# Patient Record
Sex: Male | Born: 1945 | Hispanic: No | Smoking: Never smoker
Health system: Southern US, Community
[De-identification: ages and names within clinical notes are randomized; demographics above are authoritative.]

## PROBLEM LIST (undated history)

## (undated) DIAGNOSIS — I219 Acute myocardial infarction, unspecified: Secondary | ICD-10-CM

## (undated) DIAGNOSIS — R339 Retention of urine, unspecified: Secondary | ICD-10-CM

## (undated) DIAGNOSIS — R7303 Prediabetes: Secondary | ICD-10-CM

## (undated) DIAGNOSIS — I10 Essential (primary) hypertension: Secondary | ICD-10-CM

## (undated) DIAGNOSIS — Z960 Presence of urogenital implants: Secondary | ICD-10-CM

## (undated) DIAGNOSIS — I639 Cerebral infarction, unspecified: Secondary | ICD-10-CM

## (undated) DIAGNOSIS — I2699 Other pulmonary embolism without acute cor pulmonale: Secondary | ICD-10-CM

## (undated) DIAGNOSIS — I679 Cerebrovascular disease, unspecified: Secondary | ICD-10-CM

## (undated) DIAGNOSIS — E119 Type 2 diabetes mellitus without complications: Secondary | ICD-10-CM

## (undated) DIAGNOSIS — E785 Hyperlipidemia, unspecified: Secondary | ICD-10-CM

## (undated) DIAGNOSIS — C61 Malignant neoplasm of prostate: Secondary | ICD-10-CM

## (undated) DIAGNOSIS — I447 Left bundle-branch block, unspecified: Secondary | ICD-10-CM

## (undated) DIAGNOSIS — Z978 Presence of other specified devices: Secondary | ICD-10-CM

## (undated) DIAGNOSIS — G459 Transient cerebral ischemic attack, unspecified: Secondary | ICD-10-CM

## (undated) DIAGNOSIS — Z8619 Personal history of other infectious and parasitic diseases: Secondary | ICD-10-CM

## (undated) DIAGNOSIS — D696 Thrombocytopenia, unspecified: Secondary | ICD-10-CM

## (undated) DIAGNOSIS — G9341 Metabolic encephalopathy: Secondary | ICD-10-CM

## (undated) DIAGNOSIS — R519 Headache, unspecified: Secondary | ICD-10-CM

## (undated) DIAGNOSIS — C641 Malignant neoplasm of right kidney, except renal pelvis: Secondary | ICD-10-CM

## (undated) HISTORY — DX: Hyperlipidemia, unspecified: E78.5

## (undated) HISTORY — DX: Metabolic encephalopathy: G93.41

## (undated) HISTORY — DX: Transient cerebral ischemic attack, unspecified: G45.9

## (undated) HISTORY — DX: Other pulmonary embolism without acute cor pulmonale: I26.99

## (undated) HISTORY — PX: NO PAST SURGERIES: SHX2092

## (undated) HISTORY — DX: Cerebrovascular disease, unspecified: I67.9

## (undated) HISTORY — PX: TONSILLECTOMY: SUR1361

---

## 2009-05-26 ENCOUNTER — Emergency Department (HOSPITAL_COMMUNITY): Admission: EM | Admit: 2009-05-26 | Discharge: 2009-05-26 | Payer: Self-pay | Admitting: Emergency Medicine

## 2009-05-27 ENCOUNTER — Emergency Department (HOSPITAL_COMMUNITY): Admission: EM | Admit: 2009-05-27 | Discharge: 2009-05-27 | Payer: Self-pay | Admitting: Emergency Medicine

## 2010-08-29 LAB — CBC
MCHC: 31.9 g/dL (ref 30.0–36.0)
RBC: 6.27 MIL/uL — ABNORMAL HIGH (ref 4.22–5.81)
WBC: 5.5 10*3/uL (ref 4.0–10.5)

## 2010-08-29 LAB — DIFFERENTIAL
Basophils Relative: 1 % (ref 0–1)
Lymphocytes Relative: 20 % (ref 12–46)
Monocytes Relative: 6 % (ref 3–12)
Neutro Abs: 4 10*3/uL (ref 1.7–7.7)
Neutrophils Relative %: 73 % (ref 43–77)

## 2010-08-29 LAB — URINALYSIS, ROUTINE W REFLEX MICROSCOPIC
Bilirubin Urine: NEGATIVE
Glucose, UA: NEGATIVE mg/dL
Ketones, ur: NEGATIVE mg/dL
Specific Gravity, Urine: 1.015 (ref 1.005–1.030)
pH: 7.5 (ref 5.0–8.0)

## 2010-08-29 LAB — POCT I-STAT, CHEM 8
Glucose, Bld: 129 mg/dL — ABNORMAL HIGH (ref 70–99)
HCT: 52 % (ref 39.0–52.0)
Hemoglobin: 17.7 g/dL — ABNORMAL HIGH (ref 13.0–17.0)
Potassium: 7.6 mEq/L (ref 3.5–5.1)
Sodium: 135 mEq/L (ref 135–145)

## 2010-08-29 LAB — URINE MICROSCOPIC-ADD ON

## 2014-11-30 ENCOUNTER — Emergency Department (HOSPITAL_COMMUNITY)
Admission: EM | Admit: 2014-11-30 | Discharge: 2014-11-30 | Disposition: A | Discharge status: Home / Self Care | Payer: Medicare Other | Attending: Emergency Medicine | Admitting: Emergency Medicine

## 2014-11-30 ENCOUNTER — Encounter (HOSPITAL_COMMUNITY): Payer: Self-pay | Admitting: Emergency Medicine

## 2014-11-30 DIAGNOSIS — R109 Unspecified abdominal pain: Secondary | ICD-10-CM | POA: Diagnosis not present

## 2014-11-30 DIAGNOSIS — I10 Essential (primary) hypertension: Secondary | ICD-10-CM | POA: Diagnosis not present

## 2014-11-30 DIAGNOSIS — R110 Nausea: Secondary | ICD-10-CM | POA: Insufficient documentation

## 2014-11-30 DIAGNOSIS — Z79899 Other long term (current) drug therapy: Secondary | ICD-10-CM | POA: Insufficient documentation

## 2014-11-30 DIAGNOSIS — R339 Retention of urine, unspecified: Secondary | ICD-10-CM | POA: Insufficient documentation

## 2014-11-30 DIAGNOSIS — R61 Generalized hyperhidrosis: Secondary | ICD-10-CM | POA: Diagnosis not present

## 2014-11-30 DIAGNOSIS — Z7982 Long term (current) use of aspirin: Secondary | ICD-10-CM | POA: Insufficient documentation

## 2014-11-30 HISTORY — DX: Essential (primary) hypertension: I10

## 2014-11-30 LAB — URINALYSIS, ROUTINE W REFLEX MICROSCOPIC
BILIRUBIN URINE: NEGATIVE
Glucose, UA: NEGATIVE mg/dL
KETONES UR: NEGATIVE mg/dL
Leukocytes, UA: NEGATIVE
NITRITE: NEGATIVE
SPECIFIC GRAVITY, URINE: 1.015 (ref 1.005–1.030)
UROBILINOGEN UA: 0.2 mg/dL (ref 0.0–1.0)
pH: 5.5 (ref 5.0–8.0)

## 2014-11-30 LAB — CBC WITH DIFFERENTIAL/PLATELET
BASOS PCT: 0 % (ref 0–1)
Basophils Absolute: 0 10*3/uL (ref 0.0–0.1)
EOS PCT: 0 % (ref 0–5)
Eosinophils Absolute: 0 10*3/uL (ref 0.0–0.7)
HCT: 46.3 % (ref 39.0–52.0)
HEMOGLOBIN: 15.6 g/dL (ref 13.0–17.0)
LYMPHS ABS: 0.9 10*3/uL (ref 0.7–4.0)
Lymphocytes Relative: 8 % — ABNORMAL LOW (ref 12–46)
MCH: 23.4 pg — AB (ref 26.0–34.0)
MCHC: 33.7 g/dL (ref 30.0–36.0)
MCV: 69.3 fL — AB (ref 78.0–100.0)
Monocytes Absolute: 0.5 10*3/uL (ref 0.1–1.0)
Monocytes Relative: 5 % (ref 3–12)
NEUTROS ABS: 9.5 10*3/uL — AB (ref 1.7–7.7)
NEUTROS PCT: 87 % — AB (ref 43–77)
PLATELETS: 203 10*3/uL (ref 150–400)
RBC: 6.68 MIL/uL — ABNORMAL HIGH (ref 4.22–5.81)
RDW: 14.2 % (ref 11.5–15.5)
WBC: 10.9 10*3/uL — ABNORMAL HIGH (ref 4.0–10.5)

## 2014-11-30 LAB — URINE MICROSCOPIC-ADD ON

## 2014-11-30 LAB — BASIC METABOLIC PANEL
Anion gap: 14 (ref 5–15)
BUN: 12 mg/dL (ref 6–20)
CO2: 21 mmol/L — ABNORMAL LOW (ref 22–32)
Calcium: 9.4 mg/dL (ref 8.9–10.3)
Chloride: 97 mmol/L — ABNORMAL LOW (ref 101–111)
Creatinine, Ser: 1.1 mg/dL (ref 0.61–1.24)
GFR calc Af Amer: >60 mL/min (ref >60)
Glucose, Bld: 182 mg/dL — ABNORMAL HIGH (ref 65–99)
POTASSIUM: 3.8 mmol/L (ref 3.5–5.1)
SODIUM: 132 mmol/L — AB (ref 135–145)

## 2014-11-30 NOTE — ED Notes (Signed)
Escorted patient to restroom.  Patient refused wheelchair.

## 2014-11-30 NOTE — ED Notes (Signed)
MD at bedside. Attempt bladder scan. Patient unable to lay flat for long periods of time do to increase urgency to urinate.

## 2014-11-30 NOTE — ED Notes (Addendum)
Pt st's he is having urinary retention,  St's has been drinking a lot of water but only urinates small amount.  Pt st's he last emptied his bladder yesterday.  Pt st's he has  Been out of B/P meds x's 5 days

## 2014-11-30 NOTE — Discharge Instructions (Signed)
Results for orders placed or performed during the hospital encounter of 11/30/14 (from the past 24 hour(s))  CBC with Differential     Status: Abnormal   Collection Time: 11/30/14  7:17 PM  Result Value Ref Range   WBC 10.9 (H) 4.0 - 10.5 K/uL   RBC 6.68 (H) 4.22 - 5.81 MIL/uL   Hemoglobin 15.6 13.0 - 17.0 g/dL   HCT 46.3 39.0 - 52.0 %   MCV 69.3 (L) 78.0 - 100.0 fL   MCH 23.4 (L) 26.0 - 34.0 pg   MCHC 33.7 30.0 - 36.0 g/dL   RDW 14.2 11.5 - 15.5 %   Platelets 203 150 - 400 K/uL   Neutrophils Relative % 87 (H) 43 - 77 %   Lymphocytes Relative 8 (L) 12 - 46 %   Monocytes Relative 5 3 - 12 %   Eosinophils Relative 0 0 - 5 %   Basophils Relative 0 0 - 1 %   Neutro Abs 9.5 (H) 1.7 - 7.7 K/uL   Lymphs Abs 0.9 0.7 - 4.0 K/uL   Monocytes Absolute 0.5 0.1 - 1.0 K/uL   Eosinophils Absolute 0.0 0.0 - 0.7 K/uL   Basophils Absolute 0.0 0.0 - 0.1 K/uL   Smear Review MORPHOLOGY UNREMARKABLE   Basic metabolic panel     Status: Abnormal   Collection Time: 11/30/14  7:17 PM  Result Value Ref Range   Sodium 132 (L) 135 - 145 mmol/L   Potassium 3.8 3.5 - 5.1 mmol/L   Chloride 97 (L) 101 - 111 mmol/L   CO2 21 (L) 22 - 32 mmol/L   Glucose, Bld 182 (H) 65 - 99 mg/dL   BUN 12 6 - 20 mg/dL   Creatinine, Ser 1.10 0.61 - 1.24 mg/dL   Calcium 9.4 8.9 - 10.3 mg/dL   GFR calc non Af Amer >60 >60 mL/min   GFR calc Af Amer >60 >60 mL/min   Anion gap 14 5 - 15  Urinalysis, Routine w reflex microscopic (not at [facility])     Status: Abnormal   Collection Time: 11/30/14  7:42 PM  Result Value Ref Range   Color, Urine YELLOW YELLOW   APPearance CLEAR CLEAR   Specific Gravity, Urine 1.015 1.005 - 1.030   pH 5.5 5.0 - 8.0   Glucose, UA NEGATIVE NEGATIVE mg/dL   Hgb urine dipstick LARGE (A) NEGATIVE   Bilirubin Urine NEGATIVE NEGATIVE   Ketones, ur NEGATIVE NEGATIVE mg/dL   Protein, ur >300 (A) NEGATIVE mg/dL   Urobilinogen, UA 0.2 0.0 - 1.0 mg/dL   Nitrite NEGATIVE NEGATIVE   Leukocytes, UA NEGATIVE  NEGATIVE  Urine microscopic-add on     Status: None   Collection Time: 11/30/14  7:42 PM  Result Value Ref Range   Squamous Epithelial / LPF RARE RARE   WBC, UA 0-2 <3 WBC/hpf   RBC / HPF 21-50 <3 RBC/hpf   Bacteria, UA RARE RARE   Urine-Other AMORPHOUS URATES/PHOSPHATES       Acute Urinary Retention Acute urinary retention is the temporary inability to urinate. This is a common problem in older men. As men age their prostates become larger and block the flow of urine from the bladder. This is usually a problem that has come on gradually.  HOME CARE INSTRUCTIONS If you are sent home with a Foley catheter and a drainage system, you will need to discuss the best course of action with your health care provider. While the catheter is in, maintain a good intake of  fluids. Keep the drainage bag emptied and lower than your catheter. This is so that contaminated urine will not flow back into your bladder, which could lead to a urinary tract infection. There are two main types of drainage bags. One is a large bag that usually is used at night. It has a good capacity that will allow you to sleep through the night without having to empty it. The second type is called a leg bag. It has a smaller capacity, so it needs to be emptied more frequently. However, the main advantage is that it can be attached by a leg strap and can go underneath your clothing, allowing you the freedom to move about or leave your home. Only take over-the-counter or prescription medicines for pain, discomfort, or fever as directed by your health care provider.  SEEK MEDICAL CARE IF:  You develop a low-grade fever.  You experience spasms or leakage of urine with the spasms. SEEK IMMEDIATE MEDICAL CARE IF:   You develop chills or fever.  Your catheter stops draining urine.  Your catheter falls out.  You start to develop increased bleeding that does not respond to rest and increased fluid intake. MAKE SURE  YOU:  Understand these instructions.  Will watch your condition.  Will get help right away if you are not doing well or get worse. Document Released: 08/21/2000 Document Revised: 05/20/2013 Document Reviewed: 10/24/2012 [facility] Patient Information 2015 [place], [place]. This information is not intended to replace advice given to you by your health care provider. Make sure you discuss any questions you have with your health care provider.

## 2014-11-30 NOTE — ED Notes (Signed)
Pt given wheelchair by this RN and escorted to triage waiting area by Idalia Needle, EMT Pt refused wheelchair and states he feels better standing up. This RN explained to pt that due to stating he was dizzy upon arrival that wheelchair was safest option. Pt got up out of wheelchair and ambulated around waiting area still.

## 2014-11-30 NOTE — ED Notes (Signed)
Bladder scan performed with a potential of 783 cc or more of urine retention.

## 2014-11-30 NOTE — ED Provider Notes (Signed)
CSN: [id number]     Arrival date & time 11/30/14  1752 History   First MD Initiated Contact with Patient 11/30/14 1810     Chief Complaint  Patient presents with  . Urinary Retention    (Consider location/radiation/quality/duration/timing/severity/associated sxs/prior Treatment) Patient is a 69 y.o. male presenting with dysuria. The history is provided by the patient.  Dysuria This is a new (inability to void) problem. The current episode started today. The problem occurs constantly. The problem has been unchanged. Associated symptoms include abdominal pain, chills, diaphoresis, nausea and urinary symptoms. Pertinent negatives include no arthralgias, change in bowel habit, chest pain, congestion, coughing, fatigue, fever, headaches, myalgias, numbness, visual change or vomiting. Nothing aggravates the symptoms. He has tried nothing for the symptoms.    Past Medical History  Diagnosis Date  . Hypertension    History reviewed. No pertinent past surgical history. No family history on file. History  Substance Use Topics  . Smoking status: Never Smoker   . Smokeless tobacco: Not on file  . Alcohol Use: No    Review of Systems  Constitutional: Positive for chills and diaphoresis. Negative for fever and fatigue.  HENT: Negative for congestion.   Respiratory: Negative for cough, chest tightness and shortness of breath.   Cardiovascular: Negative for chest pain.  Gastrointestinal: Positive for nausea, abdominal pain and abdominal distention. Negative for vomiting and change in bowel habit.  Genitourinary: Positive for decreased urine volume and difficulty urinating. Negative for urgency, frequency, hematuria, flank pain, discharge, penile swelling, penile pain and testicular pain.  Musculoskeletal: Negative for myalgias and arthralgias.  Neurological: Negative for light-headedness, numbness and headaches.  Psychiatric/Behavioral: Negative for confusion.  All other systems reviewed and are  negative.     Allergies  Review of patient's allergies indicates no known allergies.  Home Medications   Prior to Admission medications   Medication Sig Start Date End Date Taking? Authorizing Provider  amLODipine (NORVASC) 10 MG tablet Take 5 mg by mouth daily.   Yes Historical Provider, MD  aspirin EC 81 MG tablet Take 162 mg by mouth daily.   Yes Historical Provider, MD  hydrochlorothiazide (HYDRODIURIL) 25 MG tablet Take 25 mg by mouth daily.   Yes Historical Provider, MD  Multiple Vitamin (MULTIVITAMIN WITH MINERALS) TABS tablet Take 1 tablet by mouth daily.   Yes Historical Provider, MD   BP 183/113 mmHg  Pulse 112  Temp(Src) 98 F (36.7 C) (Oral)  Resp 20  Ht 6' (1.829 m)  Wt 210 lb (95.255 kg)  BMI 28.47 kg/m2  SpO2 98%  Filed Vitals:   11/30/14 2045 11/30/14 2100 11/30/14 2108 11/30/14 2211  BP: 170/95 171/93 179/95 179/89  Pulse: 94 92    Temp:    98.3 F (36.8 C)  TempSrc:   Oral   Resp: 18 9 22    Height:      Weight:      SpO2: 96% 97% 99%       Physical Exam  Constitutional: He is oriented to person, place, and time. He appears well-developed and well-nourished. No distress.  HENT:  Head: Normocephalic and atraumatic.  Nose: Nose normal.  Mouth/Throat: Oropharynx is clear and moist. No oropharyngeal exudate.  Eyes: EOM are normal. Pupils are equal, round, and reactive to light.  Neck: Normal range of motion. Neck supple.  Cardiovascular: Normal rate, regular rhythm, normal heart sounds and intact distal pulses.   No murmur heard. Pulmonary/Chest: Effort normal and breath sounds normal. No respiratory distress. He has no wheezes.  He exhibits no tenderness.  Abdominal: Soft. He exhibits distension (lower abd). There is tenderness. There is no rebound and no guarding.  Bladder palpable just under umbilicus and tender.  Upper abdomen soft and nondistended.  No CVA tenderness b/l.    Genitourinary: Prostate normal and penis normal. No penile tenderness.   Normal male circumcised penis.  No lesions, nontender.  Normal testicular lie.   Musculoskeletal: Normal range of motion. He exhibits no tenderness.  Neurological: He is alert and oriented to person, place, and time. No cranial nerve deficit. Coordination normal.  Skin: Skin is warm and dry. He is not diaphoretic. No pallor.  Psychiatric: He has a normal mood and affect. His behavior is normal. Judgment and thought content normal.  Nursing note and vitals reviewed.   ED Course  Procedures (including critical care time) Labs Review Labs Reviewed  URINALYSIS, ROUTINE W REFLEX MICROSCOPIC (NOT AT [facility]) - Abnormal; Notable for the following:    Hgb urine dipstick LARGE (*)    Protein, ur >300 (*)    All other components within normal limits  CBC WITH DIFFERENTIAL/PLATELET - Abnormal; Notable for the following:    WBC 10.9 (*)    RBC 6.68 (*)    MCV 69.3 (*)    MCH 23.4 (*)    Neutrophils Relative % 87 (*)    Lymphocytes Relative 8 (*)    Neutro Abs 9.5 (*)    All other components within normal limits  BASIC METABOLIC PANEL - Abnormal; Notable for the following:    Sodium 132 (*)    Chloride 97 (*)    CO2 21 (*)    Glucose, Bld 182 (*)    All other components within normal limits  URINE CULTURE  URINE MICROSCOPIC-ADD ON    Imaging Review No results found.   EKG Interpretation   Date/Time:  Monday November 30 2014 18:36:02 EDT Ventricular Rate:  102 PR Interval:  203 QRS Duration: 145 QT Interval:  367 QTC Calculation: 478 R Axis:   -71 Text Interpretation:  Sinus tachycardia Probable left atrial enlargement  Left bundle branch block Inferior infarct, acute Lateral leads are also  involved No significant change since last tracing Confirmed by Maryan Rued   MD, Loree Fee ([id number]) on 11/30/2014 6:41:40 PM      MDM   Final diagnoses:  Urinary retention    Pt is a 69 yo M with hx of HTN who presents with urinary retention.  No hx of UTIs, STDs, or prostate pathology in the  past.  Has had intermittent urinary retention in the past that lasted for a few hours but never prolonged.  Now has not urinated in 24 hours despite the sensation of needing to urinate and lower abdominal distension.  Has had a small amount of urinary incontinence into his underwear this afternoon.  Denies nausea, vomiting, fevers, myalgias.   Bladder palpable to almost his umbilicus.  Tender and distended inferior abdomen.   Prostate exam benign.   UA with no signs of UTI.   Patient tried to urinate but was still unable, so a foley catheter was placed.  > 1L of urine was voided immediately.  Patient felt much improved immediately after.  His hypertension improved, he was no longer tachycardic or tahcypneic, and abdominal pain resolved.  He was taught how to use the bag and instructed to f/u with PCP in 1 week.  All questions were answered and ED return precautions were discussed prior to dc home in stable condition.  If performed, labs, EKGs, and imaging were reviewed and interpreted by myself and my attending, and incorporated in the medical decision making.  Patient was seen with ED Attending, Dr. Dorma Russell, MD   Tori Milks, MD 12/02/14 [place], MD 12/03/14 1538

## 2014-12-02 ENCOUNTER — Emergency Department (HOSPITAL_COMMUNITY)
Admission: EM | Admit: 2014-12-02 | Discharge: 2014-12-02 | Disposition: A | Discharge status: Home / Self Care | Payer: BLUE CROSS/BLUE SHIELD | Attending: Emergency Medicine | Admitting: Emergency Medicine

## 2014-12-02 ENCOUNTER — Encounter (HOSPITAL_COMMUNITY): Payer: Self-pay | Admitting: Cardiology

## 2014-12-02 DIAGNOSIS — R339 Retention of urine, unspecified: Secondary | ICD-10-CM | POA: Diagnosis not present

## 2014-12-02 DIAGNOSIS — Z79899 Other long term (current) drug therapy: Secondary | ICD-10-CM | POA: Insufficient documentation

## 2014-12-02 DIAGNOSIS — R3919 Other difficulties with micturition: Secondary | ICD-10-CM | POA: Insufficient documentation

## 2014-12-02 DIAGNOSIS — I10 Essential (primary) hypertension: Secondary | ICD-10-CM | POA: Diagnosis not present

## 2014-12-02 DIAGNOSIS — Z7982 Long term (current) use of aspirin: Secondary | ICD-10-CM | POA: Insufficient documentation

## 2014-12-02 DIAGNOSIS — Z466 Encounter for fitting and adjustment of urinary device: Secondary | ICD-10-CM | POA: Diagnosis present

## 2014-12-02 LAB — URINE CULTURE: Culture: NO GROWTH

## 2014-12-02 NOTE — ED Notes (Signed)
Pt reports he was here 4th of July and had a foley placed. States he wants the foley removed and needs a urology appt made.

## 2014-12-02 NOTE — ED Notes (Signed)
Pt here for removal of Foley cath which was placed 11/30/14 in the ED. Went to [place] for follow up yesterday. They did not want to remove it since it had been put in here.

## 2014-12-02 NOTE — ED Provider Notes (Signed)
CSN: [id number]     Arrival date & time 12/02/14  1355 History  This chart was scribed for non-physician practitioner,Hope M. Janit Bern, NP, working with Pattricia Boss, MD, by Helane Gunther ED Scribe. This patient was seen in room TR03C/TR03C and the patient's care was started at 2:31 PM    Chief Complaint  Patient presents with  . remove foley    remove foley   The history is provided by the patient. No language interpreter was used.   HPI Comments: John Shields is a 69 y.o. male who presents to the Emergency Department to have his foley catheter removed. He reports coming in on 10/31/2014 when he was unable to void, and received a catheter, which provided immediate relief. He denies receiving any medications.   Past Medical History  Diagnosis Date  . Hypertension    History reviewed. No pertinent past surgical history. History reviewed. No pertinent family history. History  Substance Use Topics  . Smoking status: Never Smoker   . Smokeless tobacco: Not on file  . Alcohol Use: No    Review of Systems  Constitutional: Negative for fever.  Genitourinary: Positive for difficulty urinating.  Skin: Negative for wound.  All other systems reviewed and are negative.   Allergies  Review of patient's allergies indicates no known allergies.  Home Medications   Prior to Admission medications   Medication Sig Start Date End Date Taking? Authorizing Provider  amLODipine (NORVASC) 10 MG tablet Take 5 mg by mouth daily.    Historical Provider, MD  aspirin EC 81 MG tablet Take 162 mg by mouth daily.    Historical Provider, MD  hydrochlorothiazide (HYDRODIURIL) 25 MG tablet Take 25 mg by mouth daily.    Historical Provider, MD  Multiple Vitamin (MULTIVITAMIN WITH MINERALS) TABS tablet Take 1 tablet by mouth daily.    Historical Provider, MD   BP 136/89 mmHg  Pulse 95  Temp(Src) 98.1 F (36.7 C) (Oral)  Resp 16  Wt 210 lb (95.255 kg)  SpO2 95% Physical Exam  Constitutional: He is  oriented to person, place, and time. He appears well-developed and well-nourished. No distress.  HENT:  Head: Normocephalic and atraumatic.  Mouth/Throat: Oropharynx is clear and moist.  Eyes: EOM are normal.  Neck: Neck supple. No tracheal deviation present.  Cardiovascular: Normal rate.   Pulmonary/Chest: Effort normal and breath sounds normal.  Musculoskeletal: Normal range of motion.  Neurological: He is alert and oriented to person, place, and time.  Psychiatric: He has a normal mood and affect. His behavior is normal.  Nursing note and vitals reviewed.   ED Course  Procedures  DIAGNOSTIC STUDIES: Oxygen Saturation is 95% on RA, adequate by my interpretation.    COORDINATION OF CARE: 2:34 PM - Discussed plans to refer to a Urologist. Pt advised of plan for treatment and pt agrees. Dr. Jeanell Sparrow in to see the patient and discuss plan of care.  2:44 PM - Discussed plans to wait on catheter removal until pt has seen a urologist with Dr Jeanell Sparrow and pt. Pt advised of plan, and pt agrees. Appointment made for patient with [facility] and patient will leave catheter in until that time.   MDM  69 y.o. male with urinary retention and foley catheter in place. Stable for d/c to follow up with urology.  Final diagnoses:  Urinary retention   I personally performed the services described in this documentation, which was scribed in my presence. The recorded information has been reviewed and is accurate.  [address] [place], [place] 12/03/14 [place], MD 12/04/14 [phone]

## 2014-12-06 ENCOUNTER — Encounter (HOSPITAL_COMMUNITY): Payer: Self-pay | Admitting: Emergency Medicine

## 2014-12-06 ENCOUNTER — Emergency Department (HOSPITAL_COMMUNITY)
Admission: EM | Admit: 2014-12-06 | Discharge: 2014-12-07 | Disposition: A | Discharge status: Home / Self Care | Payer: BLUE CROSS/BLUE SHIELD | Attending: Emergency Medicine | Admitting: Emergency Medicine

## 2014-12-06 DIAGNOSIS — Z7982 Long term (current) use of aspirin: Secondary | ICD-10-CM | POA: Diagnosis not present

## 2014-12-06 DIAGNOSIS — R319 Hematuria, unspecified: Secondary | ICD-10-CM | POA: Diagnosis not present

## 2014-12-06 DIAGNOSIS — I10 Essential (primary) hypertension: Secondary | ICD-10-CM | POA: Diagnosis not present

## 2014-12-06 DIAGNOSIS — Z79899 Other long term (current) drug therapy: Secondary | ICD-10-CM | POA: Diagnosis not present

## 2014-12-06 NOTE — ED Notes (Signed)
Rob, [place], states pt not appropriate for fast track .  Per Raquel Sarna, charge RN, place pt in waiting room for acute room.

## 2014-12-06 NOTE — ED Notes (Signed)
Pt states he had foley placed here on 7/4 and is suppose to follow-up with urologist tomorrow.  Reports it will no longer drain due to blood clots.  Pt states he is leaking urine/blood from around foley.

## 2014-12-07 DIAGNOSIS — R310 Gross hematuria: Secondary | ICD-10-CM | POA: Diagnosis not present

## 2014-12-07 DIAGNOSIS — N3001 Acute cystitis with hematuria: Secondary | ICD-10-CM | POA: Diagnosis not present

## 2014-12-07 DIAGNOSIS — K921 Melena: Secondary | ICD-10-CM | POA: Diagnosis not present

## 2014-12-07 NOTE — ED Provider Notes (Signed)
CSN: [id number]     Arrival date & time 12/06/14  2208 History  This chart was scribed for John Schmidt, MD by Julien Nordmann, ED Scribe. This patient was seen in room A07C/A07C and the patient's care was started at 1:10 AM.    Chief Complaint  Patient presents with  . Hematuria       The history is provided by the patient. No language interpreter was used.   HPI Comments: John Shields is a 69 y.o. male who presents to the Emergency Department complaining of gradual worsening hematuria onset 6 hours ago. He has associated "tip" sensitivity. Pt has had a foley catheter for the past week due to not being able to pass much urine. Pt says he started dripping again today which prompted him to come to the ED before having hematuria. He notes having a follow up appointment with a urologist tomorrow. Pt denies distension, fevers, chills, nausea, and vomiting.  Past Medical History  Diagnosis Date  . Hypertension    History reviewed. No pertinent past surgical history. No family history on file. History  Substance Use Topics  . Smoking status: Never Smoker   . Smokeless tobacco: Not on file  . Alcohol Use: No    Review of Systems  A complete 10 system review of systems was obtained and all systems are negative except as noted in the HPI and PMH.    Allergies  Review of patient's allergies indicates no known allergies.  Home Medications   Prior to Admission medications   Medication Sig Start Date End Date Taking? Authorizing Provider  amLODipine (NORVASC) 10 MG tablet Take 5 mg by mouth daily.    Historical Provider, MD  aspirin EC 81 MG tablet Take 162 mg by mouth daily.    Historical Provider, MD  hydrochlorothiazide (HYDRODIURIL) 25 MG tablet Take 25 mg by mouth daily.    Historical Provider, MD  Multiple Vitamin (MULTIVITAMIN WITH MINERALS) TABS tablet Take 1 tablet by mouth daily.    Historical Provider, MD   Triage vitals: BP 174/103 mmHg  Pulse 80  Temp(Src) 98.2 F (36.8  C) (Oral)  Resp 18  SpO2 100% Physical Exam  Constitutional: He is oriented to person, place, and time. He appears well-developed and well-nourished.  HENT:  Head: Normocephalic and atraumatic.  Eyes: EOM are normal.  Neck: Normal range of motion.  Cardiovascular: Normal rate, regular rhythm, normal heart sounds and intact distal pulses.   Pulmonary/Chest: Effort normal and breath sounds normal. No respiratory distress.  Abdominal: Soft. He exhibits no distension. There is no tenderness.  Genitourinary:  Normal circumcised penis.  Small amount of blood at the meatus.  64 French catheter in place and it appears clotted with blood.  Hematuria noted in catheter bag  Musculoskeletal: Normal range of motion.  Neurological: He is alert and oriented to person, place, and time.  Skin: Skin is warm and dry.  Psychiatric: He has a normal mood and affect. Judgment normal.  Nursing note and vitals reviewed.   ED Course  Procedures  DIAGNOSTIC STUDIES: Oxygen Saturation is 100% on RA, normal by my interpretation.  COORDINATION OF CARE: 1:14 AM Discussed treatment plan which includes removal of catheter, urinate with pt at bedside and pt agreed to plan. 2:10 AM Pt urinated and would like to go home. Pt is to follow up with his urologist tomorrow.  Labs Review Labs Reviewed - No data to display  Imaging Review No results found.   EKG Interpretation None  MDM   Final diagnoses:  Hematuria    Patient prefers to have his catheter out at this time.  I suspect that his catheters obstructed.  He had urinary retention last week.  Has not had his catheter in for 1 week.  Catheter was removed at the bedside.  Patient was kept in the emergency department.  He drank fluids.  His been able to urinate freely on his own.  He stills having some hematuria.  He understands he has the potential to clot off his bladder and have recurrent urinary retention.  He will return to the ER if this is the  case.  Otherwise he will follow-up with urology in the morning as scheduled.  No fevers or chills.  No dysuria.  Doubt urinary tract infection.  I personally performed the services described in this documentation, which was scribed in my presence. The recorded information has been reviewed and is accurate.     John Schmidt, MD 12/07/14 [phone]

## 2014-12-07 NOTE — ED Notes (Signed)
Pt given ice water and urinal and encouraged to urinate

## 2014-12-14 DIAGNOSIS — R339 Retention of urine, unspecified: Secondary | ICD-10-CM | POA: Diagnosis not present

## 2014-12-21 DIAGNOSIS — R339 Retention of urine, unspecified: Secondary | ICD-10-CM | POA: Diagnosis not present

## 2014-12-22 DIAGNOSIS — R300 Dysuria: Secondary | ICD-10-CM | POA: Diagnosis not present

## 2014-12-22 DIAGNOSIS — R350 Frequency of micturition: Secondary | ICD-10-CM | POA: Diagnosis not present

## 2014-12-22 DIAGNOSIS — R339 Retention of urine, unspecified: Secondary | ICD-10-CM | POA: Diagnosis not present

## 2014-12-22 DIAGNOSIS — R312 Other microscopic hematuria: Secondary | ICD-10-CM | POA: Diagnosis not present

## 2015-01-05 DIAGNOSIS — R339 Retention of urine, unspecified: Secondary | ICD-10-CM | POA: Diagnosis not present

## 2015-01-05 DIAGNOSIS — R350 Frequency of micturition: Secondary | ICD-10-CM | POA: Diagnosis not present

## 2015-01-05 DIAGNOSIS — R312 Other microscopic hematuria: Secondary | ICD-10-CM | POA: Diagnosis not present

## 2015-04-02 DIAGNOSIS — R350 Frequency of micturition: Secondary | ICD-10-CM | POA: Diagnosis not present

## 2015-04-02 DIAGNOSIS — R339 Retention of urine, unspecified: Secondary | ICD-10-CM | POA: Diagnosis not present

## 2015-04-09 DIAGNOSIS — R3915 Urgency of urination: Secondary | ICD-10-CM | POA: Diagnosis not present

## 2015-04-09 DIAGNOSIS — R350 Frequency of micturition: Secondary | ICD-10-CM | POA: Diagnosis not present

## 2015-04-09 DIAGNOSIS — N401 Enlarged prostate with lower urinary tract symptoms: Secondary | ICD-10-CM | POA: Diagnosis not present

## 2015-04-09 DIAGNOSIS — R972 Elevated prostate specific antigen [PSA]: Secondary | ICD-10-CM | POA: Diagnosis not present

## 2015-08-27 ENCOUNTER — Emergency Department (HOSPITAL_COMMUNITY)
Admission: EM | Admit: 2015-08-27 | Discharge: 2015-08-27 | Disposition: A | Discharge status: Home / Self Care | Payer: Medicare Other | Attending: Emergency Medicine | Admitting: Emergency Medicine

## 2015-08-27 ENCOUNTER — Encounter (HOSPITAL_COMMUNITY): Payer: Self-pay | Admitting: *Deleted

## 2015-08-27 DIAGNOSIS — I10 Essential (primary) hypertension: Secondary | ICD-10-CM | POA: Diagnosis not present

## 2015-08-27 DIAGNOSIS — Z7982 Long term (current) use of aspirin: Secondary | ICD-10-CM | POA: Insufficient documentation

## 2015-08-27 DIAGNOSIS — R51 Headache: Secondary | ICD-10-CM | POA: Insufficient documentation

## 2015-08-27 DIAGNOSIS — Z79899 Other long term (current) drug therapy: Secondary | ICD-10-CM | POA: Insufficient documentation

## 2015-08-27 LAB — I-STAT CHEM 8, ED
BUN: 21 mg/dL — AB (ref 6–20)
CALCIUM ION: 1.15 mmol/L (ref 1.13–1.30)
CREATININE: 0.9 mg/dL (ref 0.61–1.24)
Chloride: 101 mmol/L (ref 101–111)
Glucose, Bld: 96 mg/dL (ref 65–99)
HCT: 53 % — ABNORMAL HIGH (ref 39.0–52.0)
Hemoglobin: 18 g/dL — ABNORMAL HIGH (ref 13.0–17.0)
Potassium: 4.4 mmol/L (ref 3.5–5.1)
Sodium: 140 mmol/L (ref 135–145)
TCO2: 29 mmol/L (ref 0–100)

## 2015-08-27 MED ORDER — CLONIDINE HCL 0.1 MG PO TABS: 0.1000 mg | ORAL_TABLET | Freq: Every day | ORAL | Status: DC

## 2015-08-27 MED ORDER — CLONIDINE HCL 0.2 MG PO TABS
0.3000 mg | ORAL_TABLET | Freq: Once | ORAL | Status: AC
  Administered 2015-08-27: 0.3 mg via ORAL
  Filled 2015-08-27: qty 1

## 2015-08-27 NOTE — Discharge Instructions (Signed)
DASH Eating Plan °DASH stands for "Dietary Approaches to Stop Hypertension." The DASH eating plan is a healthy eating plan that has been shown to reduce high blood pressure (hypertension). Additional health benefits may include reducing the risk of type 2 diabetes mellitus, heart disease, and stroke. The DASH eating plan may also help with weight loss. °WHAT DO I NEED TO KNOW ABOUT THE DASH EATING PLAN? °For the DASH eating plan, you will follow these general guidelines: °· Choose foods with a percent daily value for sodium of less than 5% (as listed on the food label). °· Use salt-free seasonings or herbs instead of table salt or sea salt. °· Check with your health care provider or pharmacist before using salt substitutes. °· Eat lower-sodium products, often labeled as "lower sodium" or "no salt added." °· Eat fresh foods. °· Eat more vegetables, fruits, and low-fat dairy products. °· Choose whole grains. Look for the word "whole" as the first word in the ingredient list. °· Choose fish and skinless chicken or turkey more often than red meat. Limit fish, poultry, and meat to 6 oz (170 g) each day. °· Limit sweets, desserts, sugars, and sugary drinks. °· Choose heart-healthy fats. °· Limit cheese to 1 oz (28 g) per day. °· Eat more home-cooked food and less restaurant, buffet, and fast food. °· Limit fried foods. °· Cook foods using methods other than frying. °· Limit canned vegetables. If you do use them, rinse them well to decrease the sodium. °· When eating at a restaurant, ask that your food be prepared with less salt, or no salt if possible. °WHAT FOODS CAN I EAT? °Seek help from a dietitian for individual calorie needs. °Grains °Whole grain or whole wheat bread. Brown rice. Whole grain or whole wheat pasta. Quinoa, bulgur, and whole grain cereals. Low-sodium cereals. Corn or whole wheat flour tortillas. Whole grain cornbread. Whole grain crackers. Low-sodium crackers. °Vegetables °Fresh or frozen vegetables  (raw, steamed, roasted, or grilled). Low-sodium or reduced-sodium tomato and vegetable juices. Low-sodium or reduced-sodium tomato sauce and paste. Low-sodium or reduced-sodium canned vegetables.  °Fruits °All fresh, canned (in natural juice), or frozen fruits. °Meat and Other Protein Products °Ground beef (85% or leaner), grass-fed beef, or beef trimmed of fat. Skinless chicken or turkey. Ground chicken or turkey. Pork trimmed of fat. All fish and seafood. Eggs. Dried beans, peas, or lentils. Unsalted nuts and seeds. Unsalted canned beans. °Dairy °Low-fat dairy products, such as skim or 1% milk, 2% or reduced-fat cheeses, low-fat ricotta or cottage cheese, or plain low-fat yogurt. Low-sodium or reduced-sodium cheeses. °Fats and Oils °Tub margarines without trans fats. Light or reduced-fat mayonnaise and salad dressings (reduced sodium). Avocado. Safflower, olive, or canola oils. Natural peanut or almond butter. °Other °Unsalted popcorn and pretzels. °The items listed above may not be a complete list of recommended foods or beverages. Contact your dietitian for more options. °WHAT FOODS ARE NOT RECOMMENDED? °Grains °White bread. White pasta. White rice. Refined cornbread. Bagels and croissants. Crackers that contain trans fat. °Vegetables °Creamed or fried vegetables. Vegetables in a cheese sauce. Regular canned vegetables. Regular canned tomato sauce and paste. Regular tomato and vegetable juices. °Fruits °Dried fruits. Canned fruit in light or heavy syrup. Fruit juice. °Meat and Other Protein Products °Fatty cuts of meat. Ribs, chicken wings, bacon, sausage, bologna, salami, chitterlings, fatback, hot dogs, bratwurst, and packaged luncheon meats. Salted nuts and seeds. Canned beans with salt. °Dairy °Whole or 2% milk, cream, half-and-half, and cream cheese. Whole-fat or sweetened yogurt. Full-fat   cheeses or blue cheese. Nondairy creamers and whipped toppings. Processed cheese, cheese spreads, or cheese  curds. °Condiments °Onion and garlic salt, seasoned salt, table salt, and sea salt. Canned and packaged gravies. Worcestershire sauce. Tartar sauce. Barbecue sauce. Teriyaki sauce. Soy sauce, including reduced sodium. Steak sauce. Fish sauce. Oyster sauce. Cocktail sauce. Horseradish. Ketchup and mustard. Meat flavorings and tenderizers. Bouillon cubes. Hot sauce. Tabasco sauce. Marinades. Taco seasonings. Relishes. °Fats and Oils °Butter, stick margarine, lard, shortening, ghee, and bacon fat. Coconut, palm kernel, or palm oils. Regular salad dressings. °Other °Pickles and olives. Salted popcorn and pretzels. °The items listed above may not be a complete list of foods and beverages to avoid. Contact your dietitian for more information. °WHERE CAN I FIND MORE INFORMATION? °[employer], [employer]: www.nhlbi.nih.gov/health/health-topics/topics/dash/ °  °This information is not intended to replace advice given to you by your health care provider. Make sure you discuss any questions you have with your health care provider. °  °Document Released: 05/04/2011 Document Revised: 06/05/2014 Document Reviewed: 03/19/2013 °Elsevier Interactive Patient Education ©2016 [employer] ° °Hypertension °Hypertension, commonly called high blood pressure, is when the force of blood pumping through your arteries is too strong. Your arteries are the blood vessels that carry blood from your heart throughout your body. A blood pressure reading consists of a higher number over a lower number, such as 110/72. The higher number (systolic) is the pressure inside your arteries when your heart pumps. The lower number (diastolic) is the pressure inside your arteries when your heart relaxes. Ideally you want your blood pressure below 120/80. °Hypertension forces your heart to work harder to pump blood. Your arteries may become narrow or stiff. Having untreated or uncontrolled hypertension can cause heart attack, stroke, kidney  disease, and other problems. °RISK FACTORS °Some risk factors for high blood pressure are controllable. Others are not.  °Risk factors you cannot control include:  °· Race. You may be at higher risk if you are African American. °· Age. Risk increases with age. °· Gender. Men are at higher risk than women before age 45 years. After age 65, women are at higher risk than men. °Risk factors you can control include: °· Not getting enough exercise or physical activity. °· Being overweight. °· Getting too much fat, sugar, calories, or salt in your diet. °· Drinking too much alcohol. °SIGNS AND SYMPTOMS °Hypertension does not usually cause signs or symptoms. Extremely high blood pressure (hypertensive crisis) may cause headache, anxiety, shortness of breath, and nosebleed. °DIAGNOSIS °To check if you have hypertension, your health care provider will measure your blood pressure while you are seated, with your arm held at the level of your heart. It should be measured at least twice using the same arm. Certain conditions can cause a difference in blood pressure between your right and left arms. A blood pressure reading that is higher than normal on one occasion does not mean that you need treatment. If it is not clear whether you have high blood pressure, you may be asked to return on a different day to have your blood pressure checked again. Or, you may be asked to monitor your blood pressure at home for 1 or more weeks. °TREATMENT °Treating high blood pressure includes making lifestyle changes and possibly taking medicine. Living a healthy lifestyle can help lower high blood pressure. You may need to change some of your habits. °Lifestyle changes may include: °· Following the DASH diet. This diet is high in fruits, vegetables, and whole   grains. It is low in salt, red meat, and added sugars. °· Keep your sodium intake below 2,300 mg per day. °· Getting at least 30-45 minutes of aerobic exercise at least 4 times per  week. °· Losing weight if necessary. °· Not smoking. °· Limiting alcoholic beverages. °· Learning ways to reduce stress. °Your health care provider may prescribe medicine if lifestyle changes are not enough to get your blood pressure under control, and if one of the following is true: °· You are 18-59 years of age and your systolic blood pressure is above 140. °· You are 60 years of age or older, and your systolic blood pressure is above 150. °· Your diastolic blood pressure is above 90. °· You have diabetes, and your systolic blood pressure is over 140 or your diastolic blood pressure is over 90. °· You have kidney disease and your blood pressure is above 140/90. °· You have heart disease and your blood pressure is above 140/90. °Your personal target blood pressure may vary depending on your medical conditions, your age, and other factors. °HOME CARE INSTRUCTIONS °· Have your blood pressure rechecked as directed by your health care provider.   °· Take medicines only as directed by your health care provider. Follow the directions carefully. Blood pressure medicines must be taken as prescribed. The medicine does not work as well when you skip doses. Skipping doses also puts you at risk for problems. °· Do not smoke.   °· Monitor your blood pressure at home as directed by your health care provider.  °SEEK MEDICAL CARE IF:  °· You think you are having a reaction to medicines taken. °· You have recurrent headaches or feel dizzy. °· You have swelling in your ankles. °· You have trouble with your vision. °SEEK IMMEDIATE MEDICAL CARE IF: °· You develop a severe headache or confusion. °· You have unusual weakness, numbness, or feel faint. °· You have severe chest or abdominal pain. °· You vomit repeatedly. °· You have trouble breathing. °MAKE SURE YOU:  °· Understand these instructions. °· Will watch your condition. °· Will get help right away if you are not doing well or get worse. °  °This information is not intended to  replace advice given to you by your health care provider. Make sure you discuss any questions you have with your health care provider. °  °Document Released: 05/15/2005 Document Revised: 09/29/2014 Document Reviewed: 03/07/2013 °Elsevier Interactive Patient Education ©2016 [employer] Inc. ° °

## 2015-08-27 NOTE — ED Provider Notes (Signed)
CSN: [id number]     Arrival date & time 08/27/15  1118 History   First MD Initiated Contact with Patient 08/27/15 1258     Chief Complaint  Patient presents with  . Hypertension      HPI Pt states he takes his bp every day. On the 23rd he noticed his bp's were increasing to 154/96. For the last 3 days he's been experiencing dizziness when he lays down. Denies changes in bp meds Past Medical History  Diagnosis Date  . Hypertension    History reviewed. No pertinent past surgical history. No family history on file. Social History  Substance Use Topics  . Smoking status: Never Smoker   . Smokeless tobacco: None  . Alcohol Use: No    Review of Systems  Cardiovascular: Negative for chest pain, palpitations and leg swelling.  Musculoskeletal: Negative for neck pain.  Neurological: Positive for headaches. Negative for numbness.  All other systems reviewed and are negative.     Allergies  Review of patient's allergies indicates no known allergies.  Home Medications   Prior to Admission medications   Medication Sig Start Date End Date Taking? Authorizing Provider  amLODipine (NORVASC) 10 MG tablet Take 5 mg by mouth daily.    Historical Provider, MD  aspirin EC 81 MG tablet Take 162 mg by mouth daily.    Historical Provider, MD  cloNIDine (CATAPRES) 0.1 MG tablet Take 1 tablet (0.1 mg total) by mouth daily. 08/27/15   Leonard Schwartz, MD  hydrochlorothiazide (HYDRODIURIL) 25 MG tablet Take 25 mg by mouth daily.    Historical Provider, MD  Multiple Vitamin (MULTIVITAMIN WITH MINERALS) TABS tablet Take 1 tablet by mouth daily.    Historical Provider, MD   BP 114/80 mmHg  Pulse 67  Temp(Src) 97.9 F (36.6 C) (Oral)  Resp 16  SpO2 96% Physical Exam Physical Exam  Nursing note and vitals reviewed. Constitutional: He is oriented to person, place, and time. He appears well-developed and well-nourished. No distress.  HENT:  Head: Normocephalic and atraumatic.  Eyes: Pupils are  equal, round, and reactive to light.  Neck: Normal range of motion.  Cardiovascular: Normal rate and intact distal pulses.   Pulmonary/Chest: No respiratory distress.  Abdominal: Normal appearance. He exhibits no distension.  Musculoskeletal: Normal range of motion.  Neurological: He is alert and oriented to person, place, and time. No cranial nerve deficit.  Skin: Skin is warm and dry. No rash noted.  Psychiatric: He has a normal mood and affect. His behavior is normal.   ED Course  Procedures (including critical care time) Medications  cloNIDine (CATAPRES) tablet 0.3 mg (0.3 mg Oral Given 08/27/15 1311)    Labs Review Labs Reviewed  I-STAT CHEM 8, ED - Abnormal; Notable for the following:    BUN 21 (*)    Hemoglobin 18.0 (*)    HCT 53.0 (*)    All other components within normal limits    Imaging Review No results found. I have personally reviewed and evaluated these images and lab results as part of my medical decision-making.  After treatment in the ED the patient feels back to baseline and wants to go home.  MDM   Final diagnoses:  Essential hypertension        Leonard Schwartz, MD 08/27/15 1544

## 2015-08-27 NOTE — ED Notes (Signed)
Patient able to dress and ambulate independently.  Signature pad in room not functioning.  Patient verbalized understanding of discharge instructions and new medication.  Pt verbalized understanding of follow-up recommendations.  All questions answered.

## 2015-08-27 NOTE — ED Notes (Addendum)
Pt states he takes his bp every day.  On the 23rd he noticed his bp's were increasing to 154/96.  For the last 3 days he's been experiencing dizziness when he lays down.  Denies changes in bp meds.

## 2015-08-27 NOTE — ED Notes (Signed)
Pt placed back on b/p cuff and pulse ox.  EDP advised have monitor set on every 15 mins for blood pressure.

## 2016-05-30 ENCOUNTER — Emergency Department (HOSPITAL_COMMUNITY)
Admission: EM | Admit: 2016-05-30 | Discharge: 2016-05-30 | Disposition: A | Discharge status: Home / Self Care | Payer: Medicare Other | Attending: Emergency Medicine | Admitting: Emergency Medicine

## 2016-05-30 ENCOUNTER — Encounter (HOSPITAL_COMMUNITY): Payer: Self-pay | Admitting: *Deleted

## 2016-05-30 DIAGNOSIS — Z79899 Other long term (current) drug therapy: Secondary | ICD-10-CM | POA: Diagnosis not present

## 2016-05-30 DIAGNOSIS — Z7982 Long term (current) use of aspirin: Secondary | ICD-10-CM | POA: Diagnosis not present

## 2016-05-30 DIAGNOSIS — R339 Retention of urine, unspecified: Secondary | ICD-10-CM | POA: Diagnosis not present

## 2016-05-30 DIAGNOSIS — I10 Essential (primary) hypertension: Secondary | ICD-10-CM | POA: Diagnosis not present

## 2016-05-30 LAB — URINALYSIS, ROUTINE W REFLEX MICROSCOPIC
Bacteria, UA: NONE SEEN
Bilirubin Urine: NEGATIVE
GLUCOSE, UA: 50 mg/dL — AB
Ketones, ur: NEGATIVE mg/dL
Leukocytes, UA: NEGATIVE
NITRITE: NEGATIVE
PH: 6 (ref 5.0–8.0)
Protein, ur: 100 mg/dL — AB
SPECIFIC GRAVITY, URINE: 1.013 (ref 1.005–1.030)
Squamous Epithelial / LPF: NONE SEEN

## 2016-05-30 NOTE — ED Provider Notes (Signed)
[facility] DEPT Provider Note   CSN: [id number] Arrival date & time: 05/30/16  1816     History   Chief Complaint Chief Complaint  Patient presents with  . Urinary Retention    HPI Leelynn Otero is a 71 y.o. male.  Patient is a 71 year old male with history of hypertension. He presents for evaluation of inability urinating and abdominal distention. This is worsened since this morning. He denies any fevers or chills. He does report some abdominal and low back discomfort. He reports a similar episode a couple years ago for which she required a Foley catheter. He was followed by urology, however nothing was found. He denies any history of prostate issues. He denies any back pain or leg numbness or weakness.   The history is provided by the patient.    Past Medical History:  Diagnosis Date  . Hypertension   . Urinary retention    pt reports this 2 years ago    There are no active problems to display for this patient.   History reviewed. No pertinent surgical history.     Home Medications    Prior to Admission medications   Medication Sig Start Date End Date Taking? Authorizing Provider  amLODipine (NORVASC) 10 MG tablet Take 5 mg by mouth daily.    Historical Provider, MD  aspirin EC 81 MG tablet Take 162 mg by mouth daily.    Historical Provider, MD  cloNIDine (CATAPRES) 0.1 MG tablet Take 1 tablet (0.1 mg total) by mouth daily. 08/27/15   Leonard Schwartz, MD  hydrochlorothiazide (HYDRODIURIL) 25 MG tablet Take 25 mg by mouth daily.    Historical Provider, MD  Multiple Vitamin (MULTIVITAMIN WITH MINERALS) TABS tablet Take 1 tablet by mouth daily.    Historical Provider, MD    Family History No family history on file.  Social History Social History  Substance Use Topics  . Smoking status: Never Smoker  . Smokeless tobacco: Never Used  . Alcohol use No     Allergies   Patient has no known allergies.   Review of Systems Review of Systems  All other  systems reviewed and are negative.    Physical Exam Updated Vital Signs BP 130/86 (BP Location: Right Arm)   Pulse 119   Temp 97.7 F (36.5 C) (Oral)   Resp 20   Ht 5\' 8"  (1.727 m)   Wt 185 lb (83.9 kg)   SpO2 97%   BMI 28.13 kg/m   Physical Exam  Constitutional: He is oriented to person, place, and time. He appears well-developed and well-nourished. No distress.  Patient appears somewhat uncomfortable.  HENT:  Head: Normocephalic and atraumatic.  Mouth/Throat: Oropharynx is clear and moist.  Neck: Normal range of motion. Neck supple.  Cardiovascular: Normal rate and regular rhythm.  Exam reveals no friction rub.   No murmur heard. Pulmonary/Chest: Effort normal and breath sounds normal. No respiratory distress. He has no wheezes. He has no rales.  Abdominal: Soft. Bowel sounds are normal. He exhibits no distension. There is no tenderness.  There is suprapubic fullness and tenderness.  Musculoskeletal: Normal range of motion. He exhibits no edema.  Neurological: He is alert and oriented to person, place, and time. Coordination normal.  Skin: Skin is warm and dry. He is not diaphoretic.  Nursing note and vitals reviewed.    ED Treatments / Results  Labs (all labs ordered are listed, but only abnormal results are displayed) Labs Reviewed  URINALYSIS, ROUTINE W REFLEX MICROSCOPIC    EKG  EKG Interpretation None       Radiology No results found.  Procedures Procedures (including critical care time)  Medications Ordered in ED Medications - No data to display   Initial Impression / Assessment and Plan / ED Course  I have reviewed the triage vital signs and the nursing notes.  Pertinent labs & imaging results that were available during my care of the patient were reviewed by me and considered in my medical decision making (see chart for details).  Clinical Course     Patient presents with urinary retention. A Foley catheter was placed by nursing staff and  1200 mL of clear yellow urine was obtained with resolution of his symptoms. The patient has been seen by [facility] in the past after a similar episode 18 months ago and I will advise him to see them again in follow-up.  Final Clinical Impressions(s) / ED Diagnoses   Final diagnoses:  None    New Prescriptions New Prescriptions   No medications on file     Veryl Speak, MD 05/30/16 2050

## 2016-05-30 NOTE — ED Notes (Signed)
Pt called this RN into the room to let me know the catheter insertion site was bleeding.  Site around the meatus of the penis has a small amount of red blood. To this RN it looked liked it was not an active bleed and was caused by the cathter moving up and down when the pt ambulated.  ED-P was made aware and inspected the pt.

## 2016-05-30 NOTE — Discharge Instructions (Signed)
Follow-up with [facility] in the next 2-3 days for Foley catheter removal.  Return to the emergency department if you develop worsening pain, high fever, bloody urine, or other new and concerning symptoms.

## 2016-05-30 NOTE — ED Notes (Signed)
Pt verbalized understanding discharge instructions and denies any further needs or questions at this time. VS stable, ambulatory and steady gait.   

## 2016-05-30 NOTE — ED Notes (Signed)
Unable to locate bladder scanner. Per Dr. Stark Jock, ok to place foley: patient not voided in 12 hours, extremely uncomfortable.

## 2016-05-30 NOTE — ED Triage Notes (Signed)
Pt is here for urinary retention.  Pt states that his pain is severe in his abdomen and legs.  Pt reports that he has not been able to void more than "a few drops at a time" since this am.  Pt reports hx of same about 2 years ago and needing a foley placed.  Pt is laying back in recliner. Reports HA and generally feeling unwell with pain

## 2016-05-30 NOTE — ED Notes (Signed)
Pt appears anxious and unwell.  Reports increasing pain.  Pt is in a recliner for comfort and will be moved to a room for foley placement as soon as possible

## 2016-05-30 NOTE — ED Notes (Signed)
Total urine collected from the pt since he has been in the ED has been 1900 mL. The pt was not able to be bladder scanned before procedure b/c the bladder scanner is currently broken.  Candise Bowens was the assisting EMT helping place the Foley cathter.  Pt has been fitted with a leg bag and given instruction on home care.

## 2016-06-04 ENCOUNTER — Encounter (HOSPITAL_COMMUNITY): Payer: Self-pay | Admitting: Emergency Medicine

## 2016-06-04 ENCOUNTER — Emergency Department (HOSPITAL_COMMUNITY)
Admission: EM | Admit: 2016-06-04 | Discharge: 2016-06-04 | Disposition: A | Discharge status: Home / Self Care | Payer: Medicare Other | Attending: Emergency Medicine | Admitting: Emergency Medicine

## 2016-06-04 DIAGNOSIS — R310 Gross hematuria: Secondary | ICD-10-CM | POA: Insufficient documentation

## 2016-06-04 DIAGNOSIS — R339 Retention of urine, unspecified: Secondary | ICD-10-CM | POA: Diagnosis not present

## 2016-06-04 DIAGNOSIS — I10 Essential (primary) hypertension: Secondary | ICD-10-CM | POA: Diagnosis not present

## 2016-06-04 DIAGNOSIS — Z79899 Other long term (current) drug therapy: Secondary | ICD-10-CM | POA: Diagnosis not present

## 2016-06-04 DIAGNOSIS — Z7982 Long term (current) use of aspirin: Secondary | ICD-10-CM | POA: Diagnosis not present

## 2016-06-04 LAB — URINALYSIS, ROUTINE W REFLEX MICROSCOPIC
BILIRUBIN URINE: NEGATIVE
Bacteria, UA: NONE SEEN
Glucose, UA: NEGATIVE mg/dL
Ketones, ur: NEGATIVE mg/dL
NITRITE: NEGATIVE
PH: 6 (ref 5.0–8.0)
Protein, ur: 100 mg/dL — AB
SPECIFIC GRAVITY, URINE: 1.018 (ref 1.005–1.030)
Squamous Epithelial / LPF: NONE SEEN

## 2016-06-04 MED ORDER — CEPHALEXIN 500 MG PO CAPS: 500.0000 mg | ORAL_CAPSULE | Freq: Three times a day (TID) | ORAL | 0 refills | Status: DC

## 2016-06-04 MED ORDER — CEPHALEXIN 250 MG PO CAPS
500.0000 mg | ORAL_CAPSULE | Freq: Once | ORAL | Status: AC
Start: 2016-06-04 — End: 2016-06-04
  Administered 2016-06-04: 500 mg via ORAL
  Filled 2016-06-04: qty 2

## 2016-06-04 NOTE — ED Provider Notes (Signed)
[facility] DEPT Provider Note   CSN: [id number] Arrival date & time: 06/04/16  1627  By signing my name below, I, John Shields, attest that this documentation has been prepared under the direction and in the presence of John Rice, MD. Electronically Signed: Gwenlyn Shields, ED Scribe. 06/04/16. 6:14 PM.   History   Chief Complaint Chief Complaint  Patient presents with  . foley removal   The history is provided by the patient. No language interpreter was used.   HPI Comments: John Shields is a 71 y.o. male who presents to the Emergency Department for Foley catheter removal following placement due to urinary retention. Pt states he has episodes of urinary retention approximately every 1.5 years. He had foley catheter placed 5 days ago when he was unable to urinate. Pt states urine gradually turns from yellow to red. He did not have any blood in his urine before the placement of the foley catheter. He states he feels irritation from the catheter while he is moving. Irritation is increased when he attempts to sit down. Pt denies fever, chills, abdominal pain.  Past Medical History:  Diagnosis Date  . Hypertension   . Urinary retention    pt reports this 2 years ago    There are no active problems to display for this patient.   History reviewed. No pertinent surgical history.   Home Medications    Prior to Admission medications   Medication Sig Start Date End Date Taking? Authorizing Provider  amLODipine (NORVASC) 10 MG tablet Take 5 mg by mouth daily.    Historical Provider, MD  aspirin EC 81 MG tablet Take 162 mg by mouth daily.    Historical Provider, MD  cephALEXin (KEFLEX) 500 MG capsule Take 1 capsule (500 mg total) by mouth 3 (three) times daily. 06/04/16   John Rice, MD  cloNIDine (CATAPRES) 0.1 MG tablet Take 1 tablet (0.1 mg total) by mouth daily. 08/27/15   Leonard Schwartz, MD  hydrochlorothiazide (HYDRODIURIL) 25 MG tablet Take 25 mg by mouth daily.     Historical Provider, MD  Multiple Vitamin (MULTIVITAMIN WITH MINERALS) TABS tablet Take 1 tablet by mouth daily.    Historical Provider, MD    Family History No family history on file.  Social History Social History  Substance Use Topics  . Smoking status: Never Smoker  . Smokeless tobacco: Never Used  . Alcohol use No     Allergies   Patient has no known allergies.   Review of Systems Review of Systems  Constitutional: Negative for chills and fever.  Gastrointestinal: Negative for abdominal pain, nausea and vomiting.  Genitourinary: Positive for difficulty urinating and hematuria. Negative for dysuria, flank pain and penile pain.  Skin: Positive for wound. Negative for rash.  All other systems reviewed and are negative.    Physical Exam Updated Vital Signs BP 124/89 (BP Location: Right Arm)   Pulse 112   Temp 98.4 F (36.9 C) (Oral)   Resp 18   SpO2 97%   Physical Exam  Constitutional: He is oriented to person, place, and time. He appears well-developed and well-nourished.  HENT:  Head: Normocephalic and atraumatic.  Mouth/Throat: Oropharynx is clear and moist.  Eyes: EOM are normal. Pupils are equal, round, and reactive to light.  Neck: Normal range of motion. Neck supple.  Cardiovascular: Normal rate and regular rhythm.   Pulmonary/Chest: Effort normal and breath sounds normal.  Abdominal: Soft. Bowel sounds are normal. There is no tenderness. There is no rebound and no guarding.  No suprapubic tenderness or distention.  Genitourinary:  Genitourinary Comments: Foley catheter in place. Grossly bloody urine in leg bag. No bleeding around the insertion site  Musculoskeletal: Normal range of motion. He exhibits no edema or tenderness.  Neurological: He is alert and oriented to person, place, and time.  Skin: Skin is warm and dry. No rash noted. No erythema.  Psychiatric: He has a normal mood and affect. His behavior is normal.  Nursing note and vitals  reviewed.    ED Treatments / Results  DIAGNOSTIC STUDIES: Oxygen Saturation is 97% on RA, normal by my interpretation.    COORDINATION OF CARE: 6:08 PM Discussed treatment plan with pt at bedside which includes removal of foley catheter and check for urinary retention and pt agreed to plan. Discussed with patient the need to replace foley catheter if pt was unable to empty his bladder.  Labs (all labs ordered are listed, but only abnormal results are displayed) Labs Reviewed  URINE CULTURE  URINALYSIS, ROUTINE W REFLEX MICROSCOPIC    EKG  EKG Interpretation None       Radiology No results found.  Procedures Procedures (including critical care time)  Medications Ordered in ED Medications  cephALEXin (KEFLEX) capsule 500 mg (not administered)     Initial Impression / Assessment and Plan / ED Course  I have reviewed the triage vital signs and the nursing notes.  Pertinent labs & imaging results that were available during my care of the patient were reviewed by me and considered in my medical decision making (see chart for details).  Clinical Course    Patient was adamant about having Foley catheter taken out. He has not followed up with urology. Remove Foley catheter. Patient was having difficulty urinating. Presents for a small amount of grossly bloody urine. Urine. Bedside ultrasound with moderate amount of urine in the bladder. Full catheter will be replaced the patient be started on antibiotics for presumed urinary tract infection. Urine was sent for culture. Patient understands the need to follow-up with urology. Return precautions have been given.  Final Clinical Impressions(s) / ED Diagnoses   Final diagnoses:  Hematuria, gross  Urinary retention    New Prescriptions New Prescriptions   CEPHALEXIN (KEFLEX) 500 MG CAPSULE    Take 1 capsule (500 mg total) by mouth 3 (three) times daily.  I personally performed the services described in this documentation,  which was scribed in my presence. The recorded information has been reviewed and is accurate.      John Rice, MD 06/04/16 2055

## 2016-06-04 NOTE — ED Triage Notes (Signed)
Pt provided with urine spec. Cup and PT  voices understanding to collect urine sample in spec. Cup.

## 2016-06-04 NOTE — ED Notes (Signed)
Foley was not placed. Patient refused procedure.

## 2016-06-04 NOTE — ED Triage Notes (Signed)
Pt request foley to be removed.  States it was placed 5 days ago and he was told to return to the ED when he wanted it removed.

## 2016-06-06 LAB — URINE CULTURE

## 2016-06-07 ENCOUNTER — Observation Stay (HOSPITAL_COMMUNITY): Payer: Medicare Other

## 2016-06-07 ENCOUNTER — Inpatient Hospital Stay (HOSPITAL_COMMUNITY)
Admission: EM | Admit: 2016-06-07 | Discharge: 2016-06-09 | DRG: 683 | Disposition: A | Discharge status: Home / Self Care | Payer: Medicare Other | Attending: Internal Medicine | Admitting: Internal Medicine

## 2016-06-07 ENCOUNTER — Encounter (HOSPITAL_COMMUNITY): Payer: Self-pay | Admitting: *Deleted

## 2016-06-07 DIAGNOSIS — R338 Other retention of urine: Secondary | ICD-10-CM | POA: Diagnosis not present

## 2016-06-07 DIAGNOSIS — R339 Retention of urine, unspecified: Secondary | ICD-10-CM | POA: Diagnosis not present

## 2016-06-07 DIAGNOSIS — Z8249 Family history of ischemic heart disease and other diseases of the circulatory system: Secondary | ICD-10-CM

## 2016-06-07 DIAGNOSIS — N138 Other obstructive and reflux uropathy: Secondary | ICD-10-CM | POA: Diagnosis not present

## 2016-06-07 DIAGNOSIS — N401 Enlarged prostate with lower urinary tract symptoms: Secondary | ICD-10-CM | POA: Diagnosis present

## 2016-06-07 DIAGNOSIS — I10 Essential (primary) hypertension: Secondary | ICD-10-CM | POA: Diagnosis not present

## 2016-06-07 DIAGNOSIS — R739 Hyperglycemia, unspecified: Secondary | ICD-10-CM | POA: Diagnosis not present

## 2016-06-07 DIAGNOSIS — K5902 Outlet dysfunction constipation: Secondary | ICD-10-CM

## 2016-06-07 DIAGNOSIS — Z7982 Long term (current) use of aspirin: Secondary | ICD-10-CM

## 2016-06-07 DIAGNOSIS — N179 Acute kidney failure, unspecified: Secondary | ICD-10-CM | POA: Diagnosis not present

## 2016-06-07 DIAGNOSIS — Z79899 Other long term (current) drug therapy: Secondary | ICD-10-CM

## 2016-06-07 DIAGNOSIS — K5900 Constipation, unspecified: Secondary | ICD-10-CM

## 2016-06-07 LAB — COMPREHENSIVE METABOLIC PANEL
ALT: 23 U/L (ref 17–63)
AST: 23 U/L (ref 15–41)
Albumin: 3.6 g/dL (ref 3.5–5.0)
Alkaline Phosphatase: 45 U/L (ref 38–126)
Anion gap: 19 — ABNORMAL HIGH (ref 5–15)
BUN: 76 mg/dL — ABNORMAL HIGH (ref 6–20)
CHLORIDE: 93 mmol/L — AB (ref 101–111)
CO2: 20 mmol/L — AB (ref 22–32)
Calcium: 8.9 mg/dL (ref 8.9–10.3)
Creatinine, Ser: 5.27 mg/dL — ABNORMAL HIGH (ref 0.61–1.24)
GFR calc non Af Amer: 10 mL/min — ABNORMAL LOW (ref >60)
GFR, EST AFRICAN AMERICAN: 12 mL/min — AB (ref >60)
Glucose, Bld: 133 mg/dL — ABNORMAL HIGH (ref 65–99)
POTASSIUM: 4.9 mmol/L (ref 3.5–5.1)
SODIUM: 132 mmol/L — AB (ref 135–145)
Total Bilirubin: 0.3 mg/dL (ref 0.3–1.2)
Total Protein: 7 g/dL (ref 6.5–8.1)

## 2016-06-07 LAB — CBC WITH DIFFERENTIAL/PLATELET
Basophils Absolute: 0 K/uL (ref 0.0–0.1)
Basophils Relative: 0 %
Eosinophils Absolute: 0 K/uL (ref 0.0–0.7)
Eosinophils Relative: 0 %
HCT: 42.4 % (ref 39.0–52.0)
Hemoglobin: 15 g/dL (ref 13.0–17.0)
Lymphocytes Relative: 8 %
Lymphs Abs: 0.9 K/uL (ref 0.7–4.0)
MCH: 23.7 pg — ABNORMAL LOW (ref 26.0–34.0)
MCHC: 35.4 g/dL (ref 30.0–36.0)
MCV: 67.1 fL — ABNORMAL LOW (ref 78.0–100.0)
Monocytes Absolute: 2.1 K/uL — ABNORMAL HIGH (ref 0.1–1.0)
Monocytes Relative: 19 %
Neutro Abs: 8.1 K/uL — ABNORMAL HIGH (ref 1.7–7.7)
Neutrophils Relative %: 73 %
Platelets: 175 K/uL (ref 150–400)
RBC: 6.32 MIL/uL — ABNORMAL HIGH (ref 4.22–5.81)
RDW: 13.7 % (ref 11.5–15.5)
WBC: 11.1 K/uL — ABNORMAL HIGH (ref 4.0–10.5)

## 2016-06-07 LAB — URINALYSIS, ROUTINE W REFLEX MICROSCOPIC
Bilirubin Urine: NEGATIVE
Glucose, UA: 150 mg/dL — AB
Ketones, ur: NEGATIVE mg/dL
Leukocytes, UA: NEGATIVE
Nitrite: NEGATIVE
Protein, ur: 30 mg/dL — AB
Specific Gravity, Urine: 1.008 (ref 1.005–1.030)
Squamous Epithelial / HPF: NONE SEEN
pH: 6 (ref 5.0–8.0)

## 2016-06-07 MED ORDER — ACETAMINOPHEN 325 MG PO TABS: 650.0000 mg | ORAL_TABLET | Freq: Four times a day (QID) | ORAL | Status: DC | PRN

## 2016-06-07 MED ORDER — AMLODIPINE BESYLATE 5 MG PO TABS
5.0000 mg | ORAL_TABLET | Freq: Every day | ORAL | Status: DC
  Administered 2016-06-08 – 2016-06-09 (×2): 5 mg via ORAL
  Filled 2016-06-07 (×2): qty 1

## 2016-06-07 MED ORDER — HYDRALAZINE HCL 20 MG/ML IJ SOLN: 5.0000 mg | INTRAMUSCULAR | Status: DC | PRN

## 2016-06-07 MED ORDER — HEPARIN SODIUM (PORCINE) 5000 UNIT/ML IJ SOLN
5000.0000 [IU] | Freq: Three times a day (TID) | INTRAMUSCULAR | Status: DC
  Administered 2016-06-07 – 2016-06-09 (×6): 5000 [IU] via SUBCUTANEOUS
  Filled 2016-06-07 (×6): qty 1

## 2016-06-07 MED ORDER — ASPIRIN EC 81 MG PO TBEC
162.0000 mg | DELAYED_RELEASE_TABLET | Freq: Every day | ORAL | Status: DC
  Administered 2016-06-08 – 2016-06-09 (×2): 162 mg via ORAL
  Filled 2016-06-07 (×2): qty 2

## 2016-06-07 MED ORDER — SENNA 8.6 MG PO TABS
2.0000 | ORAL_TABLET | Freq: Every day | ORAL | Status: DC
  Administered 2016-06-09: 17.2 mg via ORAL
  Filled 2016-06-07 (×3): qty 2

## 2016-06-07 MED ORDER — ONDANSETRON HCL 4 MG/2ML IJ SOLN: 4.0000 mg | Freq: Four times a day (QID) | INTRAMUSCULAR | Status: DC | PRN

## 2016-06-07 MED ORDER — ACETAMINOPHEN 650 MG RE SUPP: 650.0000 mg | Freq: Four times a day (QID) | RECTAL | Status: DC | PRN

## 2016-06-07 MED ORDER — HYDROCHLOROTHIAZIDE 25 MG PO TABS
25.0000 mg | ORAL_TABLET | Freq: Every day | ORAL | Status: DC
  Administered 2016-06-08: 25 mg via ORAL
  Filled 2016-06-07: qty 1

## 2016-06-07 MED ORDER — POLYETHYLENE GLYCOL 3350 17 G PO PACK
17.0000 g | PACK | Freq: Every day | ORAL | Status: DC
  Filled 2016-06-07 (×2): qty 1

## 2016-06-07 MED ORDER — ONDANSETRON HCL 4 MG PO TABS: 4.0000 mg | ORAL_TABLET | Freq: Four times a day (QID) | ORAL | Status: DC | PRN

## 2016-06-07 MED ORDER — SODIUM CHLORIDE 0.9 % IV SOLN
INTRAVENOUS | Status: DC
  Administered 2016-06-07 – 2016-06-08 (×4): via INTRAVENOUS

## 2016-06-07 MED ORDER — CEPHALEXIN 250 MG PO CAPS: 500.0000 mg | ORAL_CAPSULE | Freq: Three times a day (TID) | ORAL | Status: DC

## 2016-06-07 NOTE — ED Notes (Signed)
Per service response the kitchen supervisor is following up on the pts lunch tray not being delivered, according to the supervisor the tray was delivered to [facility], the supervisor is f/u, pt informed, offered bag lunch

## 2016-06-07 NOTE — ED Notes (Signed)
Pt transported to xray 

## 2016-06-07 NOTE — ED Provider Notes (Signed)
[facility] DEPT Provider Note   CSN: [id number] Arrival date & time: 06/07/16  0430     History   Chief Complaint Chief Complaint  Patient presents with  . Constipation    HPI John Shields is a 71 y.o. male.  HPI Patient was evaluated 8 days ago in the emergency department for urinary retention and Foley catheter was placed. He was seen 3 days ago requesting removal of the catheter. Catheter was removed and patient was only able to urinate a small amount of bloody urine. Advised to have the catheter replaced. Patient refuses this. Patient states he is not taking antibiotics as prescribed and has not followed up with urology. Presents with worsening abdominal discomfort and distention. He's having difficulty urinating. States he does not know if the urine is just has had decreased bowel movements over the past 4-5 days. Denies any lower extremity weakness or numbness.States he's having pain over his kidneys left greater than right. Past Medical History:  Diagnosis Date  . Hypertension   . Urinary retention    pt reports this 2 years ago    There are no active problems to display for this patient.   History reviewed. No pertinent surgical history.     Home Medications    Prior to Admission medications   Medication Sig Start Date End Date Taking? Authorizing Provider  amLODipine (NORVASC) 10 MG tablet Take 5 mg by mouth daily.    Historical Provider, MD  aspirin EC 81 MG tablet Take 162 mg by mouth daily.    Historical Provider, MD  cephALEXin (KEFLEX) 500 MG capsule Take 1 capsule (500 mg total) by mouth 3 (three) times daily. 06/04/16   Julianne Rice, MD  cloNIDine (CATAPRES) 0.1 MG tablet Take 1 tablet (0.1 mg total) by mouth daily. 08/27/15   Leonard Schwartz, MD  hydrochlorothiazide (HYDRODIURIL) 25 MG tablet Take 25 mg by mouth daily.    Historical Provider, MD  Multiple Vitamin (MULTIVITAMIN WITH MINERALS) TABS tablet Take 1 tablet by mouth daily.    Historical  Provider, MD    Family History History reviewed. No pertinent family history.  Social History Social History  Substance Use Topics  . Smoking status: Never Smoker  . Smokeless tobacco: Never Used  . Alcohol use No     Allergies   Patient has no known allergies.   Review of Systems Review of Systems  Constitutional: Positive for fatigue. Negative for chills and fever.  Respiratory: Negative for cough and shortness of breath.   Cardiovascular: Negative for chest pain.  Gastrointestinal: Positive for abdominal distention, abdominal pain and constipation. Negative for diarrhea, nausea and vomiting.  Genitourinary: Positive for decreased urine volume, difficulty urinating and flank pain. Negative for dysuria.  Musculoskeletal: Positive for back pain.  Skin: Negative for rash and wound.  Neurological: Negative for dizziness, weakness, light-headedness, numbness and headaches.  All other systems reviewed and are negative.    Physical Exam Updated Vital Signs BP 146/86   Pulse 90   Resp 16   Ht 5\' 8"  (1.727 m)   Wt 185 lb (83.9 kg)   SpO2 97%   BMI 28.13 kg/m   Physical Exam  Constitutional: He is oriented to person, place, and time. He appears well-developed and well-nourished.  HENT:  Head: Normocephalic and atraumatic.  Mouth/Throat: Oropharynx is clear and moist.  Eyes: EOM are normal. Pupils are equal, round, and reactive to light.  Neck: Normal range of motion. Neck supple.  Cardiovascular: Normal rate and regular rhythm.  Pulmonary/Chest: Effort normal and breath sounds normal. No respiratory distress. He has no wheezes. He has no rales. He exhibits no tenderness.  Abdominal: Soft. Bowel sounds are normal. He exhibits distension. There is tenderness. There is no rebound and no guarding.  Firm and midline lower abdominal mass extending up past the umbilicus. Tenderness to palpation. No rebound or guarding.  Musculoskeletal: Normal range of motion. He exhibits no  edema or tenderness.  No CVA tenderness. No midline thoracic or lumbar tenderness.  Neurological: He is alert and oriented to person, place, and time.  5/5 motor in all extremities. Sensation fully intact. No saddle anesthesia.  Skin: Skin is warm and dry. Capillary refill takes less than 2 seconds. No rash noted. No erythema.  Psychiatric: He has a normal mood and affect. His behavior is normal.  Nursing note and vitals reviewed.    ED Treatments / Results  Labs (all labs ordered are listed, but only abnormal results are displayed) Labs Reviewed  CBC WITH DIFFERENTIAL/PLATELET - Abnormal; Notable for the following:       Result Value   WBC 11.1 (*)    RBC 6.32 (*)    MCV 67.1 (*)    MCH 23.7 (*)    Neutro Abs 8.1 (*)    Monocytes Absolute 2.1 (*)    All other components within normal limits  COMPREHENSIVE METABOLIC PANEL - Abnormal; Notable for the following:    Sodium 132 (*)    Chloride 93 (*)    CO2 20 (*)    Glucose, Bld 133 (*)    BUN 76 (*)    Creatinine, Ser 5.27 (*)    GFR calc non Af Amer 10 (*)    GFR calc Af Amer 12 (*)    Anion gap 19 (*)    All other components within normal limits  URINALYSIS, ROUTINE W REFLEX MICROSCOPIC - Abnormal; Notable for the following:    Glucose, UA 150 (*)    Hgb urine dipstick LARGE (*)    Protein, ur 30 (*)    Bacteria, UA RARE (*)    All other components within normal limits    EKG  EKG Interpretation None       Radiology No results found.  Procedures Procedures (including critical care time)  Medications Ordered in ED Medications - No data to display   Initial Impression / Assessment and Plan / ED Course  I have reviewed the triage vital signs and the nursing notes.  Pertinent labs & imaging results that were available during my care of the patient were reviewed by me and considered in my medical decision making (see chart for details).  Clinical Course   Bedside ultrasound with bladder extending above the  umbilicus. Foley catheter was placed with production of close to 2 L of urine. Creatinine is significantly elevated. 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   Discussed with Dr. Barbaraann Faster. Will see patient in the emergency department and admit.    Final Clinical Impressions(s) / ED Diagnoses   Final diagnoses:  AKI (acute kidney injury) [facility])  Urinary retention    New Prescriptions New Prescriptions   No medications on file     Julianne Rice, MD 06/07/16 [phone]

## 2016-06-07 NOTE — ED Notes (Signed)
Xray aware pt ready

## 2016-06-07 NOTE — ED Notes (Signed)
Foley bag is emptied

## 2016-06-07 NOTE — ED Triage Notes (Signed)
C/o not being able to have a bowel movement in 5 days also c/o decreased urine output. Decreased energy. States he as only able to pass a very small soft stool this pm

## 2016-06-07 NOTE — H&P (Signed)
History and Physical    Blanche Luczak [id number] DOB: 1946-01-21 DOA: 06/07/2016  PCP: [facility] Patient coming from: Home  Chief Complaint: constipation and innability to pee   HPI: John Shields is a 71 y.o. male with medical history significant of HTN, urinary retention, BPH, complaining of progressive loss of ability to urinate. Only able to dribble urine. Patient seen in the ED on 05/30/2016 and had a Foley catheter placed. Patient returned on January 7 requesting it to be removed. After removal patient was able to urinate a small amount and the Foley catheter was left out at that time. Patient also complaining of approximately 5 day history of little to no bowel movement. States that his stools have only been liquid. Patient has taken several doses of laxatives to help with this. This is never been a problem for him. Denies any significant abdominal pain, fevers, pain, shortness of breath, palpitations, nausea, vomiting, melena, hematochezia, hematemesis, neck stiffness, headache there have been several episodes of hematuria ever since patient was catheterized. Patient reports seeing urology one time previously a couple of years ago after a similar problem but decided not to follow-up after initial visit. Patient gets all of his care through the [facility]    ED Course: Foley catheter placed w/ 1.5L output.   Review of Systems: As per HPI otherwise 10 point review of systems negative.   Ambulatory Status:no restrictions  Past Medical History:  Diagnosis Date  . BPH (benign prostatic hyperplasia)   . Hypertension   . Urinary retention    pt reports this 2 years ago    Past Surgical History:  Procedure Laterality Date  . NO PAST SURGERIES      Social History   Social History  . Marital status: Significant Other    Spouse name: N/A  . Number of children: N/A  . Years of education: N/A   Occupational History  . Not on file.   Social History Main Topics  .  Smoking status: Never Smoker  . Smokeless tobacco: Never Used  . Alcohol use No  . Drug use: No  . Sexual activity: Not on file   Other Topics Concern  . Not on file   Social History Narrative  . No narrative on file    No Known Allergies  Family History  Problem Relation Age of Onset  . Heart attack Father   . Heart attack Brother     Prior to Admission medications   Medication Sig Start Date End Date Taking? Authorizing Provider  amLODipine (NORVASC) 10 MG tablet Take 5 mg by mouth daily.    Historical Provider, MD  aspirin EC 81 MG tablet Take 162 mg by mouth daily.    Historical Provider, MD  cephALEXin (KEFLEX) 500 MG capsule Take 1 capsule (500 mg total) by mouth 3 (three) times daily. 06/04/16   Julianne Rice, MD  cloNIDine (CATAPRES) 0.1 MG tablet Take 1 tablet (0.1 mg total) by mouth daily. 08/27/15   Leonard Schwartz, MD  hydrochlorothiazide (HYDRODIURIL) 25 MG tablet Take 25 mg by mouth daily.    Historical Provider, MD  Multiple Vitamin (MULTIVITAMIN WITH MINERALS) TABS tablet Take 1 tablet by mouth daily.    Historical Provider, MD    Physical Exam: Vitals:   06/07/16 0645 06/07/16 0700 06/07/16 0815 06/07/16 0845  BP: 156/93 146/85 152/96 146/86  Pulse: 88 89 95 90  Resp:   16 16  TempSrc:      SpO2: 95% 96% 97% 97%  Weight:  Height:         General:  Appears calm and comfortable Eyes:  PERRL, EOMI, normal lids, iris ENT:  grossly normal hearing, lips & tongue, mmm Neck:  no LAD, masses or thyromegaly Cardiovascular:  RRR, no m/r/g.   Respiratory:  CTA bilaterally, no w/r/r. Normal respiratory effort. Abdomen:  soft, ntnd, NABS Skin:  no rash or induration seen on limited exam Musculoskeletal:  grossly normal tone BUE/BLE, good ROM, no bony abnormality Psychiatric:  grossly normal mood and affect, speech fluent and appropriate, AOx3 Neurologic:  CN 2-12 grossly intact, moves all extremities in coordinated fashion, sensation intact  Labs on  Admission: I have personally reviewed following labs and imaging studies  CBC:  Recent Labs Lab 06/07/16 0735  WBC 11.1*  NEUTROABS 8.1*  HGB 15.0  HCT 42.4  MCV 67.1*  PLT 0000000   Basic Metabolic Panel:  Recent Labs Lab 06/07/16 0735  NA 132*  K 4.9  CL 93*  CO2 20*  GLUCOSE 133*  BUN 76*  CREATININE 5.27*  CALCIUM 8.9   GFR: Estimated Creatinine Clearance: 13.8 mL/min (by C-G formula based on SCr of 5.27 mg/dL (H)). Liver Function Tests:  Recent Labs Lab 06/07/16 0735  AST 23  ALT 23  ALKPHOS 45  BILITOT 0.3  PROT 7.0  ALBUMIN 3.6   No results for input(s): LIPASE, AMYLASE in the last 168 hours. No results for input(s): AMMONIA in the last 168 hours. Coagulation Profile: No results for input(s): INR, PROTIME in the last 168 hours. Cardiac Enzymes: No results for input(s): CKTOTAL, CKMB, CKMBINDEX, TROPONINI in the last 168 hours. BNP (last 3 results) No results for input(s): PROBNP in the last 8760 hours. HbA1C: No results for input(s): HGBA1C in the last 72 hours. CBG: No results for input(s): GLUCAP in the last 168 hours. Lipid Profile: No results for input(s): CHOL, HDL, LDLCALC, TRIG, CHOLHDL, LDLDIRECT in the last 72 hours. Thyroid Function Tests: No results for input(s): TSH, T4TOTAL, FREET4, T3FREE, THYROIDAB in the last 72 hours. Anemia Panel: No results for input(s): VITAMINB12, FOLATE, FERRITIN, TIBC, IRON, RETICCTPCT in the last 72 hours. Urine analysis:    Component Value Date/Time   COLORURINE YELLOW 06/07/2016 0815   APPEARANCEUR CLEAR 06/07/2016 0815   LABSPEC 1.008 06/07/2016 0815   PHURINE 6.0 06/07/2016 0815   GLUCOSEU 150 (A) 06/07/2016 0815   HGBUR LARGE (A) 06/07/2016 0815   BILIRUBINUR NEGATIVE 06/07/2016 0815   KETONESUR NEGATIVE 06/07/2016 0815   PROTEINUR 30 (A) 06/07/2016 0815   UROBILINOGEN 0.2 11/30/2014 1942   NITRITE NEGATIVE 06/07/2016 0815   LEUKOCYTESUR NEGATIVE 06/07/2016 0815    Creatinine  Clearance: Estimated Creatinine Clearance: 13.8 mL/min (by C-G formula based on SCr of 5.27 mg/dL (H)).  Sepsis Labs: @LABRCNTIP (procalcitonin:4,lacticidven:4) ) Recent Results (from the past 240 hour(s))  Urine culture     Status: Abnormal   Collection Time: 06/04/16  8:41 PM  Result Value Ref Range Status   Specimen Description URINE, RANDOM  Final   Special Requests NONE  Final   Culture MULTIPLE SPECIES PRESENT, SUGGEST RECOLLECTION (A)  Final   Report Status 06/06/2016 FINAL  Final     Radiological Exams on Admission: No results found.  Assessment/Plan Active Problems:   Urinary retention   Constipation   Acute renal failure (ARF) (HCC)   Essential hypertension   Hyperglycemia   Acute renal failure: Creatinine 5.27. Baseline 0.98. BUN 76. Likely secondary to obstructive uropathy from BPH. Pt w/ 1.5L in bladder after foley placed.  urinary retention. Anticipate complete and rapid resolution of renal dysfunction now the patient has a Foley placed. - Continue Foley - Aggressive IVF - If renal function does not improve consider nephrology consult and renal ultrasound  Urinary retention: Intermittent problem for the patient over several years. Likely secondary to BPH. Discussed case with urologist, Dr. Alinda Money, who is graciously agreed to have the patient scheduled for an outpatient follow-up. If patient has now been contacted by their office prior to discharge please provide him with contact information for [facility] to find out when his appointment is. Due to the patient having multiple traumatic catheter attempts I feel it is prudent to start him on prophylactic antibiotics until the catheter is removed - Keflex 500 mg 3 times a day - Follow-up with [facility] as above - Will need outpt PSA  Constipation: likely from large obstructing bladder.  - KUB - Miralax, Sennokot.  Hyperglycemia: 133. No h/o DM - A1c  HTN: Pt self medicates w/ norvasc, HCTZ and  clonidine. Needs a regular regimen - Continue Norvasc - Hold clonidine (pt was taking Qday which is not a good regimen considering rebound) - Hold HCTZ due to ARF (previously pt would only take it whenever he didn't take a norvasc). Consider restarting prior to discharge.  - Hydralazine IV Prn  Heart Health: pt w/ strong fmily history of heart disease. No previous cardiac workup but on ASA - continue ASA - EKG   DVT prophylaxis: Hep  Code Status: FULL  Family Communication: none  Disposition Plan: pending improvement in renal function  Consults called: Urology  Admission status: obs    Emelyn Roen J MD [facility]  If 7PM-7AM, please contact night-coverage [email or web address] Password [email or web address]  06/07/2016, 10:30 AM

## 2016-06-07 NOTE — ED Notes (Signed)
Per prior shift RN patient had a large bowel movement

## 2016-06-08 DIAGNOSIS — R739 Hyperglycemia, unspecified: Secondary | ICD-10-CM | POA: Diagnosis present

## 2016-06-08 DIAGNOSIS — N138 Other obstructive and reflux uropathy: Secondary | ICD-10-CM | POA: Diagnosis present

## 2016-06-08 DIAGNOSIS — Z79899 Other long term (current) drug therapy: Secondary | ICD-10-CM | POA: Diagnosis not present

## 2016-06-08 DIAGNOSIS — N401 Enlarged prostate with lower urinary tract symptoms: Secondary | ICD-10-CM | POA: Diagnosis present

## 2016-06-08 DIAGNOSIS — Z8249 Family history of ischemic heart disease and other diseases of the circulatory system: Secondary | ICD-10-CM | POA: Diagnosis not present

## 2016-06-08 DIAGNOSIS — Z7982 Long term (current) use of aspirin: Secondary | ICD-10-CM | POA: Diagnosis not present

## 2016-06-08 DIAGNOSIS — R339 Retention of urine, unspecified: Secondary | ICD-10-CM | POA: Diagnosis not present

## 2016-06-08 DIAGNOSIS — I10 Essential (primary) hypertension: Secondary | ICD-10-CM

## 2016-06-08 DIAGNOSIS — R338 Other retention of urine: Secondary | ICD-10-CM | POA: Diagnosis present

## 2016-06-08 DIAGNOSIS — N179 Acute kidney failure, unspecified: Principal | ICD-10-CM

## 2016-06-08 DIAGNOSIS — K5900 Constipation, unspecified: Secondary | ICD-10-CM | POA: Diagnosis not present

## 2016-06-08 LAB — BASIC METABOLIC PANEL
Anion gap: 10 (ref 5–15)
Anion gap: 11 (ref 5–15)
BUN: 41 mg/dL — ABNORMAL HIGH (ref 6–20)
BUN: 33 mg/dL — ABNORMAL HIGH (ref 6–20)
CHLORIDE: 103 mmol/L (ref 101–111)
CHLORIDE: 101 mmol/L (ref 101–111)
CO2: 25 mmol/L (ref 22–32)
CO2: 26 mmol/L (ref 22–32)
Calcium: 8.3 mg/dL — ABNORMAL LOW (ref 8.9–10.3)
Calcium: 8 mg/dL — ABNORMAL LOW (ref 8.9–10.3)
Creatinine, Ser: 1.75 mg/dL — ABNORMAL HIGH (ref 0.61–1.24)
Creatinine, Ser: 1.62 mg/dL — ABNORMAL HIGH (ref 0.61–1.24)
GFR calc non Af Amer: 38 mL/min — ABNORMAL LOW (ref >60)
GFR calc non Af Amer: 41 mL/min — ABNORMAL LOW (ref >60)
GFR, EST AFRICAN AMERICAN: 44 mL/min — AB (ref >60)
GFR, EST AFRICAN AMERICAN: 48 mL/min — AB (ref >60)
Glucose, Bld: 96 mg/dL (ref 65–99)
Glucose, Bld: 136 mg/dL — ABNORMAL HIGH (ref 65–99)
POTASSIUM: 4 mmol/L (ref 3.5–5.1)
POTASSIUM: 4.2 mmol/L (ref 3.5–5.1)
SODIUM: 138 mmol/L (ref 135–145)
Sodium: 138 mmol/L (ref 135–145)

## 2016-06-08 LAB — CBC
HEMATOCRIT: 40.8 % (ref 39.0–52.0)
HEMOGLOBIN: 14 g/dL (ref 13.0–17.0)
MCH: 23.2 pg — AB (ref 26.0–34.0)
MCHC: 34.3 g/dL (ref 30.0–36.0)
MCV: 67.7 fL — ABNORMAL LOW (ref 78.0–100.0)
Platelets: 178 10*3/uL (ref 150–400)
RBC: 6.03 MIL/uL — AB (ref 4.22–5.81)
RDW: 13.8 % (ref 11.5–15.5)
WBC: 7.8 10*3/uL (ref 4.0–10.5)

## 2016-06-08 LAB — GLUCOSE, CAPILLARY: GLUCOSE-CAPILLARY: 170 mg/dL — AB (ref 65–99)

## 2016-06-08 MED ORDER — CEPHALEXIN 500 MG PO CAPS
500.0000 mg | ORAL_CAPSULE | Freq: Three times a day (TID) | ORAL | Status: DC
  Administered 2016-06-08 – 2016-06-09 (×4): 500 mg via ORAL
  Filled 2016-06-08 (×4): qty 1

## 2016-06-08 NOTE — Care Management Obs Status (Signed)
[facility] NOTIFICATION   Patient Details  Name: John Shields MRN: [id number] Date of Birth: 1946-03-07   Medicare Observation Status Notification Given:  Yes    Sharin Mons, RN 06/08/2016, 9:18 AM

## 2016-06-08 NOTE — Care Management Note (Addendum)
Case Management Note  Patient Details  Name: John Shields MRN: [id number] Date of Birth: June 26, 1945  Subjective/Objective:         Pt with medical history significant of HTN, urinary retention, BPH. Hx of ED visit 05/30/2016 , foley placed/ d/c on 06/04/2016. Presents with c/o progressive loss of the ability to urinate. States  only able to dribble urine. Pt also with c/o constipation. From home alone. Prior to hospital visit pt states independent with  ADL's and uses no DME.     Kamalei Vandervoort (Spouse)     [phone]        [facility]  Action/Plan: Plan is to d/c on tomorrow with foley catheter with a urology f/u. CM to f/u with disposition needs.  Expected Discharge Date:     06/08/2016             Expected Discharge Plan:  Home/Self Care  In-House Referral:     Discharge planning Services  CM Consult  Post Acute Care Choice:    Choice offered to:     DME Arranged:    DME Agency:     HH Arranged:    HH Agency:     Status of Service:  In process, will continue to follow  If discussed at Long Length of Stay Meetings, dates discussed:    Additional Comments:  Sharin Mons, RN 06/08/2016, 9:23 AM

## 2016-06-08 NOTE — Progress Notes (Signed)
PROGRESS NOTE  John Shields  [id number] DOB: 08-Nov-1945  DOA: 06/07/2016 PCP: [facility]   Brief Narrative:  71 year old male with PMH of HTN, urinary retention, BPH, recently seen in the ED 05/30/16 and had Foley catheter placed and was removed at his request on 06/04/16 following which he was unable to urinate well and now presented to [facility] ED on 06/07/16 with 5 day history of constipation and difficulty urinating. Admitted for urinary retention and acute kidney injury related to obstruction. Foley catheter drained 1.5 L in the ED.   Assessment & Plan:   Active Problems:   Urinary retention   Constipation   Acute renal failure (ARF) (HCC)   Essential hypertension   Hyperglycemia   1. Acute kidney injury due to urinary retention from BPH: Foley catheter placed in ED and drained 1.5 L. Creatinine has improved to 1.7. Concern for post obstructive polyuria and electrolyte imbalances. -6.7 L since admission. Hence monitor BMP closely. Expect creatinine to normalize. Presented with creatinine of 5.27. Baseline creatinine 0.98. 2. Acute urinary retention: Intermittent problem for the past several years. Likely secondary to BPH. Admitting M.D. discussed with urology/Dr. Alinda Money who recommended discharging him with Foley catheter until outpatient follow-up with urology, Keflex 500 MG 3 times daily due to multiple traumatic catheter attempts and outpatient PSA. 3. Constipation: Resolved after bowel regimen. Continue bowel regimen. 4. Hyperglycemia without DM: Follow A1c. 5. Essential hypertension: Controlled. Continue amlodipine. Hold HCTZ for now. Clonidine discontinued.   DVT prophylaxis: Heparin Code Status: Full Family Communication: None at bedside Disposition Plan: DC home possibly 06/09/16.   Consultants:   Discussed with urology  Procedures:   Foley catheter  Antimicrobials:   Keflex    Subjective: Feels much better. Denies complaints. No abdominal pain. Patient  had a large BM last night.  Objective:  Vitals:   06/07/16 1823 06/07/16 2020 06/08/16 0500 06/08/16 1357  BP: 158/97 125/79 135/69 128/79  Pulse:  85 81 82  Resp:  18 18 16   Temp:  97.7 F (36.5 C) 98.1 F (36.7 C) 97.8 F (36.6 C)  TempSrc:  Oral Oral Oral  SpO2: 100% 96% 97% 96%  Weight:  95 kg (209 lb 6.4 oz)    Height:  5\' 8"  (1.727 m)      Intake/Output Summary (Last 24 hours) at 06/08/16 1658 Last data filed at 06/08/16 1404  Gross per 24 hour  Intake             1560 ml  Output             5000 ml  Net            -3440 ml   Filed Weights   06/07/16 0439 06/07/16 2020  Weight: 83.9 kg (185 lb) 95 kg (209 lb 6.4 oz)    Examination:  General exam: Pleasant elderly male lying comfortably in bed. Respiratory system: Clear to auscultation. Respiratory effort normal. Cardiovascular system: S1 & S2 heard, RRR. No JVD, murmurs, rubs, gallops or clicks. No pedal edema. Gastrointestinal system: Abdomen is nondistended, soft and nontender. No organomegaly or masses felt. Normal bowel sounds heard. Foley catheter +. Central nervous system: Alert and oriented. No focal neurological deficits. Extremities: Symmetric 5 x 5 power. Skin: No rashes, lesions or ulcers Psychiatry: Judgement and insight appear normal. Mood & affect appropriate.     Data Reviewed: I have personally reviewed following labs and imaging studies  CBC:  Recent Labs Lab 06/07/16 0735 06/08/16 0635  WBC 11.1* 7.8  NEUTROABS 8.1*  --   HGB 15.0 14.0  HCT 42.4 40.8  MCV 67.1* 67.7*  PLT 175 0000000   Basic Metabolic Panel:  Recent Labs Lab 06/07/16 0735 06/08/16 0635  NA 132* 138  K 4.9 4.0  CL 93* 103  CO2 20* 25  GLUCOSE 133* 96  BUN 76* 41*  CREATININE 5.27* 1.75*  CALCIUM 8.9 8.3*   GFR: Estimated Creatinine Clearance: 43.9 mL/min (by C-G formula based on SCr of 1.75 mg/dL (H)). Liver Function Tests:  Recent Labs Lab 06/07/16 0735  AST 23  ALT 23  ALKPHOS 45  BILITOT 0.3    PROT 7.0  ALBUMIN 3.6   No results for input(s): LIPASE, AMYLASE in the last 168 hours. No results for input(s): AMMONIA in the last 168 hours. Coagulation Profile: No results for input(s): INR, PROTIME in the last 168 hours. Cardiac Enzymes: No results for input(s): CKTOTAL, CKMB, CKMBINDEX, TROPONINI in the last 168 hours. BNP (last 3 results) No results for input(s): PROBNP in the last 8760 hours. HbA1C: No results for input(s): HGBA1C in the last 72 hours. CBG: No results for input(s): GLUCAP in the last 168 hours. Lipid Profile: No results for input(s): CHOL, HDL, LDLCALC, TRIG, CHOLHDL, LDLDIRECT in the last 72 hours. Thyroid Function Tests: No results for input(s): TSH, T4TOTAL, FREET4, T3FREE, THYROIDAB in the last 72 hours. Anemia Panel: No results for input(s): VITAMINB12, FOLATE, FERRITIN, TIBC, IRON, RETICCTPCT in the last 72 hours.  Sepsis Labs:  Recent Labs Lab 06/07/16 0735 06/08/16 0635  WBC 11.1* 7.8    Recent Results (from the past 240 hour(s))  Urine culture     Status: Abnormal   Collection Time: 06/04/16  8:41 PM  Result Value Ref Range Status   Specimen Description URINE, RANDOM  Final   Special Requests NONE  Final   Culture MULTIPLE SPECIES PRESENT, SUGGEST RECOLLECTION (A)  Final   Report Status 06/06/2016 FINAL  Final         Radiology Studies: Dg Abd 1 View  Result Date: 06/07/2016 CLINICAL DATA:  Constipation for 5 days EXAM: ABDOMEN - 1 VIEW FINDINGS: No dilated loops large or small bowel. Normal stool burden. No pathologic calcifications. No organomegaly. Degenerative osteophytosis of the spine. IMPRESSION: No bowel obstruction.  Minimal stool burden. Electronically Signed   By: Suzy Bouchard M.D.   On: 06/07/2016 12:18        Scheduled Meds: . amLODipine  5 mg Oral Daily  . aspirin EC  162 mg Oral Daily  . heparin  5,000 Units Subcutaneous Q8H  . hydrochlorothiazide  25 mg Oral Daily  . polyethylene glycol  17 g Oral  Daily  . senna  2 tablet Oral Daily   Continuous Infusions: . sodium chloride 150 mL/hr at 06/08/16 1418     LOS: 0 days       [facility], MD [facility] Pager [phone]  If 7PM-7AM, please contact night-coverage [email or web address] Password [facility] 06/08/2016, 4:58 PM

## 2016-06-09 DIAGNOSIS — N179 Acute kidney failure, unspecified: Secondary | ICD-10-CM

## 2016-06-09 LAB — BASIC METABOLIC PANEL
ANION GAP: 8 (ref 5–15)
BUN: 22 mg/dL — ABNORMAL HIGH (ref 6–20)
CHLORIDE: 104 mmol/L (ref 101–111)
CO2: 28 mmol/L (ref 22–32)
Calcium: 8.3 mg/dL — ABNORMAL LOW (ref 8.9–10.3)
Creatinine, Ser: 1.28 mg/dL — ABNORMAL HIGH (ref 0.61–1.24)
GFR calc Af Amer: >60 mL/min (ref >60)
GFR, EST NON AFRICAN AMERICAN: 55 mL/min — AB (ref >60)
GLUCOSE: 105 mg/dL — AB (ref 65–99)
POTASSIUM: 3.9 mmol/L (ref 3.5–5.1)
SODIUM: 140 mmol/L (ref 135–145)

## 2016-06-09 MED ORDER — SENNA 8.6 MG PO TABS: 2.0000 | ORAL_TABLET | Freq: Every day | ORAL | 0 refills | Status: DC | PRN

## 2016-06-09 MED ORDER — CEPHALEXIN 500 MG PO CAPS: 500.0000 mg | ORAL_CAPSULE | Freq: Three times a day (TID) | ORAL | 0 refills | Status: DC

## 2016-06-09 MED ORDER — POLYETHYLENE GLYCOL 3350 17 G PO PACK: 17.0000 g | PACK | Freq: Every day | ORAL | 0 refills | Status: DC

## 2016-06-09 NOTE — Progress Notes (Signed)
Pt given discharge instructions, prescriptions, and care notes. Pt verbalized understanding AEB no further questions or concerns at this time. IV was discontinued, no redness, pain, or swelling noted at this time. Foley left in place, patent, intact and clean per MD order. Pt left the floor via wheelchair with staff in stable condition.

## 2016-06-09 NOTE — Discharge Instructions (Signed)
Acute Urinary Retention, Male Acute urinary retention is the temporary inability to urinate. This is a common problem in older men. As men age their prostates become larger and block the flow of urine from the bladder. This is usually a problem that has come on gradually. Follow these instructions at home: If you are sent home with a Foley catheter and a drainage system, you will need to discuss the best course of action with your health care provider. While the catheter is in, maintain a good intake of fluids. Keep the drainage bag emptied and lower than your catheter. This is so that contaminated urine will not flow back into your bladder, which could lead to a urinary tract infection. There are two main types of drainage bags. One is a large bag that usually is used at night. It has a good capacity that will allow you to sleep through the night without having to empty it. The second type is called a leg bag. It has a smaller capacity, so it needs to be emptied more frequently. However, the main advantage is that it can be attached by a leg strap and can go underneath your clothing, allowing you the freedom to move about or leave your home. Only take over-the-counter or prescription medicines for pain, discomfort, or fever as directed by your health care provider. Contact a health care provider if:  You develop a low-grade fever.  You experience spasms or leakage of urine with the spasms. Get help right away if:  You develop chills or fever.  Your catheter stops draining urine.  Your catheter falls out.  You start to develop increased bleeding that does not respond to rest and increased fluid intake. This information is not intended to replace advice given to you by your health care provider. Make sure you discuss any questions you have with your health care provider. Document Released: 08/21/2000 Document Revised: 10/27/2015 Document Reviewed: 10/24/2012 Elsevier Interactive Patient  Education  2017 Elsevier Inc.    Acute Kidney Injury, Adult Acute kidney injury (AKI) occurs when there is sudden (acute) damage to the kidneys.A small amount of kidney damage may not cause problems, but a large amount of damage may make it difficult or impossible for the kidneys to work the way they should. AKI may develop into long-lasting (chronic) kidney disease. Early detection and treatment of AKI may prevent kidney damage from becoming permanent or getting worse. What are the causes? Common causes of this condition include:  A problem with blood flow to the kidneys. This may be caused by:  Blood loss.  Heart and blood vessel (cardiovascular) disease.  Severe burns.  Liver disease.  Direct damage to the kidneys. This may be caused by:  Some medicines.  A kidney infection.  Poisoning.  Being around or in contact with poisonous (toxic) substances.  A surgical wound.  A hard, direct force to the kidney area.  A sudden blockage of urine flow. This may be caused by:  Cancer.  Kidney stones.  Enlarged prostate in males. What are the signs or symptoms? Symptoms develop slowly and may not be obvious until the kidney damage becomes severe. It is possible to have AKI for years without showing any symptoms. Symptoms of this condition can include:  Swelling (edema) of the face, legs, ankles, or feet.  Numbness, tingling, or loss of feeling (sensation) in the hands or feet.  Tiredness (lethargy).  Nausea or vomiting.  Confusion or trouble concentrating.  Problems with urination, such as:  Painful  or burning feeling during urination.  Decreased urine production.  Frequent urination, especially at night.  Bloody urine.  Muscle twitches and cramps, especially in the legs.  Shortness of breath.  Weakness.  Constant itchiness.  Loss of appetite.  Metallic taste in the mouth.  Trouble sleeping.  Pale lining of the eyelids and surface of the eye  (conjunctiva). How is this diagnosed? This condition may be diagnosed with various tests. Tests may include:  Blood tests.  Urine tests.  Imaging tests.  A test in which a sample of tissue is removed from the kidneys to be looked at under a microscope (kidney biopsy). How is this treated? Treatment of AKIvaries depending on the cause and severity of the kidney damage. In mild cases, treatment may not be needed. The kidneys may heal on their own. If AKI is more severe, your health care provider will treat the cause of the kidney damage, help the kidneys heal, and prevent problems from occurring. Severe cases may require a procedure to remove toxic wastes from the body (dialysis) or surgery to repair kidney damage. Surgery may involve:  Repair of a torn kidney.  Removal of a urine flow obstruction. Follow these instructions at home:  Follow your prescribed diet.  Take over-the-counter and prescription medicines only as told by your health care provider.  Do not take any new medicines unless approved by your health care provider. Many medicines can worsen your kidney damage.  Do not take any vitamin and mineral supplements unless approved by your health care provider. Many nutritional supplements can worsen your kidney damage.  The dose of some medicines that you take may need to be adjusted.  Do not use any tobacco products, such as cigarettes, chewing tobacco, and e-cigarettes. If you need help quitting, ask your health care provider.  Keep all follow-up visits as told by your health care provider. This is important.  Keep track of your blood pressure. Report changes in your blood pressure as told by your health care provider.  Achieve and maintain a healthy weight. If you need help with this, ask your health care provider.  Start or continue an exercise plan. Try to exercise at least 30 minutes a day, 5 days a week.  Stay current with immunizations as told by your health care  provider. Where to find more information:  American Association of Kidney Patients: BombTimer.gl  National Kidney Foundation: www.kidney.[place]: https://mathis.com/  Life Options Rehabilitation Program: www.lifeoptions.org and www.kidneyschool.org Contact a health care provider if:  Your symptoms get worse.  You develop new symptoms. Get help right away if:  You develop symptoms of end-stage kidney disease, which include:  Headaches.  Abnormally dark or light skin.  Numbness in the hands or feet.  Easy bruising.  Frequent hiccups.  Chest pain.  Shortness of breath.  End of menstruation in women.  You have a fever.  You have decreased urine production.  You have pain or bleeding when you urinate. This information is not intended to replace advice given to you by your health care provider. Make sure you discuss any questions you have with your health care provider. Document Released: 11/28/2010 Document Revised: 12/23/2015 Document Reviewed: 01/12/2012 Elsevier Interactive Patient Education  2017 [employer].

## 2016-06-09 NOTE — Discharge Summary (Signed)
Physician Discharge Summary  Cru Bordas [id number] DOB: 02/12/1946  PCP: [place] date: 06/07/2016 Discharge date: 06/09/2016  Recommendations for Outpatient Follow-up:  1. PCP at the [facility] in 1 week with repeat labs (CBC & BMP). 2. Dr. Raynelle Bring, Urology in 1 week: Follow-up regarding management of urinary retention and Foley catheter. Outpatient PSA can be performed during that visit.  Home Health: None Equipment/Devices: Patient was discharged with indwelling Foley catheter until outpatient follow-up with urology.  Discharge Condition: Improved and stable  CODE STATUS: Full  Diet recommendation: Heart healthy diet.  Discharge Diagnoses:  Active Problems:   Urinary retention   Constipation   Acute renal failure (ARF) (HCC)   Essential hypertension   Hyperglycemia   AKI (acute kidney injury) ([place])   Brief/Interim Summary: 71 year old male with PMH of HTN, urinary retention, BPH, recently seen in the ED 05/30/16 and had Foley catheter placed and was removed at his request on 06/04/16 following which he was unable to urinate well and now presented to [facility] ED on 06/07/16 with 5 day history of constipation and difficulty urinating. Admitted for urinary retention and acute kidney injury related to obstruction. Foley catheter drained 1.5 L in the ED.   Assessment & Plan:  1. Acute kidney injury due to urinary retention from BPH: Foley catheter placed in ED and drained 1.5 L. There was concern for post obstructive polyuria and electrolyte imbalances and hence patient was monitored in the hospital for additional day. Fortunately he did not develop any electrolyte abnormalities. Presented with creatinine of 5.27. Baseline creatinine 0.98. Creatinine improved to 1.28 on day of discharge. Patient will be discharged with indwelling Foley catheter until outpatient follow-up with urology. Acute kidney injury significantly improved and almost resolved. 2. Acute  urinary retention: Intermittent problem for the past several years. Likely secondary to BPH. Admitting M.D. discussed with urology/Dr. Alinda Money who recommended discharging him with Foley catheter until outpatient follow-up with urology, Keflex 500 MG 3 times daily due to multiple traumatic catheter attempts and outpatient PSA. 3. Constipation: Resolved after bowel regimen. Continue bowel regimen. 4. Hyperglycemia without DM:  can check A1c as outpatient. 5. Essential hypertension: Controlled. Continue amlodipine. Discontinue HCTZ at DC-was not taking it consistently. Patient was not on clonidine PTA.   Consultants:   Discussed with urology  Procedures:   Foley catheter   Discharge Instructions  Discharge Instructions    Activity as tolerated - No restrictions    Complete by:  As directed    Call MD for:    Complete by:  As directed    Difficulty urinating.   Call MD for:  extreme fatigue    Complete by:  As directed    Call MD for:  persistant dizziness or light-headedness    Complete by:  As directed    Call MD for:  persistant nausea and vomiting    Complete by:  As directed    Call MD for:  severe uncontrolled pain    Complete by:  As directed    Call MD for:  temperature >100.4    Complete by:  As directed    Diet - low sodium heart healthy    Complete by:  As directed        Medication List    STOP taking these medications   cloNIDine 0.1 MG tablet Commonly known as:  CATAPRES   hydrochlorothiazide 25 MG tablet Commonly known as:  HYDRODIURIL     TAKE these medications   amLODipine  10 MG tablet Commonly known as:  NORVASC Take 5 mg by mouth daily.   aspirin EC 81 MG tablet Take 162 mg by mouth daily.   cephALEXin 500 MG capsule Commonly known as:  KEFLEX Take 1 capsule (500 mg total) by mouth 3 (three) times daily.   multivitamin with minerals Tabs tablet Take 1 tablet by mouth daily.   polyethylene glycol packet Commonly known as:  MIRALAX /  GLYCOLAX Take 17 g by mouth daily. Start taking on:  06/10/2016   senna 8.6 MG Tabs tablet Commonly known as:  SENOKOT Take 2 tablets (17.2 mg total) by mouth daily as needed for moderate constipation.      Follow-up Information    [facility]. Schedule an appointment as soon as possible for a visit in 1 week(s).   Why:  To be seen with repeat labs (CBC & BMP). Contact information: [place] [address] [phone]        Dutch Gray, MD Follow up in 1 week(s).   Specialty:  Urology Why:  Follow up regarding management of the urinary catheter. Contact information: [place] [address] [phone]          No Known Allergies    Procedures/Studies: Dg Abd 1 View  Result Date: 06/07/2016 CLINICAL DATA:  Constipation for 5 days EXAM: ABDOMEN - 1 VIEW FINDINGS: No dilated loops large or small bowel. Normal stool burden. No pathologic calcifications. No organomegaly. Degenerative osteophytosis of the spine. IMPRESSION: No bowel obstruction.  Minimal stool burden. Electronically Signed   By: Suzy Bouchard M.D.   On: 06/07/2016 12:18      Subjective: Patient denied complaints. No abdominal pain, chest pain or dyspnea. Draining clear straw-colored urine in the catheter. Having regular BMs. Ambulating steadily in the room.  Discharge Exam:  Vitals:   06/08/16 0500 06/08/16 1357 06/08/16 2122 06/09/16 0516  BP: 135/69 128/79 134/82 125/81  Pulse: 81 82 87 80  Resp: 18 16 20 20   Temp: 98.1 F (36.7 C) 97.8 F (36.6 C) 97.4 F (36.3 C) 99 F (37.2 C)  TempSrc: Oral Oral Oral Oral  SpO2: 97% 96% 95% 97%  Weight:      Height:        General exam: Pleasant elderly male lying comfortably in bed. Respiratory system: Clear to auscultation. Respiratory effort normal. Cardiovascular system: S1 & S2 heard, RRR. No JVD, murmurs, rubs, gallops or clicks. No pedal edema. Gastrointestinal system: Abdomen is  nondistended, soft and nontender. No organomegaly or masses felt. Normal bowel sounds heard. Foley catheter +. Central nervous system: Alert and oriented. No focal neurological deficits. Extremities: Symmetric 5 x 5 power. Skin: No rashes, lesions or ulcers Psychiatry: Judgement and insight appear normal. Mood & affect appropriate.     The results of significant diagnostics from this hospitalization (including imaging, microbiology, ancillary and laboratory) are listed below for reference.     Microbiology: Recent Results (from the past 240 hour(s))  Urine culture     Status: Abnormal   Collection Time: 06/04/16  8:41 PM  Result Value Ref Range Status   Specimen Description URINE, RANDOM  Final   Special Requests NONE  Final   Culture MULTIPLE SPECIES PRESENT, SUGGEST RECOLLECTION (A)  Final   Report Status 06/06/2016 FINAL  Final     Labs: BNP (last 3 results) No results for input(s): BNP in the last 8760 hours. Basic Metabolic Panel:  Recent Labs Lab 06/07/16 0735 06/08/16 0635 06/08/16 1841  06/09/16 0515  NA 132* 138 138 140  K 4.9 4.0 4.2 3.9  CL 93* 103 101 104  CO2 20* 25 26 28   GLUCOSE 133* 96 136* 105*  BUN 76* 41* 33* 22*  CREATININE 5.27* 1.75* 1.62* 1.28*  CALCIUM 8.9 8.3* 8.0* 8.3*   Liver Function Tests:  Recent Labs Lab 06/07/16 0735  AST 23  ALT 23  ALKPHOS 45  BILITOT 0.3  PROT 7.0  ALBUMIN 3.6   CBC:  Recent Labs Lab 06/07/16 0735 06/08/16 0635  WBC 11.1* 7.8  NEUTROABS 8.1*  --   HGB 15.0 14.0  HCT 42.4 40.8  MCV 67.1* 67.7*  PLT 175 178   CBG:  Recent Labs Lab 06/08/16 2120  GLUCAP 170*   Urinalysis    Component Value Date/Time   COLORURINE YELLOW 06/07/2016 0815   APPEARANCEUR CLEAR 06/07/2016 0815   LABSPEC 1.008 06/07/2016 0815   PHURINE 6.0 06/07/2016 0815   GLUCOSEU 150 (A) 06/07/2016 0815   HGBUR LARGE (A) 06/07/2016 0815   BILIRUBINUR NEGATIVE 06/07/2016 0815   KETONESUR NEGATIVE 06/07/2016 0815   PROTEINUR  30 (A) 06/07/2016 0815   UROBILINOGEN 0.2 11/30/2014 1942   NITRITE NEGATIVE 06/07/2016 0815   LEUKOCYTESUR NEGATIVE 06/07/2016 0815      Time coordinating discharge: Over 30 minutes  SIGNED:  Vernell Leep, MD, FACP, FHM. [facility] Pager [phone]  If 7PM-7AM, please contact night-coverage [email or web address] Password [facility] 06/09/2016, 12:37 PM

## 2016-06-17 NOTE — ED Provider Notes (Signed)
06/07/16  Bedside ultrasound performed. Hypoechoic mass extending above the umbilicus identified in the lower abdomen. Consistent with distended bladder.   Julianne Rice, MD 06/17/16 1133

## 2016-06-19 ENCOUNTER — Encounter (HOSPITAL_COMMUNITY): Payer: Self-pay

## 2016-06-19 ENCOUNTER — Emergency Department (HOSPITAL_COMMUNITY)
Admission: EM | Admit: 2016-06-19 | Discharge: 2016-06-19 | Disposition: A | Discharge status: Home / Self Care | Payer: Medicare Other | Attending: Emergency Medicine | Admitting: Emergency Medicine

## 2016-06-19 DIAGNOSIS — I10 Essential (primary) hypertension: Secondary | ICD-10-CM | POA: Insufficient documentation

## 2016-06-19 DIAGNOSIS — Z7982 Long term (current) use of aspirin: Secondary | ICD-10-CM | POA: Insufficient documentation

## 2016-06-19 DIAGNOSIS — R339 Retention of urine, unspecified: Secondary | ICD-10-CM | POA: Insufficient documentation

## 2016-06-19 NOTE — Discharge Instructions (Signed)
Try to urinate every hour today. Return if you are having any problems.

## 2016-06-19 NOTE — ED Provider Notes (Signed)
[facility] DEPT Provider Note   CSN: [id number] Arrival date & time: 06/19/16  0301     History   Chief Complaint Chief Complaint  Patient presents with  . Follow-up    HPI John Shields is a 71 y.o. male.  He has an indwelling Foley catheter which was placed when he was hospitalized for urinary retention and acute kidney injury. Was 12 days ago. He states that he wants the catheter out. He states his penis is irritated where the catheter rubs against it. He had been evaluated for cause of urinary retention when he had an episode about 18 months ago, and he states that no cause was found. He expresses frustration at that. He denies abdominal pain, fever, chills.   The history is provided by the patient.    Past Medical History:  Diagnosis Date  . BPH (benign prostatic hyperplasia)   . Hypertension   . Urinary retention    pt reports this 2 years ago    Patient Active Problem List   Diagnosis Date Noted  . AKI (acute kidney injury) ([place])   . Urinary retention 06/07/2016  . Constipation 06/07/2016  . Acute renal failure (ARF) ([place]) 06/07/2016  . Essential hypertension 06/07/2016  . Hyperglycemia 06/07/2016    Past Surgical History:  Procedure Laterality Date  . NO PAST SURGERIES         Home Medications    Prior to Admission medications   Medication Sig Start Date End Date Taking? Authorizing Provider  amLODipine (NORVASC) 10 MG tablet Take 5 mg by mouth daily.    Historical Provider, MD  aspirin EC 81 MG tablet Take 162 mg by mouth daily.    Historical Provider, MD  cephALEXin (KEFLEX) 500 MG capsule Take 1 capsule (500 mg total) by mouth 3 (three) times daily. 06/09/16   Modena Jansky, MD  Multiple Vitamin (MULTIVITAMIN WITH MINERALS) TABS tablet Take 1 tablet by mouth daily.    Historical Provider, MD  polyethylene glycol (MIRALAX / GLYCOLAX) packet Take 17 g by mouth daily. 06/10/16   Modena Jansky, MD  senna (SENOKOT) 8.6 MG TABS tablet Take 2  tablets (17.2 mg total) by mouth daily as needed for moderate constipation. 06/09/16   Modena Jansky, MD    Family History Family History  Problem Relation Age of Onset  . Heart attack Father   . Heart attack Brother     Social History Social History  Substance Use Topics  . Smoking status: Never Smoker  . Smokeless tobacco: Never Used  . Alcohol use No     Allergies   Patient has no known allergies.   Review of Systems Review of Systems  All other systems reviewed and are negative.    Physical Exam Updated Vital Signs BP 134/81   Pulse 79   Temp 99 F (37.2 C) (Oral)   Resp 16   SpO2 96%   Physical Exam  Nursing note and vitals reviewed.  71 year old male, resting comfortably and in no acute distress. Vital signs are Normal. Oxygen saturation is 96%, which is normal. Head is normocephalic and atraumatic. PERRLA, EOMI. Oropharynx is clear. Neck is nontender and supple without adenopathy or JVD. Back is nontender and there is no CVA tenderness. Lungs are clear without rales, wheezes, or rhonchi. Chest is nontender. Heart has regular rate and rhythm without murmur. Abdomen is soft, flat, nontender without masses or hepatosplenomegaly and peristalsis is normoactive. Genitalia: Circumcised penis with Foley catheter in place. Mild  erythema at the urethral meatus. Extremities have no cyanosis or edema, full range of motion is present. Skin is warm and dry without rash. Neurologic: Mental status is normal, cranial nerves are intact, there are no motor or sensory deficits.  ED Treatments / Results   Procedures Procedures (including critical care time)  Medications Ordered in ED Medications - No data to display   Initial Impression / Assessment and Plan / ED Course  I have reviewed the triage vital signs and the nursing notes.  History of urinary retention with indwelling Foley in place. Old records are reviewed confirming recent hospitalization for urinary  retention and acute kidney injury, as well as episode of urinary retention in July 2016. I discussed with the patient possibility that he would have recurrent urinary retention and he does have the Foley catheter replaced. Patient expresses understanding but states he once to try anyway. He is referred back to urology for follow-up. Unfortunately, he had not been able to arrange follow-up following hospital discharge because he was not feeling well and there were whether issues as well. He states that he will call the urology office later today for follow-up appointment.  Final Clinical Impressions(s) / ED Diagnoses   Final diagnoses:  Urinary retention    New Prescriptions New Prescriptions   No medications on file     Delora Fuel, MD [id number]

## 2016-06-19 NOTE — ED Triage Notes (Signed)
Pt states seen here for urinary retention. Pt states received urinary catheter ~7 days ago. Pt states supposed schedule follow up appointment with Urology. Pt states has not had the energy to go to follow up appointment. Pt complaining of irritation from urinary bag. Pt requesting to have catheter removed. Pt with no other complaints.

## 2016-06-20 ENCOUNTER — Emergency Department (HOSPITAL_COMMUNITY)
Admission: EM | Admit: 2016-06-20 | Discharge: 2016-06-20 | Disposition: A | Discharge status: Home / Self Care | Payer: Non-veteran care | Attending: Emergency Medicine | Admitting: Emergency Medicine

## 2016-06-20 ENCOUNTER — Encounter (HOSPITAL_COMMUNITY): Payer: Self-pay | Admitting: Emergency Medicine

## 2016-06-20 DIAGNOSIS — Z7982 Long term (current) use of aspirin: Secondary | ICD-10-CM | POA: Diagnosis not present

## 2016-06-20 DIAGNOSIS — I10 Essential (primary) hypertension: Secondary | ICD-10-CM | POA: Insufficient documentation

## 2016-06-20 DIAGNOSIS — Z79899 Other long term (current) drug therapy: Secondary | ICD-10-CM | POA: Insufficient documentation

## 2016-06-20 DIAGNOSIS — R339 Retention of urine, unspecified: Secondary | ICD-10-CM | POA: Insufficient documentation

## 2016-06-20 LAB — I-STAT CHEM 8, ED
BUN: 16 mg/dL (ref 6–20)
CALCIUM ION: 1.2 mmol/L (ref 1.15–1.40)
CREATININE: 1.2 mg/dL (ref 0.61–1.24)
Chloride: 101 mmol/L (ref 101–111)
GLUCOSE: 113 mg/dL — AB (ref 65–99)
HCT: 41 % (ref 39.0–52.0)
Hemoglobin: 13.9 g/dL (ref 13.0–17.0)
POTASSIUM: 3.8 mmol/L (ref 3.5–5.1)
Sodium: 140 mmol/L (ref 135–145)
TCO2: 27 mmol/L (ref 0–100)

## 2016-06-20 LAB — URINALYSIS, ROUTINE W REFLEX MICROSCOPIC
BILIRUBIN URINE: NEGATIVE
Bacteria, UA: NONE SEEN
GLUCOSE, UA: NEGATIVE mg/dL
KETONES UR: NEGATIVE mg/dL
LEUKOCYTES UA: NEGATIVE
NITRITE: NEGATIVE
PH: 6 (ref 5.0–8.0)
PROTEIN: 100 mg/dL — AB
Specific Gravity, Urine: 1.013 (ref 1.005–1.030)
Squamous Epithelial / LPF: NONE SEEN

## 2016-06-20 MED ORDER — PHENAZOPYRIDINE HCL 100 MG PO TABS
95.0000 mg | ORAL_TABLET | Freq: Once | ORAL | Status: AC
  Administered 2016-06-20: 100 mg via ORAL
  Filled 2016-06-20: qty 1

## 2016-06-20 MED ORDER — TAMSULOSIN HCL 0.4 MG PO CAPS: 0.4000 mg | ORAL_CAPSULE | Freq: Every day | ORAL | 0 refills | Status: DC

## 2016-06-20 NOTE — ED Notes (Signed)
Pt states to RN that he can not take pain from bladder being full; bladder scan protocol order placed

## 2016-06-20 NOTE — Discharge Instructions (Signed)
It is very important that you follow-up with urology to have her Foley catheter removed and further testing done.

## 2016-06-20 NOTE — ED Notes (Signed)
MD at bedside at this time.

## 2016-06-20 NOTE — ED Provider Notes (Signed)
[facility] DEPT Provider Note   CSN: [id number] Arrival date & time: 06/20/16  0100     History   Chief Complaint Chief Complaint  Patient presents with  . Urinary Retention    HPI John Shields is a 71 y.o. male.  HPI  This is a 71 year old male with history of hypertension, BPH who presents with urinary retention. Recent multiple evaluations in the ED for urinary retention. He was admitted proximally 10 days ago for urinary retention, constipation, and acute kidney injury with creatinine of 5. Patient has a pattern of returning to the emergency room for Foley catheter removal prematurely. He subsequently has retention and returns for Foley catheter placement. He has not followed up with urology. He reports that he has had difficulty getting an appointment" there was some mix up." He states that he went to the urology office yesterday and "it has been worked out." However, he has not yet seen a urologist. He states that since Foley catheter removal yesterday at 3 AM, he has not had any urinary output. He denies dribbling. He reports suprapubic pressure. No back pain or fevers. Patient had a Foley catheter placed prior to my evaluation. He had greater than 800 mL output.  Past Medical History:  Diagnosis Date  . BPH (benign prostatic hyperplasia)   . Hypertension   . Urinary retention    pt reports this 2 years ago    Patient Active Problem List   Diagnosis Date Noted  . AKI (acute kidney injury) ([place])   . Urinary retention 06/07/2016  . Constipation 06/07/2016  . Acute renal failure (ARF) ([place]) 06/07/2016  . Essential hypertension 06/07/2016  . Hyperglycemia 06/07/2016    Past Surgical History:  Procedure Laterality Date  . NO PAST SURGERIES         Home Medications    Prior to Admission medications   Medication Sig Start Date End Date Taking? Authorizing Provider  amLODipine (NORVASC) 10 MG tablet Take 5 mg by mouth daily.    Historical Provider, MD  aspirin  EC 81 MG tablet Take 162 mg by mouth daily.    Historical Provider, MD  cephALEXin (KEFLEX) 500 MG capsule Take 1 capsule (500 mg total) by mouth 3 (three) times daily. 06/09/16   Modena Jansky, MD  Multiple Vitamin (MULTIVITAMIN WITH MINERALS) TABS tablet Take 1 tablet by mouth daily.    Historical Provider, MD  polyethylene glycol (MIRALAX / GLYCOLAX) packet Take 17 g by mouth daily. 06/10/16   Modena Jansky, MD  senna (SENOKOT) 8.6 MG TABS tablet Take 2 tablets (17.2 mg total) by mouth daily as needed for moderate constipation. 06/09/16   Modena Jansky, MD  tamsulosin (FLOMAX) 0.4 MG CAPS capsule Take 1 capsule (0.4 mg total) by mouth daily. 06/20/16   Merryl Hacker, MD    Family History Family History  Problem Relation Age of Onset  . Heart attack Father   . Heart attack Brother     Social History Social History  Substance Use Topics  . Smoking status: Never Smoker  . Smokeless tobacco: Never Used  . Alcohol use No     Allergies   Patient has no known allergies.   Review of Systems Review of Systems  Constitutional: Negative for fever.  Gastrointestinal: Positive for abdominal pain. Negative for nausea and vomiting.  Genitourinary: Positive for decreased urine volume. Negative for dysuria, flank pain and hematuria.  Musculoskeletal: Negative for back pain.  All other systems reviewed and are negative.  Physical Exam Updated Vital Signs BP 128/86 (BP Location: Left Arm)   Pulse 92   Temp 98.6 F (37 C) (Oral)   Resp 18   SpO2 97%   Physical Exam  Constitutional: He is oriented to person, place, and time. He appears well-developed and well-nourished. No distress.  HENT:  Head: Normocephalic and atraumatic.  Cardiovascular: Normal rate, regular rhythm and normal heart sounds.   No murmur heard. Pulmonary/Chest: Effort normal and breath sounds normal. No respiratory distress. He has no wheezes.  Abdominal: Soft. Bowel sounds are normal. There is  tenderness. There is no rebound.  Mild suprapubic tenderness to palpation without rebound or guarding  Genitourinary: Penis normal.  Genitourinary Comments: Foley catheter in place  Neurological: He is alert and oriented to person, place, and time.  Skin: Skin is warm and dry.  Psychiatric: He has a normal mood and affect.  Nursing note and vitals reviewed.    ED Treatments / Results  Labs (all labs ordered are listed, but only abnormal results are displayed) Labs Reviewed  URINALYSIS, ROUTINE W REFLEX MICROSCOPIC - Abnormal; Notable for the following:       Result Value   Hgb urine dipstick MODERATE (*)    Protein, ur 100 (*)    All other components within normal limits  I-STAT CHEM 8, ED - Abnormal; Notable for the following:    Glucose, Bld 113 (*)    All other components within normal limits  URINE CULTURE    EKG  EKG Interpretation None       Radiology No results found.  Procedures Procedures (including critical care time)  Medications Ordered in ED Medications  phenazopyridine (PYRIDIUM) tablet 100 mg (100 mg Oral Given 06/20/16 0315)     Initial Impression / Assessment and Plan / ED Course  I have reviewed the triage vital signs and the nursing notes.  Pertinent labs & imaging results that were available during my care of the patient were reviewed by me and considered in my medical decision making (see chart for details).    Patient presents with urinary retention. Recent history of the same. He has had difficulty following up with urology.  During recent admission, Dr. Alinda Money was consulted and was to arrange for follow-up. Patient states that "he thinks things have been worked out." He has put out greater than 800 mL with catheter placement. Creatinine 1.2. Urine without evidence of infection. I again stressed with the patient that he needs to follow-up with urology for urodynamic testing and Foley removal. Catheter likely needs to stay in place for at least  5 days if not longer. He was also started on Flomax.  After history, exam, and medical workup I feel the patient has been appropriately medically screened and is safe for discharge home. Pertinent diagnoses were discussed with the patient. Patient was given return precautions.   Final Clinical Impressions(s) / ED Diagnoses   Final diagnoses:  Urinary retention    New Prescriptions New Prescriptions   TAMSULOSIN (FLOMAX) 0.4 MG CAPS CAPSULE    Take 1 capsule (0.4 mg total) by mouth daily.     Merryl Hacker, MD 06/20/16 [phone]

## 2016-06-20 NOTE — ED Notes (Signed)
Signature pad unavailable.  Pt verbalized understanding of prescriptions, discharge instructions, and home care.  Pt understands to make follow up appointment with urology first thing today.

## 2016-06-20 NOTE — ED Notes (Signed)
Patient able to ambulate independently  

## 2016-06-20 NOTE — ED Triage Notes (Signed)
Pt was seen yesterday here in the ED and was requesting foley cath be removed.  Pt st's he has not voided since then.

## 2016-06-20 NOTE — ED Notes (Addendum)
EMT reports over 800 cc found in pt's bladder; pt has hx of retention with foley bag recently placed and removed on 06/19/16 via pt request;[place], Md gave new order for new foley placement due to pt hx and results of bladder scan; pt remains in triage room and still awaiting bed to be seen by MD; Charge RN aware of placement of foley by EMT

## 2016-06-21 LAB — URINE CULTURE: CULTURE: NO GROWTH

## 2016-06-22 DIAGNOSIS — R339 Retention of urine, unspecified: Secondary | ICD-10-CM | POA: Diagnosis not present

## 2016-07-18 DIAGNOSIS — R339 Retention of urine, unspecified: Secondary | ICD-10-CM | POA: Diagnosis not present

## 2016-07-18 DIAGNOSIS — N401 Enlarged prostate with lower urinary tract symptoms: Secondary | ICD-10-CM | POA: Diagnosis not present

## 2016-07-25 DIAGNOSIS — N401 Enlarged prostate with lower urinary tract symptoms: Secondary | ICD-10-CM | POA: Diagnosis not present

## 2016-07-25 DIAGNOSIS — R972 Elevated prostate specific antigen [PSA]: Secondary | ICD-10-CM | POA: Diagnosis not present

## 2016-07-25 DIAGNOSIS — R338 Other retention of urine: Secondary | ICD-10-CM | POA: Diagnosis not present

## 2016-08-29 DIAGNOSIS — R338 Other retention of urine: Secondary | ICD-10-CM | POA: Diagnosis not present

## 2016-08-30 DIAGNOSIS — R339 Retention of urine, unspecified: Secondary | ICD-10-CM | POA: Diagnosis not present

## 2016-10-03 DIAGNOSIS — R972 Elevated prostate specific antigen [PSA]: Secondary | ICD-10-CM | POA: Diagnosis not present

## 2016-10-03 DIAGNOSIS — R339 Retention of urine, unspecified: Secondary | ICD-10-CM | POA: Diagnosis not present

## 2016-10-16 ENCOUNTER — Other Ambulatory Visit: Payer: Self-pay | Admitting: Urology

## 2016-10-16 DIAGNOSIS — R3129 Other microscopic hematuria: Secondary | ICD-10-CM | POA: Diagnosis not present

## 2016-10-20 DIAGNOSIS — R3129 Other microscopic hematuria: Secondary | ICD-10-CM | POA: Diagnosis not present

## 2016-10-20 DIAGNOSIS — R972 Elevated prostate specific antigen [PSA]: Secondary | ICD-10-CM | POA: Diagnosis not present

## 2016-10-25 DIAGNOSIS — R339 Retention of urine, unspecified: Secondary | ICD-10-CM | POA: Diagnosis not present

## 2016-10-25 DIAGNOSIS — R972 Elevated prostate specific antigen [PSA]: Secondary | ICD-10-CM | POA: Diagnosis not present

## 2016-10-26 ENCOUNTER — Other Ambulatory Visit (HOSPITAL_COMMUNITY): Payer: Self-pay | Admitting: Urology

## 2016-10-26 DIAGNOSIS — R599 Enlarged lymph nodes, unspecified: Secondary | ICD-10-CM

## 2016-11-02 ENCOUNTER — Other Ambulatory Visit (HOSPITAL_COMMUNITY): Payer: Self-pay | Admitting: Urology

## 2016-11-14 ENCOUNTER — Other Ambulatory Visit: Payer: Self-pay | Admitting: Radiology

## 2016-11-15 ENCOUNTER — Other Ambulatory Visit: Payer: Self-pay | Admitting: Radiology

## 2016-11-16 ENCOUNTER — Encounter (HOSPITAL_COMMUNITY): Payer: Self-pay

## 2016-11-16 ENCOUNTER — Ambulatory Visit (HOSPITAL_COMMUNITY)
Admission: RE | Admit: 2016-11-16 | Discharge: 2016-11-16 | Disposition: A | Discharge status: Home / Self Care | Payer: Medicare Other | Source: Ambulatory Visit | Attending: Urology | Admitting: Urology

## 2016-11-16 DIAGNOSIS — R338 Other retention of urine: Secondary | ICD-10-CM | POA: Insufficient documentation

## 2016-11-16 DIAGNOSIS — I10 Essential (primary) hypertension: Secondary | ICD-10-CM | POA: Diagnosis not present

## 2016-11-16 DIAGNOSIS — M899 Disorder of bone, unspecified: Secondary | ICD-10-CM | POA: Insufficient documentation

## 2016-11-16 DIAGNOSIS — N401 Enlarged prostate with lower urinary tract symptoms: Secondary | ICD-10-CM | POA: Diagnosis not present

## 2016-11-16 DIAGNOSIS — Z79899 Other long term (current) drug therapy: Secondary | ICD-10-CM | POA: Diagnosis not present

## 2016-11-16 DIAGNOSIS — Z8249 Family history of ischemic heart disease and other diseases of the circulatory system: Secondary | ICD-10-CM | POA: Diagnosis not present

## 2016-11-16 DIAGNOSIS — R3129 Other microscopic hematuria: Secondary | ICD-10-CM | POA: Diagnosis not present

## 2016-11-16 DIAGNOSIS — R590 Localized enlarged lymph nodes: Secondary | ICD-10-CM | POA: Diagnosis not present

## 2016-11-16 DIAGNOSIS — Z7982 Long term (current) use of aspirin: Secondary | ICD-10-CM | POA: Insufficient documentation

## 2016-11-16 DIAGNOSIS — M7989 Other specified soft tissue disorders: Secondary | ICD-10-CM | POA: Diagnosis not present

## 2016-11-16 DIAGNOSIS — N2889 Other specified disorders of kidney and ureter: Secondary | ICD-10-CM | POA: Insufficient documentation

## 2016-11-16 DIAGNOSIS — R599 Enlarged lymph nodes, unspecified: Secondary | ICD-10-CM

## 2016-11-16 LAB — BASIC METABOLIC PANEL
ANION GAP: 10 (ref 5–15)
BUN: 11 mg/dL (ref 6–20)
CHLORIDE: 105 mmol/L (ref 101–111)
CO2: 26 mmol/L (ref 22–32)
Calcium: 9.5 mg/dL (ref 8.9–10.3)
Creatinine, Ser: 0.97 mg/dL (ref 0.61–1.24)
GFR calc Af Amer: >60 mL/min (ref >60)
GFR calc non Af Amer: >60 mL/min (ref >60)
GLUCOSE: 104 mg/dL — AB (ref 65–99)
POTASSIUM: 3.4 mmol/L — AB (ref 3.5–5.1)
Sodium: 141 mmol/L (ref 135–145)

## 2016-11-16 LAB — CBC WITH DIFFERENTIAL/PLATELET
Basophils Absolute: 0 10*3/uL (ref 0.0–0.1)
Basophils Relative: 0 %
EOS PCT: 2 %
Eosinophils Absolute: 0.1 10*3/uL (ref 0.0–0.7)
HEMATOCRIT: 43.8 % (ref 39.0–52.0)
HEMOGLOBIN: 14.8 g/dL (ref 13.0–17.0)
LYMPHS ABS: 1.7 10*3/uL (ref 0.7–4.0)
LYMPHS PCT: 30 %
MCH: 23.6 pg — ABNORMAL LOW (ref 26.0–34.0)
MCHC: 33.8 g/dL (ref 30.0–36.0)
MCV: 69.9 fL — AB (ref 78.0–100.0)
MONOS PCT: 9 %
Monocytes Absolute: 0.5 10*3/uL (ref 0.1–1.0)
Neutro Abs: 3.3 10*3/uL (ref 1.7–7.7)
Neutrophils Relative %: 59 %
Platelets: 173 10*3/uL (ref 150–400)
RBC: 6.27 MIL/uL — AB (ref 4.22–5.81)
RDW: 14.2 % (ref 11.5–15.5)
WBC: 5.6 10*3/uL (ref 4.0–10.5)

## 2016-11-16 LAB — PROTIME-INR
INR: 1
Prothrombin Time: 13.2 s (ref 11.4–15.2)

## 2016-11-16 MED ORDER — NALOXONE HCL 0.4 MG/ML IJ SOLN
INTRAMUSCULAR | Status: AC
  Filled 2016-11-16: qty 1

## 2016-11-16 MED ORDER — MIDAZOLAM HCL 2 MG/2ML IJ SOLN
INTRAMUSCULAR | Status: AC
  Filled 2016-11-16: qty 6

## 2016-11-16 MED ORDER — SODIUM CHLORIDE 0.9 % IV SOLN
INTRAVENOUS | Status: AC
  Filled 2016-11-16: qty 250

## 2016-11-16 MED ORDER — LIDOCAINE HCL 1 % IJ SOLN
10.0000 mL | Freq: Once | INTRAMUSCULAR | Status: AC
  Administered 2016-11-16: 10 mL

## 2016-11-16 MED ORDER — FENTANYL CITRATE (PF) 100 MCG/2ML IJ SOLN
INTRAMUSCULAR | Status: AC | PRN
  Administered 2016-11-16 (×2): 50 ug via INTRAVENOUS

## 2016-11-16 MED ORDER — SODIUM CHLORIDE 0.9 % IV SOLN
INTRAVENOUS | Status: DC
  Administered 2016-11-16 (×2): via INTRAVENOUS

## 2016-11-16 MED ORDER — FENTANYL CITRATE (PF) 100 MCG/2ML IJ SOLN
INTRAMUSCULAR | Status: AC
  Filled 2016-11-16: qty 6

## 2016-11-16 MED ORDER — HYDROCODONE-ACETAMINOPHEN 5-325 MG PO TABS: 1.0000 | ORAL_TABLET | ORAL | Status: DC | PRN

## 2016-11-16 MED ORDER — FLUMAZENIL 0.5 MG/5ML IV SOLN
INTRAVENOUS | Status: AC
  Filled 2016-11-16: qty 5

## 2016-11-16 MED ORDER — MIDAZOLAM HCL 2 MG/2ML IJ SOLN
INTRAMUSCULAR | Status: AC | PRN
  Administered 2016-11-16 (×2): 1 mg via INTRAVENOUS

## 2016-11-16 NOTE — Sedation Documentation (Signed)
Patient is resting comfortably. 

## 2016-11-16 NOTE — Progress Notes (Signed)
Hypertensive post procedure.  Pt states he has weaned and taken himself off his blood pressure medicine, his MD unaware. Discouraged Pt from discontinuing meds without MD order and the potential effects. Notified Dr Vernard Gambles.

## 2016-11-16 NOTE — Discharge Instructions (Signed)
Moderate Conscious Sedation, Adult, Care After °These instructions provide you with information about caring for yourself after your procedure. Your health care provider may also give you more specific instructions. Your treatment has been planned according to current medical practices, but problems sometimes occur. Call your health care provider if you have any problems or questions after your procedure. °What can I expect after the procedure? °After your procedure, it is common: °· To feel sleepy for several hours. °· To feel clumsy and have poor balance for several hours. °· To have poor judgment for several hours. °· To vomit if you eat too soon. ° °Follow these instructions at home: °For at least 24 hours after the procedure: ° °· Do not: °? Participate in activities where you could fall or become injured. °? Drive. °? Use heavy machinery. °? Drink alcohol. °? Take sleeping pills or medicines that cause drowsiness. °? Make important decisions or sign legal documents. °? Take care of children on your own. °· Rest. °Eating and drinking °· Follow the diet recommended by your health care provider. °· If you vomit: °? Drink water, juice, or soup when you can drink without vomiting. °? Make sure you have little or no nausea before eating solid foods. °General instructions °· Have a responsible adult stay with you until you are awake and alert. °· Take over-the-counter and prescription medicines only as told by your health care provider. °· If you smoke, do not smoke without supervision. °· Keep all follow-up visits as told by your health care provider. This is important. °Contact a health care provider if: °· You keep feeling nauseous or you keep vomiting. °· You feel light-headed. °· You develop a rash. °· You have a fever. °Get help right away if: °· You have trouble breathing. °This information is not intended to replace advice given to you by your health care provider. Make sure you discuss any questions you have  with your health care provider. °Document Released: 03/05/2013 Document Revised: 10/18/2015 Document Reviewed: 09/04/2015 °Elsevier Interactive Patient Education © 2018 [employer] °Axillary Lymph Node Dissection, Care After °This sheet gives you information about how to care for yourself after your procedure. Your health care provider may also give you more specific instructions. If you have problems or questions, contact your health care provider. °What can I expect after the procedure? °After the procedure, it is common to have: °· Pain and soreness around your incision area. °· Trouble moving your arm or shoulder. °· A small amount of swelling in your arm. °· Numbness on the upper and inside parts of your arm. ° °Follow these instructions at home: °Medicines °· Take over-the-counter and prescription medicines only as told by your health care provider. °· If you were prescribed an antibiotic medicine, take it as told by your health care provider. Do not stop taking the antibiotic even if you start to feel better °Incision care °· Follow instructions from your health care provider about how to take care of your incision. Make sure you: °? Wash your hands with soap and water before you change your bandage (dressing). If soap and water are not available, use hand sanitizer. °? Change your dressing as told by your health care provider. °? Leave stitches (sutures), skin glue, or adhesive strips in place. These skin closures may need to stay in place for 2 weeks or longer. If adhesive strip edges start to loosen and curl up, you may trim the loose edges. Do not remove adhesive strips completely unless   your health care provider tells you to do that. °· Check your incision area every day for signs of infection. Check for: °? Redness, swelling, or pain. °? Fluid or blood. °? Warmth. °? Pus or a bad smell. °Activity °· Do arm and shoulder exercises as told by your health care provider. This may prevent movement problems  and stiffness. °· Return to your normal activities as told by your health care provider. Ask your health care provider what activities are safe for you. Avoid any activities that cause pain. °Driving °· Do not drive for 24 hours if you were given a medicine to help you relax (sedative). °· Do not drive or use heavy machinery while taking prescription pain medicine. °General instructions °· If a drainage tube was left in your breast, care for it as told by your health care provider. The drain may stay in place for up to 7-10 days. °· Wear a compression garment on your arm as told by your health care provider. This may help to prevent blood clots and reduce swelling in your arm. °· Do not use any products that contain nicotine or tobacco, such as cigarettes and e-cigarettes. If you need help quitting, ask your health care provider. °· Do not take baths, swim, or use a hot tub until your health care provider approves. Ask your health care provider if you may take showers. You may only be allowed to take sponge baths for bathing. °· Keep all follow-up visits as told by your health care provider. This is important. °Contact a health care provider if: °· Your arm is swollen, tight, and painful. °· You have redness, swelling, or pain around your incision. °· You have fluid or blood coming from your incision. °· Your incision feels warm to the touch. °· You have pus or a bad smell coming from your incision. °Get help right away if: °· You have severe pain that does not get better with medicine. °· You have a fever or chills. °· You are confused. °· You have chest pain. °· You have shortness of breath. °Summary °· After the procedure, it is common to have pain and soreness and trouble moving your arm or shoulder. °· A small amount of arm swelling is normal after the procedure. However, you should contact your health care provider if your arm is swollen, tight, and painful. °· Wear a compression garment on your arm as told by  your health care provider. This may help to prevent blood clots and reduce swelling in your arm. °· Do arm and shoulder exercises as directed to help prevent movement problems and stiffness. °· If a drainage tube was left in your breast, care for it as told by your health care provider. °This information is not intended to replace advice given to you by your health care provider. Make sure you discuss any questions you have with your health care provider. °Document Released: 07/12/2016 Document Revised: 07/12/2016 Document Reviewed: 07/12/2016 °Elsevier Interactive Patient Education © 2018 [employer] ° °

## 2016-11-16 NOTE — Consult Note (Signed)
Chief Complaint: Patient was seen in consultation today for CT-guided right pelvic/iliac lymph node biopsy  Referring Physician(s): Tannenbaum,Sigmund  Supervising Physician: Arne Cleveland  Patient Status: [facility] - Out-pt  History of Present Illness: John Shields is a 71 y.o. male with history of BPH, urinary retention, hypertension, microhematuria and recent CT scan which reveals right renal mass, scattered bony sclerotic lesions, as well as pelvic lymphadenopathy and abnormal prostate appearance. He presents today for CT-guided right pelvic/iliac lymph node biopsy for further evaluation.  Past Medical History:  Diagnosis Date  . BPH (benign prostatic hyperplasia)   . Hypertension   . Urinary retention    pt reports this 2 years ago    Past Surgical History:  Procedure Laterality Date  . NO PAST SURGERIES      Allergies: Patient has no known allergies.  Medications: Prior to Admission medications   Medication Sig Start Date End Date Taking? Authorizing Provider  amLODipine (NORVASC) 10 MG tablet Take 5 mg by mouth daily.   Yes [provider]  aspirin EC 81 MG tablet Take 162 mg by mouth daily.   Yes [provider]  cephALEXin (KEFLEX) 500 MG capsule Take 1 capsule (500 mg total) by mouth 3 (three) times daily. 06/09/16  Yes Hongalgi, Lenis Dickinson, MD  Multiple Vitamin (MULTIVITAMIN WITH MINERALS) TABS tablet Take 1 tablet by mouth daily.   Yes [provider]  polyethylene glycol (MIRALAX / GLYCOLAX) packet Take 17 g by mouth daily. 06/10/16  Yes Hongalgi, Lenis Dickinson, MD  senna (SENOKOT) 8.6 MG TABS tablet Take 2 tablets (17.2 mg total) by mouth daily as needed for moderate constipation. 06/09/16  Yes Hongalgi, Lenis Dickinson, MD  tamsulosin (FLOMAX) 0.4 MG CAPS capsule Take 1 capsule (0.4 mg total) by mouth daily. 06/20/16  Yes Horton, Barbette Hair, MD     Family History  Problem Relation Age of Onset  . Heart attack Father   . Heart attack Brother      Social History   Social History  . Marital status: Married    Spouse name: N/A  . Number of children: N/A  . Years of education: N/A   Social History Main Topics  . Smoking status: Never Smoker  . Smokeless tobacco: Never Used  . Alcohol use No  . Drug use: No  . Sexual activity: Not Currently   Other Topics Concern  . None   Social History Narrative  . None      Review of Systems Denies fever, headache, chest pain, dyspnea, cough, abdominal/back pain, nausea, vomiting.  Vital Signs: BP (!) 190/104 (BP Location: Right Arm)   Pulse 77   Temp 97.2 F (36.2 C) (Oral)   Resp 18   SpO2 99%   Physical Exam awake, alert. Chest with slightly diminished breath sounds right base, left clear. Heart with regular rate and rhythm. Abdomen soft, positive bowel sounds, nontender. Lower extremities with trace pretibial edema bilaterally.  Mallampati Score:     Imaging: No results found.  Labs:  CBC:  Recent Labs  06/07/16 0735 06/08/16 0635 06/20/16 0438  WBC 11.1* 7.8  --   HGB 15.0 14.0 13.9  HCT 42.4 40.8 41.0  PLT 175 178  --     COAGS: No results for input(s): INR, APTT in the last 8760 hours.  BMP:  Recent Labs  06/07/16 0735 06/08/16 0635 06/08/16 1841 06/09/16 0515 06/20/16 0438  NA 132* 138 138 140 140  K 4.9 4.0 4.2 3.9 3.8  CL  93* 103 101 104 101  CO2 20* 25 26 28   --   GLUCOSE 133* 96 136* 105* 113*  BUN 76* 41* 33* 22* 16  CALCIUM 8.9 8.3* 8.0* 8.3*  --   CREATININE 5.27* 1.75* 1.62* 1.28* 1.20  GFRNONAA 10* 38* 41* 55*  --   GFRAA 12* 44* 48* >60  --     LIVER FUNCTION TESTS:  Recent Labs  06/07/16 0735  BILITOT 0.3  AST 23  ALT 23  ALKPHOS 45  PROT 7.0  ALBUMIN 3.6    TUMOR MARKERS: No results for input(s): AFPTM, CEA, CA199, CHROMGRNA in the last 8760 hours.  Assessment and Plan: 71 y.o. male with history of BPH, urinary retention, hypertension, microhematuria and recent CT scan which reveals right renal mass,  scattered bony sclerotic lesions, as well as pelvic lymphadenopathy and abnormal prostate appearance. He presents today for CT-guided right pelvic/iliac lymph node biopsy for further evaluation.Risks and benefits discussed with the patient including, but not limited to bleeding, infection, damage to adjacent structures or low yield requiring additional tests.All of the patient's questions were answered, patient is agreeable to proceed.Consent signed and in chart.     Thank you for this interesting consult.  I greatly enjoyed meeting Pearly Apachito and look forward to participating in their care.  A copy of this report was sent to the requesting provider on this date.  Electronically Signed: D. Rowe Robert, PA-C 11/16/2016, 9:04 AM   I spent a total of 25 minutes in face to face in clinical consultation, greater than 50% of which was counseling/coordinating care for CT-guided right pelvic/iliac lymph node biopsy

## 2016-11-16 NOTE — Procedures (Signed)
  CT core R pelvic LAN No complication No blood loss. See complete dictation in [facility].  Dillard Cannon MD Main # [phone] Pager  [phone]

## 2016-11-20 ENCOUNTER — Ambulatory Visit (HOSPITAL_COMMUNITY): Admission: RE | Admit: 2016-11-20 | Payer: PRIVATE HEALTH INSURANCE | Source: Ambulatory Visit | Admitting: Urology

## 2016-11-20 ENCOUNTER — Encounter (HOSPITAL_COMMUNITY): Admission: RE | Payer: Self-pay | Source: Ambulatory Visit

## 2016-11-20 SURGERY — PROSTATECTOMY, RETROPUBIC
Anesthesia: General

## 2016-11-28 DIAGNOSIS — R339 Retention of urine, unspecified: Secondary | ICD-10-CM | POA: Diagnosis not present

## 2016-11-28 DIAGNOSIS — R972 Elevated prostate specific antigen [PSA]: Secondary | ICD-10-CM | POA: Diagnosis not present

## 2016-12-19 DIAGNOSIS — R972 Elevated prostate specific antigen [PSA]: Secondary | ICD-10-CM | POA: Diagnosis not present

## 2016-12-19 DIAGNOSIS — C775 Secondary and unspecified malignant neoplasm of intrapelvic lymph nodes: Secondary | ICD-10-CM | POA: Diagnosis not present

## 2016-12-19 DIAGNOSIS — C641 Malignant neoplasm of right kidney, except renal pelvis: Secondary | ICD-10-CM | POA: Diagnosis not present

## 2016-12-19 DIAGNOSIS — C61 Malignant neoplasm of prostate: Secondary | ICD-10-CM | POA: Diagnosis not present

## 2016-12-19 DIAGNOSIS — R338 Other retention of urine: Secondary | ICD-10-CM | POA: Diagnosis not present

## 2016-12-23 ENCOUNTER — Encounter (HOSPITAL_COMMUNITY)
Admission: EM | Disposition: A | Discharge status: Home / Self Care | Payer: Self-pay | Source: Home / Self Care | Attending: Emergency Medicine

## 2016-12-23 ENCOUNTER — Emergency Department (HOSPITAL_COMMUNITY): Payer: Medicare Other

## 2016-12-23 ENCOUNTER — Emergency Department (HOSPITAL_COMMUNITY)
Admission: EM | Admit: 2016-12-23 | Discharge: 2016-12-24 | Disposition: A | Discharge status: Home / Self Care | Payer: Medicare Other | Source: Home / Self Care | Attending: Emergency Medicine | Admitting: Emergency Medicine

## 2016-12-23 ENCOUNTER — Encounter (HOSPITAL_COMMUNITY): Payer: Self-pay | Admitting: Emergency Medicine

## 2016-12-23 DIAGNOSIS — Z79899 Other long term (current) drug therapy: Secondary | ICD-10-CM

## 2016-12-23 DIAGNOSIS — R0789 Other chest pain: Secondary | ICD-10-CM | POA: Diagnosis not present

## 2016-12-23 DIAGNOSIS — R7989 Other specified abnormal findings of blood chemistry: Secondary | ICD-10-CM | POA: Diagnosis not present

## 2016-12-23 DIAGNOSIS — R112 Nausea with vomiting, unspecified: Secondary | ICD-10-CM | POA: Insufficient documentation

## 2016-12-23 DIAGNOSIS — I10 Essential (primary) hypertension: Secondary | ICD-10-CM | POA: Insufficient documentation

## 2016-12-23 DIAGNOSIS — Z7982 Long term (current) use of aspirin: Secondary | ICD-10-CM | POA: Insufficient documentation

## 2016-12-23 LAB — BASIC METABOLIC PANEL
Anion gap: 11 (ref 5–15)
BUN: 23 mg/dL — ABNORMAL HIGH (ref 6–20)
CALCIUM: 8.7 mg/dL — AB (ref 8.9–10.3)
CO2: 23 mmol/L (ref 22–32)
CREATININE: 1.25 mg/dL — AB (ref 0.61–1.24)
Chloride: 101 mmol/L (ref 101–111)
GFR calc Af Amer: >60 mL/min (ref >60)
GFR, EST NON AFRICAN AMERICAN: 57 mL/min — AB (ref >60)
GLUCOSE: 136 mg/dL — AB (ref 65–99)
Potassium: 4 mmol/L (ref 3.5–5.1)
Sodium: 135 mmol/L (ref 135–145)

## 2016-12-23 LAB — HEPATIC FUNCTION PANEL
ALT: 24 U/L (ref 17–63)
AST: 32 U/L (ref 15–41)
Albumin: 3.1 g/dL — ABNORMAL LOW (ref 3.5–5.0)
Alkaline Phosphatase: 129 U/L — ABNORMAL HIGH (ref 38–126)
Bilirubin, Direct: 0.3 mg/dL (ref 0.1–0.5)
Indirect Bilirubin: 0.9 mg/dL (ref 0.3–0.9)
Total Bilirubin: 1.2 mg/dL (ref 0.3–1.2)
Total Protein: 6.6 g/dL (ref 6.5–8.1)

## 2016-12-23 LAB — CBC
HCT: 36.8 % — ABNORMAL LOW (ref 39.0–52.0)
HEMOGLOBIN: 12.5 g/dL — AB (ref 13.0–17.0)
MCH: 23.1 pg — ABNORMAL LOW (ref 26.0–34.0)
MCHC: 34 g/dL (ref 30.0–36.0)
MCV: 67.9 fL — AB (ref 78.0–100.0)
Platelets: 131 10*3/uL — ABNORMAL LOW (ref 150–400)
RBC: 5.42 MIL/uL (ref 4.22–5.81)
RDW: 13.8 % (ref 11.5–15.5)
WBC: 4.9 10*3/uL (ref 4.0–10.5)

## 2016-12-23 LAB — I-STAT TROPONIN, ED
TROPONIN I, POC: 0.01 ng/mL (ref 0.00–0.08)
Troponin i, poc: 0.01 ng/mL (ref 0.00–0.08)

## 2016-12-23 LAB — LIPASE, BLOOD: LIPASE: 28 U/L (ref 11–51)

## 2016-12-23 LAB — D-DIMER, QUANTITATIVE: D-Dimer, Quant: 8.96 ug/mL-FEU — ABNORMAL HIGH (ref 0.00–0.50)

## 2016-12-23 LAB — PROTIME-INR
INR: 1.21
Prothrombin Time: 15.4 s — ABNORMAL HIGH (ref 11.4–15.2)

## 2016-12-23 SURGERY — INVASIVE LAB ABORTED CASE

## 2016-12-23 MED ORDER — IOPAMIDOL (ISOVUE-370) INJECTION 76%
INTRAVENOUS | Status: AC
  Administered 2016-12-23: 100 mL via INTRAVENOUS
  Filled 2016-12-23: qty 100

## 2016-12-23 NOTE — ED Provider Notes (Signed)
[facility] DEPT Provider Note   CSN: [id number] Arrival date & time: 12/23/16  1532     History   Chief Complaint Chief Complaint  Patient presents with  . Code STEMI    HPI John Shields is a 71 y.o. male.  The history is provided by the patient, the EMS personnel and medical records.  Emesis   This is a new problem. The current episode started less than 1 hour ago. The problem occurs 2 to 4 times per day. The problem has been resolved. The emesis has an appearance of stomach contents. There has been no fever. Associated symptoms include sweats. Pertinent negatives include no abdominal pain, no chills, no cough, no diarrhea, no fever, no headaches, no myalgias and no URI.    Past Medical History:  Diagnosis Date  . BPH (benign prostatic hyperplasia)   . Hypertension   . Urinary retention    pt reports this 2 years ago    Patient Active Problem List   Diagnosis Date Noted  . AKI (acute kidney injury) ([place])   . Urinary retention 06/07/2016  . Constipation 06/07/2016  . Acute renal failure (ARF) ([place]) 06/07/2016  . Essential hypertension 06/07/2016  . Hyperglycemia 06/07/2016    Past Surgical History:  Procedure Laterality Date  . NO PAST SURGERIES         Home Medications    Prior to Admission medications   Medication Sig Start Date End Date Taking? Authorizing Provider  amLODipine (NORVASC) 10 MG tablet Take 5 mg by mouth daily.    [provider]  aspirin EC 81 MG tablet Take 162 mg by mouth daily.    [provider]  cephALEXin (KEFLEX) 500 MG capsule Take 1 capsule (500 mg total) by mouth 3 (three) times daily. 06/09/16   Hongalgi, Lenis Dickinson, MD  Multiple Vitamin (MULTIVITAMIN WITH MINERALS) TABS tablet Take 1 tablet by mouth daily.    [provider]  polyethylene glycol (MIRALAX / GLYCOLAX) packet Take 17 g by mouth daily. 06/10/16   Hongalgi, Lenis Dickinson, MD  senna (SENOKOT) 8.6 MG TABS tablet Take 2 tablets (17.2 mg total) by  mouth daily as needed for moderate constipation. 06/09/16   Hongalgi, Lenis Dickinson, MD  tamsulosin (FLOMAX) 0.4 MG CAPS capsule Take 1 capsule (0.4 mg total) by mouth daily. 06/20/16   Horton, Barbette Hair, MD    Family History Family History  Problem Relation Age of Onset  . Heart attack Father   . Heart attack Brother     Social History Social History  Substance Use Topics  . Smoking status: Never Smoker  . Smokeless tobacco: Never Used  . Alcohol use No     Allergies   Patient has no known allergies.   Review of Systems Review of Systems  Constitutional: Positive for diaphoresis. Negative for appetite change, chills, fatigue and fever.  HENT: Negative for congestion.   Respiratory: Negative for cough, chest tightness, shortness of breath, wheezing and stridor.   Cardiovascular: Negative for chest pain, palpitations and leg swelling.  Gastrointestinal: Positive for nausea and vomiting. Negative for abdominal distention, abdominal pain, constipation and diarrhea.  Genitourinary: Negative for dysuria and flank pain.  Musculoskeletal: Negative for back pain, myalgias, neck pain and neck stiffness.  Skin: Negative for rash and wound.  Neurological: Negative for light-headedness, numbness and headaches.  Psychiatric/Behavioral: Negative for agitation.  All other systems reviewed and are negative.    Physical Exam Updated Vital Signs BP 136/89 (BP Location: Left Arm)  Pulse (!) 126   Temp 99.9 F (37.7 C) (Oral)   Resp (!) 31   SpO2 96%   Physical Exam  Constitutional: He is oriented to person, place, and time. He appears well-developed and well-nourished. No distress.  HENT:  Head: Normocephalic and atraumatic.  Nose: Nose normal.  Mouth/Throat: Oropharynx is clear and moist. No oropharyngeal exudate.  Eyes: Pupils are equal, round, and reactive to light. Conjunctivae and EOM are normal.  Neck: Normal range of motion. Neck supple.  Cardiovascular: Normal rate, regular  rhythm and intact distal pulses.   No murmur heard. Pulmonary/Chest: Effort normal and breath sounds normal. No stridor. No respiratory distress. He has no wheezes. He has no rales. He exhibits no tenderness.  Abdominal: Soft. He exhibits no distension. There is no tenderness.  Musculoskeletal: He exhibits no edema or tenderness.  Neurological: He is alert and oriented to person, place, and time. No cranial nerve deficit.  Skin: Skin is warm and dry. Capillary refill takes less than 2 seconds. He is not diaphoretic. No erythema.  Psychiatric: He has a normal mood and affect.  Nursing note and vitals reviewed.    ED Treatments / Results  Labs (all labs ordered are listed, but only abnormal results are displayed) Labs Reviewed  BASIC METABOLIC PANEL - Abnormal; Notable for the following:       Result Value   Glucose, Bld 136 (*)    BUN 23 (*)    Creatinine, Ser 1.25 (*)    Calcium 8.7 (*)    GFR calc non Af Amer 57 (*)    All other components within normal limits  CBC - Abnormal; Notable for the following:    Hemoglobin 12.5 (*)    HCT 36.8 (*)    MCV 67.9 (*)    MCH 23.1 (*)    Platelets 131 (*)    All other components within normal limits  HEPATIC FUNCTION PANEL - Abnormal; Notable for the following:    Albumin 3.1 (*)    Alkaline Phosphatase 129 (*)    All other components within normal limits  PROTIME-INR - Abnormal; Notable for the following:    Prothrombin Time 15.4 (*)    All other components within normal limits  D-DIMER, QUANTITATIVE (NOT AT [facility]) - Abnormal; Notable for the following:    D-Dimer, Quant 8.96 (*)    All other components within normal limits  LIPASE, BLOOD  I-STAT TROPONIN, ED  I-STAT TROPONIN, ED    EKG  EKG Interpretation  Date/Time:  Saturday December 23 2016 23:22:24 EDT Ventricular Rate:  99 PR Interval:  190 QRS Duration: 136 QT Interval:  336 QTC Calculation: 431 R Axis:   -51 Text Interpretation:  Normal sinus rhythm Left axis  deviation Left ventricular hypertrophy with QRS widening Nonspecific T wave abnormality Abnormal ECG when compared to prior, no significant changes seen.  No STEMI Confirmed by Antony Blackbird [phone]) on 12/24/2016 12:18:04 AM       Radiology Ct Angio Chest Pe W And/or Wo Contrast  Result Date: 12/23/2016 CLINICAL DATA:  Elevated D-dimer level. EXAM: CT ANGIOGRAPHY CHEST WITH CONTRAST TECHNIQUE: Multidetector CT imaging of the chest was performed using the standard protocol during bolus administration of intravenous contrast. Multiplanar CT image reconstructions and MIPs were obtained to evaluate the vascular anatomy. CONTRAST:  100 mL of Isovue 370 intravenously. COMPARISON:  None. FINDINGS: Cardiovascular: Satisfactory opacification of the pulmonary arteries to the segmental level. No evidence of pulmonary embolism. Normal heart size. No pericardial effusion. Mediastinum/Nodes:  No enlarged mediastinal, hilar, or axillary lymph nodes. Thyroid gland, trachea, and esophagus demonstrate no significant findings. Lungs/Pleura: Lungs are clear. No pleural effusion or pneumothorax. Upper Abdomen: No acute abnormality. Musculoskeletal: No chest wall abnormality. No acute or significant osseous findings. Review of the MIP images confirms the above findings. IMPRESSION: No definite evidence of pulmonary embolus. No acute cardiopulmonary abnormality seen. Electronically Signed   By: Marijo Conception, M.D.   On: 12/23/2016 20:21    Procedures Procedures (including critical care time)  Medications Ordered in ED Medications  iopamidol (ISOVUE-370) 76 % injection (100 mLs Intravenous Contrast Given 12/23/16 1945)     Initial Impression / Assessment and Plan / ED Course  I have reviewed the triage vital signs and the nursing notes.  Pertinent labs & imaging results that were available during my care of the patient were reviewed by me and considered in my medical decision making (see chart for details).      John Shields is a 71 y.o. male with a past medical history significant for BPH and urinary retention with chronic Foley placement as well as current workup for possible cancer who presents with nausea, vomiting, lightheadedness, and was activated as a code STEMI due to EKG in the field with EMS. According to patient, he was leaving [employer] today when he had sudden onset of nausea and vomiting vomiting all over his vehicle. He said he felt lightheaded. He denied chest pain or shortness of breath but due to the vigorous vomiting he called for help. Upon EMS evaluation, patient had EKG showing STEMI. Patient was quickly brought to the ED. Patient received aspirin in route.  Patient denied any preceding chest pain or shortness of breath. He denies any current chest pain or shortness of breath. He only reported the episode of nausea and vomiting. He reports he is currently feeling his baseline. No abdominal pain or back pain.  On exam, patient has Foley in place. Patient's abdomen is nontender. Chest is nontender. Lungs are clear on exam. No significant abnormalities otherwise on exam.  Cardiology arrived shortly after arrival. They canceled code STEMI. They felt EKG was likely LVH causing the appearance of STEMI. They requested patient had workup to look for other etiologies of his nausea and vomiting.  Patient will have screening laboratory testing to look for etiology. Patient was given nausea medicine if he has more vomiting.       Diagnostic testing results are seen above. Troponin negative times two. D dimer elevated. CT PE study was ordered with no evidence of pulmonary embolism. Lipase nonelevated and hepatic function and only significant for alk phos elevation.  EKG continued to appear unchanged.  Patient was given oral fluids with improvement in symptoms. Patient had no further nausea or vomiting. While getting ready for discharge, patient felt sweaty and tremulous. Patient was given more  oral fluids with resolution of symptoms. Patient continued to have unchanged EKG.  Patient was allowed to rest and observed for period of time. Patient had no further episodes of nausea, vomiting, or other complaints. Given patient's reassuring workup, patient felt stable for discharge for outpatient PCP follow-up. Patient understood strict return precautions and had no other questions or concerns. Patient was discharged in good condition.     Final Clinical Impressions(s) / ED Diagnoses   Final diagnoses:  Non-intractable vomiting with nausea, unspecified vomiting type    New Prescriptions Discharge Medication List as of 12/24/2016 12:21 AM      Clinical Impression: 1. Non-intractable vomiting  with nausea, unspecified vomiting type     Disposition: Discharge  Condition: Good  I have discussed the results, Dx and Tx plan with the pt(& family if present). He/she/they expressed understanding and agree(s) with the plan. Discharge instructions discussed at great length. Strict return precautions discussed and pt &/or family have verbalized understanding of the instructions. No further questions at time of discharge.    Discharge Medication List as of 12/24/2016 12:21 AM      Follow Up: [facility], [facility] [place] [id number] [phone]  Schedule an appointment as soon as possible for a visit    [place] [address] [id number] [place] [phone]  If symptoms worsen     Tegeler, Gwenyth Allegra, MD 12/24/16 1222

## 2016-12-23 NOTE — ED Notes (Signed)
The pt was c/o feeling sweaty and nervoous  approx 15 minutes ago.  He is ok now  ekg done and given to the doctor  Family at the bedsise

## 2016-12-23 NOTE — ED Notes (Signed)
Pt repeatedly asking for something to eat/drink. Explained to pt we need the provider to reassess pt and pt's labs before pt is able to have anything to eat or drink. Pt states "I haven't had anything since yesterday."

## 2016-12-23 NOTE — ED Notes (Signed)
Doctor at the bedside 

## 2016-12-23 NOTE — ED Notes (Signed)
The pt is alert no pain no distress  Just taken to c-t

## 2016-12-23 NOTE — Discharge Instructions (Addendum)
Please call to follow-up with your primary care physician in the next several days. If any symptoms change or worsen, please return to the nearest emergency department.

## 2016-12-23 NOTE — ED Triage Notes (Signed)
Patient presents today with complaints of vomiting. Patient reports he was in [place] and began to fell "sick". Patient reports he starting vomiting. Patient denies any Chest pain Shortness of Breath . Patient denies any nausea on arrival. Patient alert oriented x4 . Patient moving all extremities.

## 2016-12-23 NOTE — ED Notes (Signed)
Delay in lab draw,  Pt not in room at this time. 

## 2016-12-23 NOTE — ED Notes (Signed)
No pain

## 2016-12-27 ENCOUNTER — Inpatient Hospital Stay (HOSPITAL_COMMUNITY)
Admission: EM | Admit: 2016-12-27 | Discharge: 2017-01-02 | DRG: 872 | Disposition: A | Discharge status: Home / Self Care | Payer: Medicare Other | Attending: Family Medicine | Admitting: Family Medicine

## 2016-12-27 ENCOUNTER — Emergency Department (HOSPITAL_COMMUNITY): Payer: Medicare Other

## 2016-12-27 ENCOUNTER — Encounter (HOSPITAL_COMMUNITY): Payer: Self-pay | Admitting: Emergency Medicine

## 2016-12-27 DIAGNOSIS — Z8249 Family history of ischemic heart disease and other diseases of the circulatory system: Secondary | ICD-10-CM | POA: Diagnosis not present

## 2016-12-27 DIAGNOSIS — Z23 Encounter for immunization: Secondary | ICD-10-CM

## 2016-12-27 DIAGNOSIS — I10 Essential (primary) hypertension: Secondary | ICD-10-CM | POA: Diagnosis not present

## 2016-12-27 DIAGNOSIS — B961 Klebsiella pneumoniae [K. pneumoniae] as the cause of diseases classified elsewhere: Secondary | ICD-10-CM | POA: Diagnosis present

## 2016-12-27 DIAGNOSIS — C641 Malignant neoplasm of right kidney, except renal pelvis: Secondary | ICD-10-CM | POA: Diagnosis present

## 2016-12-27 DIAGNOSIS — A419 Sepsis, unspecified organism: Principal | ICD-10-CM | POA: Diagnosis present

## 2016-12-27 DIAGNOSIS — N401 Enlarged prostate with lower urinary tract symptoms: Secondary | ICD-10-CM | POA: Diagnosis present

## 2016-12-27 DIAGNOSIS — I447 Left bundle-branch block, unspecified: Secondary | ICD-10-CM | POA: Diagnosis not present

## 2016-12-27 DIAGNOSIS — E872 Acidosis, unspecified: Secondary | ICD-10-CM | POA: Insufficient documentation

## 2016-12-27 DIAGNOSIS — D6959 Other secondary thrombocytopenia: Secondary | ICD-10-CM | POA: Diagnosis present

## 2016-12-27 DIAGNOSIS — R05 Cough: Secondary | ICD-10-CM | POA: Diagnosis not present

## 2016-12-27 DIAGNOSIS — C61 Malignant neoplasm of prostate: Secondary | ICD-10-CM | POA: Diagnosis not present

## 2016-12-27 DIAGNOSIS — B952 Enterococcus as the cause of diseases classified elsewhere: Secondary | ICD-10-CM | POA: Diagnosis present

## 2016-12-27 DIAGNOSIS — R739 Hyperglycemia, unspecified: Secondary | ICD-10-CM | POA: Diagnosis present

## 2016-12-27 DIAGNOSIS — J90 Pleural effusion, not elsewhere classified: Secondary | ICD-10-CM | POA: Diagnosis not present

## 2016-12-27 DIAGNOSIS — N2889 Other specified disorders of kidney and ureter: Secondary | ICD-10-CM | POA: Diagnosis not present

## 2016-12-27 DIAGNOSIS — D696 Thrombocytopenia, unspecified: Secondary | ICD-10-CM

## 2016-12-27 DIAGNOSIS — R509 Fever, unspecified: Secondary | ICD-10-CM

## 2016-12-27 DIAGNOSIS — N412 Abscess of prostate: Secondary | ICD-10-CM | POA: Diagnosis present

## 2016-12-27 DIAGNOSIS — R7989 Other specified abnormal findings of blood chemistry: Secondary | ICD-10-CM | POA: Diagnosis not present

## 2016-12-27 DIAGNOSIS — R339 Retention of urine, unspecified: Secondary | ICD-10-CM | POA: Diagnosis present

## 2016-12-27 DIAGNOSIS — R000 Tachycardia, unspecified: Secondary | ICD-10-CM | POA: Diagnosis not present

## 2016-12-27 DIAGNOSIS — N400 Enlarged prostate without lower urinary tract symptoms: Secondary | ICD-10-CM

## 2016-12-27 DIAGNOSIS — R1011 Right upper quadrant pain: Secondary | ICD-10-CM | POA: Diagnosis not present

## 2016-12-27 DIAGNOSIS — E860 Dehydration: Secondary | ICD-10-CM | POA: Diagnosis present

## 2016-12-27 DIAGNOSIS — R945 Abnormal results of liver function studies: Secondary | ICD-10-CM

## 2016-12-27 DIAGNOSIS — D72825 Bandemia: Secondary | ICD-10-CM

## 2016-12-27 DIAGNOSIS — R5082 Postprocedural fever: Secondary | ICD-10-CM | POA: Diagnosis not present

## 2016-12-27 DIAGNOSIS — R0602 Shortness of breath: Secondary | ICD-10-CM | POA: Diagnosis not present

## 2016-12-27 DIAGNOSIS — R338 Other retention of urine: Secondary | ICD-10-CM | POA: Diagnosis not present

## 2016-12-27 DIAGNOSIS — Z8619 Personal history of other infectious and parasitic diseases: Secondary | ICD-10-CM

## 2016-12-27 DIAGNOSIS — C799 Secondary malignant neoplasm of unspecified site: Secondary | ICD-10-CM | POA: Diagnosis not present

## 2016-12-27 DIAGNOSIS — I509 Heart failure, unspecified: Secondary | ICD-10-CM | POA: Diagnosis not present

## 2016-12-27 HISTORY — DX: Personal history of other infectious and parasitic diseases: Z86.19

## 2016-12-27 LAB — PROTIME-INR
INR: 1.06
Prothrombin Time: 13.8 seconds (ref 11.4–15.2)

## 2016-12-27 LAB — CBC WITH DIFFERENTIAL/PLATELET
BASOS ABS: 0 10*3/uL (ref 0.0–0.1)
BASOS PCT: 0 %
Eosinophils Absolute: 0.3 10*3/uL (ref 0.0–0.7)
Eosinophils Relative: 3 %
HEMATOCRIT: 36.7 % — AB (ref 39.0–52.0)
HEMOGLOBIN: 12.9 g/dL — AB (ref 13.0–17.0)
LYMPHS PCT: 6 %
Lymphs Abs: 0.5 10*3/uL — ABNORMAL LOW (ref 0.7–4.0)
MCH: 22.8 pg — ABNORMAL LOW (ref 26.0–34.0)
MCHC: 35.1 g/dL (ref 30.0–36.0)
MCV: 64.7 fL — ABNORMAL LOW (ref 78.0–100.0)
Monocytes Absolute: 0.1 10*3/uL (ref 0.1–1.0)
Monocytes Relative: 1 %
NEUTROS ABS: 8 10*3/uL — AB (ref 1.7–7.7)
Neutrophils Relative %: 90 %
Platelets: 87 10*3/uL — ABNORMAL LOW (ref 150–400)
RBC: 5.67 MIL/uL (ref 4.22–5.81)
RDW: 13.8 % (ref 11.5–15.5)
WBC: 8.9 10*3/uL (ref 4.0–10.5)

## 2016-12-27 LAB — COMPREHENSIVE METABOLIC PANEL
ALK PHOS: 323 U/L — AB (ref 38–126)
ALT: 64 U/L — AB (ref 17–63)
AST: 75 U/L — AB (ref 15–41)
Albumin: 2.5 g/dL — ABNORMAL LOW (ref 3.5–5.0)
Anion gap: 9 (ref 5–15)
BILIRUBIN TOTAL: 1.9 mg/dL — AB (ref 0.3–1.2)
BUN: 12 mg/dL (ref 6–20)
CALCIUM: 8.3 mg/dL — AB (ref 8.9–10.3)
CHLORIDE: 102 mmol/L (ref 101–111)
CO2: 24 mmol/L (ref 22–32)
CREATININE: 1.08 mg/dL (ref 0.61–1.24)
GFR calc Af Amer: >60 mL/min (ref >60)
Glucose, Bld: 126 mg/dL — ABNORMAL HIGH (ref 65–99)
Potassium: 3.5 mmol/L (ref 3.5–5.1)
Sodium: 135 mmol/L (ref 135–145)
Total Protein: 6.1 g/dL — ABNORMAL LOW (ref 6.5–8.1)

## 2016-12-27 LAB — URINALYSIS, ROUTINE W REFLEX MICROSCOPIC
Bacteria, UA: NONE SEEN
Bilirubin Urine: NEGATIVE
GLUCOSE, UA: NEGATIVE mg/dL
Ketones, ur: NEGATIVE mg/dL
LEUKOCYTES UA: NEGATIVE
Nitrite: NEGATIVE
PROTEIN: NEGATIVE mg/dL
Specific Gravity, Urine: 1.005 (ref 1.005–1.030)
Squamous Epithelial / LPF: NONE SEEN
pH: 7 (ref 5.0–8.0)

## 2016-12-27 LAB — PROCALCITONIN: Procalcitonin: 72.22 ng/mL

## 2016-12-27 LAB — I-STAT CG4 LACTIC ACID, ED
LACTIC ACID, VENOUS: 2.93 mmol/L — AB (ref 0.5–1.9)
LACTIC ACID, VENOUS: 3.72 mmol/L — AB (ref 0.5–1.9)

## 2016-12-27 LAB — LACTIC ACID, PLASMA: Lactic Acid, Venous: 1.6 mmol/L (ref 0.5–1.9)

## 2016-12-27 MED ORDER — SODIUM CHLORIDE 0.9 % IV BOLUS (SEPSIS)
1000.0000 mL | Freq: Once | INTRAVENOUS | Status: AC
  Administered 2016-12-27: 1000 mL via INTRAVENOUS

## 2016-12-27 MED ORDER — CEFTRIAXONE SODIUM 1 G IJ SOLR
1.0000 g | INTRAMUSCULAR | Status: DC
  Filled 2016-12-27: qty 10

## 2016-12-27 MED ORDER — DEXTROSE 5 % IV SOLN
2.0000 g | Freq: Once | INTRAVENOUS | Status: AC
  Administered 2016-12-27: 2 g via INTRAVENOUS
  Filled 2016-12-27: qty 2

## 2016-12-27 MED ORDER — IBUPROFEN 400 MG PO TABS
600.0000 mg | ORAL_TABLET | Freq: Once | ORAL | Status: AC
  Administered 2016-12-27: 600 mg via ORAL
  Filled 2016-12-27: qty 1

## 2016-12-27 MED ORDER — SODIUM CHLORIDE 0.9 % IV BOLUS (SEPSIS): 1000.0000 mL | Freq: Once | INTRAVENOUS | Status: DC

## 2016-12-27 MED ORDER — ACETAMINOPHEN 650 MG RE SUPP: 650.0000 mg | Freq: Four times a day (QID) | RECTAL | Status: DC | PRN

## 2016-12-27 MED ORDER — ONDANSETRON HCL 4 MG PO TABS: 4.0000 mg | ORAL_TABLET | Freq: Four times a day (QID) | ORAL | Status: DC | PRN

## 2016-12-27 MED ORDER — ONDANSETRON HCL 4 MG/2ML IJ SOLN: 4.0000 mg | Freq: Four times a day (QID) | INTRAMUSCULAR | Status: DC | PRN

## 2016-12-27 MED ORDER — SODIUM CHLORIDE 0.9 % IV SOLN
INTRAVENOUS | Status: DC
  Administered 2016-12-27: 15:00:00 via INTRAVENOUS

## 2016-12-27 MED ORDER — ACETAMINOPHEN 325 MG PO TABS
650.0000 mg | ORAL_TABLET | Freq: Four times a day (QID) | ORAL | Status: DC | PRN
  Administered 2016-12-28 – 2016-12-31 (×3): 650 mg via ORAL
  Filled 2016-12-27 (×4): qty 2

## 2016-12-27 MED ORDER — BISACODYL 10 MG RE SUPP: 10.0000 mg | Freq: Every day | RECTAL | Status: DC | PRN

## 2016-12-27 MED ORDER — SENNOSIDES-DOCUSATE SODIUM 8.6-50 MG PO TABS
1.0000 | ORAL_TABLET | Freq: Every evening | ORAL | Status: DC | PRN
  Administered 2016-12-30: 1 via ORAL
  Filled 2016-12-27: qty 1

## 2016-12-27 MED ORDER — HEPARIN SODIUM (PORCINE) 5000 UNIT/ML IJ SOLN: 5000.0000 [IU] | Freq: Three times a day (TID) | INTRAMUSCULAR | Status: DC

## 2016-12-27 NOTE — ED Notes (Signed)
Pt to restroom

## 2016-12-27 NOTE — ED Notes (Signed)
Got patient undress on the monitor did ekg shown to Dr Christy Gentles

## 2016-12-27 NOTE — ED Notes (Signed)
Dinner tray ordered.

## 2016-12-27 NOTE — ED Triage Notes (Signed)
Pt in from home via [facility] with c/o sob at 0600. Pt c/o dry, np cough and had fever of 102 oral for EMS. Hx of urinary retention, states his catheter has not been changed in "6 mo's". A&ox3, denies n/v/d or cp. Sats 92% on RA, BP 126/76

## 2016-12-27 NOTE — H&P (Signed)
History and Physical    John Shields XBJ:[id number] DOB: 07-15-1945 DOA: 12/27/2016   PCP: [facility]   Patient coming from:  Home    Chief Complaint: Malaise, shortness of breath and fever  HPI: John Shields is a 71 y.o. male with medical history significant for HTN, BPH, Urinary retention with self catheterization (not changed for about 6 months), presenting with increasing shortness of breath, fever upto 102.3, diaphoresis, malaise/generalized weakness. This was preceded by a presentation to the emergency department and 12/23/2016, when nausea vomiting lightheadedness, but without syncope or falls  Tn was negative and CT angio was negative for PE. He was discharged home in stable condition after IVF administration. On today's visit, his symptoms are unresolved. He reports dry cough. Denies lower extremity swelling.  Denies abdominal pain. Appetite is normal. Denies any dysuria. Denies abnormal skin rashes. Denies sick contacts or recent trips or insect bites. Denies any bleeding issues such as epistaxis, hematemesis or hematochezia. He denies gross hematuria , His main complaint is "not feeling well".   ED Course:  BP 100/72   Pulse (!) 104   Temp (!) 102.3 F (39.1 C) (Rectal)   Resp (!) 28   Ht 6' (1.829 m)   Wt 90.7 kg (200 lb)   SpO2 94%   BMI 27.12 kg/m   UA is negative CXR NAD  sodium 135 potassium 3.5 glucose 126 creatinine 1.08  alkaline phosphatase 323 abdominal ultrasound pending lactic acid 2.93, now 3.72 WBC 8.9  Hb 12.9  Plts 87   Review of Systems:  As per HPI otherwise all other systems reviewed and are negative  Past Medical History:  Diagnosis Date  . BPH (benign prostatic hyperplasia)   . Hypertension   . Urinary retention    pt reports this 2 years ago    Past Surgical History:  Procedure Laterality Date  . NO PAST SURGERIES      Social History Social History   Social History  . Marital status: Married    Spouse name: N/A  .  Number of children: N/A  . Years of education: N/A   Occupational History  . Not on file.   Social History Main Topics  . Smoking status: Never Smoker  . Smokeless tobacco: Never Used  . Alcohol use No  . Drug use: No  . Sexual activity: Not Currently   Other Topics Concern  . Not on file   Social History Narrative  . No narrative on file     No Known Allergies  Family History  Problem Relation Age of Onset  . Heart attack Father   . Heart attack Brother       Prior to Admission medications   Medication Sig Start Date End Date Taking? Authorizing Provider  polyethylene glycol (MIRALAX / GLYCOLAX) packet Take 17 g by mouth daily. Patient not taking: Reported on 12/27/2016 06/10/16   Modena Jansky, MD  senna (SENOKOT) 8.6 MG TABS tablet Take 2 tablets (17.2 mg total) by mouth daily as needed for moderate constipation. Patient not taking: Reported on 12/27/2016 06/09/16   Modena Jansky, MD  tamsulosin (FLOMAX) 0.4 MG CAPS capsule Take 1 capsule (0.4 mg total) by mouth daily. Patient not taking: Reported on 12/27/2016 06/20/16   Merryl Hacker, MD    Physical Exam:  Vitals:   12/27/16 1315 12/27/16 1330 12/27/16 1345 12/27/16 1400  BP: 120/76 128/76 107/67 100/72  Pulse: (!) 112 (!) 112 (!) 108 (!) 104  Resp: (!) 41 Marland Kitchen)  39 (!) 35 (!) 28  Temp:      TempSrc:      SpO2: 95% 96% 94% 94%  Weight:      Height:       Constitutional: NAD, calm, ill appearing, diaphoretic  e Eyes: PERRL, lids and conjunctivae normal ENMT: Mucous membranes are moist, without exudate or lesions  Neck: normal, supple, no masses, no thyromegaly Respiratory: clear to auscultation bilaterally, no wheezing, no crackles. Normal respiratory effort  Cardiovascular: Regular rate and rhythm, soft 1/6 murmurs, rubs or gallops. No extremity edema. 2+ pedal pulses. No carotid bruits.  Abdomen: Soft, non tender, No hepatosplenomegaly. Bowel sounds positive.  Musculoskeletal: no clubbing / cyanosis.  Moves all extremities Skin: no jaundice, No lesions. Diaphoretic  Neurologic: Sensation intact  Strength equal in all extremities Psychiatric:   Alert and oriented x 3 . Normal mood     Labs on Admission: I have personally reviewed following labs and imaging studies  CBC:  Recent Labs Lab 12/23/16 1546 12/27/16 0849  WBC 4.9 8.9  NEUTROABS  --  8.0*  HGB 12.5* 12.9*  HCT 36.8* 36.7*  MCV 67.9* 64.7*  PLT 131* 87*    Basic Metabolic Panel:  Recent Labs Lab 12/23/16 1546 12/27/16 0849  NA 135 135  K 4.0 3.5  CL 101 102  CO2 23 24  GLUCOSE 136* 126*  BUN 23* 12  CREATININE 1.25* 1.08  CALCIUM 8.7* 8.3*    GFR: Estimated Creatinine Clearance: 69.9 mL/min (by C-G formula based on SCr of 1.08 mg/dL).  Liver Function Tests:  Recent Labs Lab 12/23/16 1707 12/27/16 0849  AST 32 75*  ALT 24 64*  ALKPHOS 129* 323*  BILITOT 1.2 1.9*  PROT 6.6 6.1*  ALBUMIN 3.1* 2.5*    Recent Labs Lab 12/23/16 1707  LIPASE 28   No results for input(s): AMMONIA in the last 168 hours.  Coagulation Profile:  Recent Labs Lab 12/23/16 1707 12/27/16 0849  INR 1.21 1.06    Cardiac Enzymes: No results for input(s): CKTOTAL, CKMB, CKMBINDEX, TROPONINI in the last 168 hours.  BNP (last 3 results) No results for input(s): PROBNP in the last 8760 hours.  HbA1C: No results for input(s): HGBA1C in the last 72 hours.  CBG: No results for input(s): GLUCAP in the last 168 hours.  Lipid Profile: No results for input(s): CHOL, HDL, LDLCALC, TRIG, CHOLHDL, LDLDIRECT in the last 72 hours.  Thyroid Function Tests: No results for input(s): TSH, T4TOTAL, FREET4, T3FREE, THYROIDAB in the last 72 hours.  Anemia Panel: No results for input(s): VITAMINB12, FOLATE, FERRITIN, TIBC, IRON, RETICCTPCT in the last 72 hours.  Urine analysis:    Component Value Date/Time   COLORURINE YELLOW 12/27/2016 [place] 12/27/2016 1256   LABSPEC 1.005 12/27/2016 1256    PHURINE 7.0 12/27/2016 1256   GLUCOSEU NEGATIVE 12/27/2016 1256   HGBUR MODERATE (A) 12/27/2016 1256   BILIRUBINUR NEGATIVE 12/27/2016 1256   KETONESUR NEGATIVE 12/27/2016 1256   PROTEINUR NEGATIVE 12/27/2016 1256   UROBILINOGEN 0.2 11/30/2014 1942   NITRITE NEGATIVE 12/27/2016 1256   LEUKOCYTESUR NEGATIVE 12/27/2016 1256    Sepsis Labs: @LABRCNTIP (procalcitonin:4,lacticidven:4) )No results found for this or any previous visit (from the past 240 hour(s)).   Radiological Exams on Admission: US Abdomen Limited  Result Date: 12/27/2016 CLINICAL DATA:  Right upper quadrant pain beginning at 6 a.m. today. History of hypertension. EXAM: ULTRASOUND ABDOMEN LIMITED RIGHT UPPER QUADRANT COMPARISON:  Abdominal and pelvic CT scan of Oct 20, 2016 FINDINGS: Gallbladder:  The gallbladder is adequately distended. No stones or sludge are observed. There is no gallbladder wall thickening or pericholecystic fluid. There is no positive sonographic Murphy sign. Common bile duct: Diameter: 5.3 mm Liver: The hepatic echotexture is normal. There is no focal mass nor ductal dilation. IMPRESSION: Normal limited right upper quadrant ultrasound examination. Electronically Signed   By: David  Martinique M.D.   On: 12/27/2016 12:20   Dg Chest Portable 1 View  Result Date: 12/27/2016 CLINICAL DATA:  Cough and shortness of breath EXAM: PORTABLE CHEST 1 VIEW COMPARISON:  Chest CT December 23, 2016 FINDINGS: There is no edema or consolidation. Heart is upper limits normal in size with pulmonary vascularity within normal limits. No adenopathy. No evident bone lesions. IMPRESSION: No edema or consolidation. Electronically Signed   By: Lowella Grip III M.D.   On: 12/27/2016 09:31    EKG: Independently reviewed.  Assessment/Plan Active Problems:   Sepsis ([place])   Urinary retention   Essential hypertension   Hyperglycemia   BPH (benign prostatic hyperplasia)   Renal mass   Thrombocytopenia ([facility])    Sepsis likely urine  source, in a patient with a history of self catheterization over the last 6 months and has not had cath exchange, Renal mass per biopsy. Of note UA is neg but has been obtained after antibiotic inititation  Patient meets criteria given tachycardia, tachypnea, and evidence of organ dysfunction. WBC 8.9  Antibiotics delivered in the ED with Rocephin .  Initial Lactic acid 2.93, now 3.72. Alk Phos 323 T max 102.3    EKG Sinus Tach no ACS . 2 L of normal saline to date  Admit to SDU Sepsis order set  Check Influenza by PCR  IV antibiotics by pharmacy with IT Rocephin Follow lactic acid q 3 hrs Follow blood and urine cultures IV fluids at 100 cc/h.  Procalcitonin order set  Remove current catheter and replace by nursing    Thrombocytopenia Likely due to infection, dilution  Current plt 87 k (was normal till June 2018)  No transfusion is indicated at this time Monitor counts closely  Transfuse 1 unit of platelets if count is less or equal than 10,000 or 20,000 if the patient is acutely bleeding    Hyperbilirubinemia, in the setting of Dehydration,  RUQ [place] negative T bil 1.9  . AST 75/ ALT 64   No jaundice noted. REceived 2 L IVF  IVF  Repeat CMET in am   Hyperglycemia, no h/o DM. Glu 126  Check A1C   DVT prophylaxis:   SCD's    Code Status:   Full     Family Communication:  Discussed with patient Disposition Plan: Expect patient to be discharged to home after condition improves Consults called:   None   Admission status: SDU    [facility], PA-C [facility]   12/27/2016, 2:46 PM

## 2016-12-27 NOTE — ED Notes (Signed)
Patient to u/s 

## 2016-12-28 ENCOUNTER — Encounter (HOSPITAL_COMMUNITY): Payer: Self-pay | Admitting: Family Medicine

## 2016-12-28 ENCOUNTER — Inpatient Hospital Stay (HOSPITAL_COMMUNITY): Payer: Medicare Other

## 2016-12-28 DIAGNOSIS — D696 Thrombocytopenia, unspecified: Secondary | ICD-10-CM

## 2016-12-28 DIAGNOSIS — I10 Essential (primary) hypertension: Secondary | ICD-10-CM

## 2016-12-28 DIAGNOSIS — R739 Hyperglycemia, unspecified: Secondary | ICD-10-CM

## 2016-12-28 DIAGNOSIS — R339 Retention of urine, unspecified: Secondary | ICD-10-CM

## 2016-12-28 DIAGNOSIS — A419 Sepsis, unspecified organism: Principal | ICD-10-CM

## 2016-12-28 LAB — COMPREHENSIVE METABOLIC PANEL
ALBUMIN: 2.2 g/dL — AB (ref 3.5–5.0)
ALT: 67 U/L — ABNORMAL HIGH (ref 17–63)
AST: 72 U/L — AB (ref 15–41)
Alkaline Phosphatase: 106 U/L (ref 38–126)
Anion gap: 7 (ref 5–15)
BILIRUBIN TOTAL: 0.8 mg/dL (ref 0.3–1.2)
BUN: 15 mg/dL (ref 6–20)
CHLORIDE: 106 mmol/L (ref 101–111)
CO2: 23 mmol/L (ref 22–32)
Calcium: 7.9 mg/dL — ABNORMAL LOW (ref 8.9–10.3)
Creatinine, Ser: 1.09 mg/dL (ref 0.61–1.24)
GFR calc Af Amer: >60 mL/min (ref >60)
GFR calc non Af Amer: >60 mL/min (ref >60)
GLUCOSE: 140 mg/dL — AB (ref 65–99)
POTASSIUM: 3.8 mmol/L (ref 3.5–5.1)
SODIUM: 136 mmol/L (ref 135–145)
TOTAL PROTEIN: 5.7 g/dL — AB (ref 6.5–8.1)

## 2016-12-28 LAB — SAVE SMEAR

## 2016-12-28 LAB — CBC
HEMATOCRIT: 30.3 % — AB (ref 39.0–52.0)
HEMOGLOBIN: 10.5 g/dL — AB (ref 13.0–17.0)
MCH: 22.4 pg — AB (ref 26.0–34.0)
MCHC: 34.7 g/dL (ref 30.0–36.0)
MCV: 64.6 fL — AB (ref 78.0–100.0)
Platelets: 97 10*3/uL — ABNORMAL LOW (ref 150–400)
RBC: 4.69 MIL/uL (ref 4.22–5.81)
RDW: 13.8 % (ref 11.5–15.5)
WBC: 21.3 10*3/uL — ABNORMAL HIGH (ref 4.0–10.5)

## 2016-12-28 LAB — HEMOGLOBIN A1C
Hgb A1c MFr Bld: 6.1 % — ABNORMAL HIGH (ref 4.8–5.6)
Mean Plasma Glucose: 128 mg/dL

## 2016-12-28 LAB — PROTIME-INR
INR: 1.1
Prothrombin Time: 14.2 seconds (ref 11.4–15.2)

## 2016-12-28 LAB — INFLUENZA PANEL BY PCR (TYPE A & B)
INFLAPCR: NEGATIVE
Influenza B By PCR: NEGATIVE

## 2016-12-28 LAB — MRSA PCR SCREENING: MRSA by PCR: NEGATIVE

## 2016-12-28 LAB — LACTIC ACID, PLASMA: LACTIC ACID, VENOUS: 1.7 mmol/L (ref 0.5–1.9)

## 2016-12-28 MED ORDER — SODIUM CHLORIDE 0.9 % IV SOLN
1500.0000 mg | Freq: Once | INTRAVENOUS | Status: AC
  Administered 2016-12-28: 1500 mg via INTRAVENOUS
  Filled 2016-12-28: qty 1500

## 2016-12-28 MED ORDER — DEXTROSE 5 % IV SOLN
1.0000 g | Freq: Three times a day (TID) | INTRAVENOUS | Status: AC
  Administered 2016-12-28 – 2016-12-30 (×7): 1 g via INTRAVENOUS
  Filled 2016-12-28 (×7): qty 1

## 2016-12-28 MED ORDER — PNEUMOCOCCAL VAC POLYVALENT 25 MCG/0.5ML IJ INJ
0.5000 mL | INJECTION | INTRAMUSCULAR | Status: AC
  Administered 2017-01-01: 0.5 mL via INTRAMUSCULAR
  Filled 2016-12-28: qty 0.5

## 2016-12-28 MED ORDER — IOPAMIDOL (ISOVUE-300) INJECTION 61%
INTRAVENOUS | Status: AC
  Administered 2016-12-28: 100 mL via INTRAVENOUS
  Filled 2016-12-28: qty 100

## 2016-12-28 MED ORDER — VANCOMYCIN HCL 10 G IV SOLR
1250.0000 mg | Freq: Two times a day (BID) | INTRAVENOUS | Status: DC
  Administered 2016-12-28 – 2016-12-29 (×3): 1250 mg via INTRAVENOUS
  Filled 2016-12-28 (×5): qty 1250

## 2016-12-28 MED ORDER — OXYCODONE HCL 5 MG PO TABS
5.0000 mg | ORAL_TABLET | ORAL | Status: DC | PRN
  Administered 2016-12-28 – 2016-12-31 (×11): 5 mg via ORAL
  Filled 2016-12-28 (×11): qty 1

## 2016-12-28 MED ORDER — MORPHINE SULFATE (PF) 2 MG/ML IV SOLN
1.0000 mg | Freq: Four times a day (QID) | INTRAVENOUS | Status: DC | PRN
  Administered 2016-12-28 – 2016-12-29 (×4): 1 mg via INTRAVENOUS
  Filled 2016-12-28 (×4): qty 1

## 2016-12-28 NOTE — Progress Notes (Signed)
RN received phone call of results of CT scan of pelvis. Wynetta Emery, MD made aware of results.

## 2016-12-28 NOTE — Consult Note (Signed)
Reason for Consult: Fevers After Prostate Biopsy, Metastatic Prostate Cancer, Urinary Retention  Referring Physician: Harriett Sine MD  John Shields is an 71 y.o. male.   HPI:   1 -  Metastatic Prostate Cancer - PSA 400 2018. Pelvic node biopsy 10/2016 inconclusive. TRUS BX 11/2016 333 mL with 12/12 cores upt to 90% Gleason 8 adenocarcinoma. Alk PHos and Cr normal 2018.   2 - Urinary Retention / Massive Prostate - urinary retention, catheter dependant since 2018. Prostate Vol 329m by CT ellipsoid calculation 2018. Likely metastatic prostate cancer in massive prostate as per above.   3 - Fevers After Prostate Biopsy / Likely Prostate Abscess - fevers, leukocytosis, tachycardia 12/2016 after prostate biopsy 12/19/16. [place]. Placed on empirc Vanc + Cefepime. He received Bactrim / GNorva Karvonenat time of biopsy. No prior positive UCX's for review. CT 8/2 with liklely mid-apical gland abscess.   Today "John Shields is seen for consultation of above.     Past Medical History:  Diagnosis Date  . BPH (benign prostatic hyperplasia)   . Hypertension   . Urinary retention    pt reports this 2 years ago    Past Surgical History:  Procedure Laterality Date  . NO PAST SURGERIES      Family History  Problem Relation Age of Onset  . Heart attack Father   . Heart attack Brother     Social History:  reports that he has never smoked. He has never used smokeless tobacco. He reports that he does not drink alcohol or use drugs.  Allergies: No Known Allergies  Medications: I have reviewed the patient's current medications.  Results for orders placed or performed during the hospital encounter of 12/27/16 (from the past 48 hour(s))  Comprehensive metabolic panel     Status: Abnormal   Collection Time: 12/27/16  8:49 AM  Result Value Ref Range   Sodium 135 135 - 145 mmol/L   Potassium 3.5 3.5 - 5.1 mmol/L   Chloride 102 101 - 111 mmol/L   CO2 24 22 - 32 mmol/L   Glucose, Bld 126 (H) 65  - 99 mg/dL   BUN 12 6 - 20 mg/dL   Creatinine, Ser 1.08 0.61 - 1.24 mg/dL   Calcium 8.3 (L) 8.9 - 10.3 mg/dL   Total Protein 6.1 (L) 6.5 - 8.1 g/dL   Albumin 2.5 (L) 3.5 - 5.0 g/dL   AST 75 (H) 15 - 41 U/L   ALT 64 (H) 17 - 63 U/L   Alkaline Phosphatase 323 (H) 38 - 126 U/L   Total Bilirubin 1.9 (H) 0.3 - 1.2 mg/dL   GFR calc non Af Amer >60 >60 mL/min   GFR calc Af Amer >60 >60 mL/min    Comment: (NOTE) The eGFR has been calculated using the CKD EPI equation. This calculation has not been validated in all clinical situations. eGFR's persistently <60 mL/min signify possible Chronic Kidney Disease.    Anion gap 9 5 - 15  CBC with Differential     Status: Abnormal   Collection Time: 12/27/16  8:49 AM  Result Value Ref Range   WBC 8.9 4.0 - 10.5 K/uL   RBC 5.67 4.22 - 5.81 MIL/uL   Hemoglobin 12.9 (L) 13.0 - 17.0 g/dL   HCT 36.7 (L) 39.0 - 52.0 %   MCV 64.7 (L) 78.0 - 100.0 fL   MCH 22.8 (L) 26.0 - 34.0 pg   MCHC 35.1 30.0 - 36.0 g/dL   RDW 13.8 11.5 - 15.5 %  Platelets 87 (L) 150 - 400 K/uL    Comment: SPECIMEN CHECKED FOR CLOTS REPEATED TO VERIFY PLATELET COUNT CONFIRMED BY SMEAR    Neutrophils Relative % 90 %   Lymphocytes Relative 6 %   Monocytes Relative 1 %   Eosinophils Relative 3 %   Basophils Relative 0 %   Neutro Abs 8.0 (H) 1.7 - 7.7 K/uL   Lymphs Abs 0.5 (L) 0.7 - 4.0 K/uL   Monocytes Absolute 0.1 0.1 - 1.0 K/uL   Eosinophils Absolute 0.3 0.0 - 0.7 K/uL   Basophils Absolute 0.0 0.0 - 0.1 K/uL   WBC Morphology MILD LEFT SHIFT (1-5% METAS, OCC MYELO, OCC BANDS)     Comment: TOXIC GRANULATION VACUOLATED NEUTROPHILS   Protime-INR     Status: None   Collection Time: 12/27/16  8:49 AM  Result Value Ref Range   Prothrombin Time 13.8 11.4 - 15.2 seconds   INR 1.06   I-Stat CG4 Lactic Acid, ED     Status: Abnormal   Collection Time: 12/27/16  9:09 AM  Result Value Ref Range   Lactic Acid, Venous 2.93 (HH) 0.5 - 1.9 mmol/L   Comment NOTIFIED PHYSICIAN    Urinalysis, Routine w reflex microscopic     Status: Abnormal   Collection Time: 12/27/16 12:56 PM  Result Value Ref Range   Color, Urine YELLOW YELLOW   APPearance CLEAR CLEAR   Specific Gravity, Urine 1.005 1.005 - 1.030   pH 7.0 5.0 - 8.0   Glucose, UA NEGATIVE NEGATIVE mg/dL   Hgb urine dipstick MODERATE (A) NEGATIVE   Bilirubin Urine NEGATIVE NEGATIVE   Ketones, ur NEGATIVE NEGATIVE mg/dL   Protein, ur NEGATIVE NEGATIVE mg/dL   Nitrite NEGATIVE NEGATIVE   Leukocytes, UA NEGATIVE NEGATIVE   RBC / HPF 6-30 0 - 5 RBC/hpf   WBC, UA 0-5 0 - 5 WBC/hpf   Bacteria, UA NONE SEEN NONE SEEN   Squamous Epithelial / LPF NONE SEEN NONE SEEN  I-Stat CG4 Lactic Acid, ED     Status: Abnormal   Collection Time: 12/27/16  1:06 PM  Result Value Ref Range   Lactic Acid, Venous 3.72 (HH) 0.5 - 1.9 mmol/L   Comment NOTIFIED PHYSICIAN   Lactic acid, plasma     Status: None   Collection Time: 12/27/16  8:21 PM  Result Value Ref Range   Lactic Acid, Venous 1.6 0.5 - 1.9 mmol/L  Hemoglobin A1c     Status: Abnormal   Collection Time: 12/27/16  8:21 PM  Result Value Ref Range   Hgb A1c MFr Bld 6.1 (H) 4.8 - 5.6 %    Comment: (NOTE)         Pre-diabetes: 5.7 - 6.4         Diabetes: >6.4         Glycemic control for adults with diabetes: <7.0    Mean Plasma Glucose 128 mg/dL    Comment: (NOTE) Performed At: [facility] [place], [place] [id number] Lindon Romp MD FG:[id number]   Procalcitonin - Baseline     Status: None   Collection Time: 12/27/16  8:21 PM  Result Value Ref Range   Procalcitonin 72.22 ng/mL    Comment:        Interpretation: PCT >= 10 ng/mL: Important systemic inflammatory response, almost exclusively due to severe bacterial sepsis or septic shock. (NOTE)         ICU PCT Algorithm  Non ICU PCT Algorithm    ----------------------------     ------------------------------         PCT < 0.25 ng/mL                 PCT < 0.1 ng/mL      Stopping of antibiotics            Stopping of antibiotics       strongly encouraged.               strongly encouraged.    ----------------------------     ------------------------------       PCT level decrease by               PCT < 0.25 ng/mL       >= 80% from peak PCT       OR PCT 0.25 - 0.5 ng/mL          Stopping of antibiotics                                             encouraged.     Stopping of antibiotics           encouraged.    ----------------------------     ------------------------------       PCT level decrease by              PCT >= 0.25 ng/mL       < 80% from peak PCT        AND PCT >= 0.5 ng/mL             Continuing antibiotics                                              encouraged.       Continuing antibiotics            encouraged.    ----------------------------     ------------------------------     PCT level increase compared          PCT > 0.5 ng/mL         with peak PCT AND          PCT >= 0.5 ng/mL             Escalation of antibiotics                                          strongly encouraged.      Escalation of antibiotics        strongly encouraged.   Influenza panel by PCR (type A & B)     Status: None   Collection Time: 12/27/16  8:51 PM  Result Value Ref Range   Influenza A By PCR NEGATIVE NEGATIVE   Influenza B By PCR NEGATIVE NEGATIVE    Comment: (NOTE) The Xpert Xpress Flu assay is intended as an aid in the diagnosis of  influenza and should not be used as a sole basis for treatment.  This  assay is FDA approved for nasopharyngeal swab specimens only. Nasal  washings and aspirates are unacceptable for Xpert Xpress Flu testing.   MRSA PCR  Screening     Status: None   Collection Time: 12/28/16 12:17 AM  Result Value Ref Range   MRSA by PCR NEGATIVE NEGATIVE    Comment:        The GeneXpert MRSA Assay (FDA approved for NASAL specimens only), is one component of a comprehensive MRSA colonization surveillance program. It is  not intended to diagnose MRSA infection nor to guide or monitor treatment for MRSA infections.   Lactic acid, plasma     Status: None   Collection Time: 12/28/16  1:37 AM  Result Value Ref Range   Lactic Acid, Venous 1.7 0.5 - 1.9 mmol/L  Comprehensive metabolic panel     Status: Abnormal   Collection Time: 12/28/16  1:37 AM  Result Value Ref Range   Sodium 136 135 - 145 mmol/L   Potassium 3.8 3.5 - 5.1 mmol/L   Chloride 106 101 - 111 mmol/L   CO2 23 22 - 32 mmol/L   Glucose, Bld 140 (H) 65 - 99 mg/dL   BUN 15 6 - 20 mg/dL   Creatinine, Ser 1.09 0.61 - 1.24 mg/dL   Calcium 7.9 (L) 8.9 - 10.3 mg/dL   Total Protein 5.7 (L) 6.5 - 8.1 g/dL   Albumin 2.2 (L) 3.5 - 5.0 g/dL   AST 72 (H) 15 - 41 U/L   ALT 67 (H) 17 - 63 U/L   Alkaline Phosphatase 106 38 - 126 U/L   Total Bilirubin 0.8 0.3 - 1.2 mg/dL   GFR calc non Af Amer >60 >60 mL/min   GFR calc Af Amer >60 >60 mL/min    Comment: (NOTE) The eGFR has been calculated using the CKD EPI equation. This calculation has not been validated in all clinical situations. eGFR's persistently <60 mL/min signify possible Chronic Kidney Disease.    Anion gap 7 5 - 15  CBC     Status: Abnormal   Collection Time: 12/28/16  1:37 AM  Result Value Ref Range   WBC 21.3 (H) 4.0 - 10.5 K/uL    Comment: REPEATED TO VERIFY   RBC 4.69 4.22 - 5.81 MIL/uL   Hemoglobin 10.5 (L) 13.0 - 17.0 g/dL   HCT 30.3 (L) 39.0 - 52.0 %   MCV 64.6 (L) 78.0 - 100.0 fL   MCH 22.4 (L) 26.0 - 34.0 pg   MCHC 34.7 30.0 - 36.0 g/dL   RDW 13.8 11.5 - 15.5 %   Platelets 97 (L) 150 - 400 K/uL    Comment: CONSISTENT WITH PREVIOUS RESULT  Protime-INR     Status: None   Collection Time: 12/28/16  1:37 AM  Result Value Ref Range   Prothrombin Time 14.2 11.4 - 15.2 seconds   INR 1.10   Save smear     Status: None   Collection Time: 12/28/16  7:27 AM  Result Value Ref Range   Smear Review SMEAR STAINED AND AVAILABLE FOR REVIEW     Dg Chest 2 View  Result Date:  12/28/2016 CLINICAL DATA:  Fever EXAM: CHEST  2 VIEW COMPARISON:  Chest x-ray of 12/27/2016 FINDINGS: The lungs are not as well aerated. However, better seen on the lateral view, there do appear to be small effusions with some fluid in the fissures. In view of cardiomegaly, these findings most likely indicate mild congestive heart failure. No bony abnormality is seen. IMPRESSION: Cardiomegaly and small effusions may indicate mild congestive heart failure. Electronically Signed   By: Ivar Drape M.D.   On: 12/28/2016 08:18   US  Abdomen Limited  Result Date: 12/27/2016 CLINICAL DATA:  Right upper quadrant pain beginning at 6 a.m. today. History of hypertension. EXAM: ULTRASOUND ABDOMEN LIMITED RIGHT UPPER QUADRANT COMPARISON:  Abdominal and pelvic CT scan of Oct 20, 2016 FINDINGS: Gallbladder: The gallbladder is adequately distended. No stones or sludge are observed. There is no gallbladder wall thickening or pericholecystic fluid. There is no positive sonographic Murphy sign. Common bile duct: Diameter: 5.3 mm Liver: The hepatic echotexture is normal. There is no focal mass nor ductal dilation. IMPRESSION: Normal limited right upper quadrant ultrasound examination. Electronically Signed   By: David  Martinique M.D.   On: 12/27/2016 12:20   Dg Chest Portable 1 View  Result Date: 12/27/2016 CLINICAL DATA:  Cough and shortness of breath EXAM: PORTABLE CHEST 1 VIEW COMPARISON:  Chest CT December 23, 2016 FINDINGS: There is no edema or consolidation. Heart is upper limits normal in size with pulmonary vascularity within normal limits. No adenopathy. No evident bone lesions. IMPRESSION: No edema or consolidation. Electronically Signed   By: Lowella Grip III M.D.   On: 12/27/2016 09:31    Review of Systems  Constitutional: Positive for chills, fever and malaise/fatigue.  HENT: Negative.   Eyes: Negative.   Respiratory: Negative.   Cardiovascular: Negative.   Gastrointestinal: Negative.   Genitourinary: Positive  for dysuria.  Musculoskeletal: Negative.   Skin: Negative.   Neurological: Negative.   Endo/Heme/Allergies: Negative.   Psychiatric/Behavioral: Negative.    Blood pressure 117/78, pulse (!) 130, temperature 98.9 F (37.2 C), temperature source Oral, resp. rate (!) 26, height 6' (1.829 m), weight 95.9 kg (211 lb 6.4 oz), SpO2 96 %. Physical Exam  Constitutional: He is oriented to person, place, and time. He appears well-developed.  Family and friend at bedside.   Eyes: Pupils are equal, round, and reactive to light.  Neck: Normal range of motion.  Cardiovascular:  Regular tachycardia HR 90-105 during exam by bedside monitor.   Respiratory: Effort normal.  GI: Soft.  Genitourinary:  Genitourinary Comments: Massive, diffusely firm prostate w/o palpable fluctuence. Foley in place with dark yellow urine.   Musculoskeletal: Normal range of motion.  Neurological: He is alert and oriented to person, place, and time.  Skin: Skin is warm.  Psychiatric: His behavior is normal.    Assessment/Plan:  1 -  Metastatic Prostate Cancer - This man will require androgen deprivation, preferrably with bilateral orchiectomy in elective setting given record of poor compliance. He is amenable.  We will discuss this and timing further at his 8/27 appt with me in office.   2 - Urinary Retention / Massive Prostate - Continue foley.  This is due to massive prostate cancer.  He may be tube dependant going forward.   3 - Fevers After Prostate Biopsy / Prostate Abscess- Agree with current IV ABX pending further CX data. He is improving in terms of fever curve. Should he deteriorate, (luekocytosis trend up, require pressors) then consider tranurethral unroofing, though this would very challenging and perhaps impossible at his gland size (300gm).   NPO p MN in case resection needed tomorrow.   Will follow.      Jerric Oyen 12/28/2016, 9:38 AM

## 2016-12-28 NOTE — Progress Notes (Signed)
Pharmacy Antibiotic Note  John Shields is a 71 y.o. male admitted on 12/27/2016 with sepsis.  Pharmacy has been consulted for vancomycin dosing.  Plan: Vancomycin 1500mg  x1 then 1250mg  IV every 12 hours.  Goal trough 15-20 mcg/mL.  Height: 6' (182.9 cm) Weight: 211 lb 6.4 oz (95.9 kg) IBW/kg (Calculated) : 77.6  Temp (24hrs), Avg:99.5 F (37.5 C), Min:98 F (36.7 C), Max:102.3 F (39.1 C)   Recent Labs Lab 12/23/16 1546 12/27/16 0849 12/27/16 0909 12/27/16 1306 12/27/16 2021 12/28/16 0137  WBC 4.9 8.9  --   --   --  21.3*  CREATININE 1.25* 1.08  --   --   --  1.09  LATICACIDVEN  --   --  2.93* 3.72* 1.6 1.7    Estimated Creatinine Clearance: 75.7 mL/min (by C-G formula based on SCr of 1.09 mg/dL).    No Known Allergies   Thank you for allowing pharmacy to be a part of this patient's care.  Wynona Neat, PharmD, BCPS  12/28/2016 7:29 AM

## 2016-12-28 NOTE — Care Management Note (Addendum)
Case Management Note  Patient Details  Name: John Shields MRN: [id number] Date of Birth: 07-12-1945  Subjective/Objective:   Pt admitted with SOB and fever                 Action/Plan:   PTA from home with chronic foley.  CM will continue to follow for discharge needs   Expected Discharge Date:                  Expected Discharge Plan:  [name]  In-House Referral:     Discharge planning Services  CM Consult  Post Acute Care Choice:    Choice offered to:     DME Arranged:    DME Agency:     HH Arranged:    HH Agency:     Status of Service:     If discussed at [employer], dates discussed:    Additional Comments: 12/28/16 CM spoke with pt.  Pt confirmed he is independent from home alone.  Pt states he has had the chronic foley for approximately 6 months.  Pt states that Dr. Arlyn Leak office with urology cares for foley and he does not require nor want HHRN.  Pt states he has a PCP at [facility] however he is not active with the service, per pt he is not service connected so uses the  [facility] in [place] as pharmacy.  Pt declined assistance with locating a PCP outside of the [place] that accepts Medicare.  [facility] informed that pt is admitted Maryclare Labrador, RN 12/28/2016, 2:02 PM

## 2016-12-28 NOTE — Progress Notes (Signed)
PROGRESS NOTE    John Shields  YBO:[id number]  DOB: [birth year]  DOA: 12/27/2016 PCP: [facility]   Brief Admission Hx: John Shields is a 71 y.o. male with medical history significant for HTN, BPH, Urinary retention with self catheterization (not changed for about 6 months), presenting with increasing shortness of breath, fever upto 102.3, diaphoresis, malaise/generalized weakness.   MDM/Assessment & Plan:   Sepsis - likely urinary source given recent prostate biopsies, I called and requested urology consult, I spoke with Dr. Tresa Moore and they suspect he has metastatic prostate cancer, He suggested getting a CT pelvis with and without contrast to evaluate for abscess, they will see him today, continue supportive care in SDU.   Thrombocytopenia - slightly improved, following.   Hyperbilirubinemia - improved with hydration  Elevated LFTs - suspect from metastatic disease, CT abd pending.   Leukocytosis - acute rise in WBC concerning, will broaden antibiotic coverage with cefepime/vanc, imaging to eval for abscess pending.   DVT prophylaxis: SCDs Code Status: full  Family Communication: none present Disposition Plan: TBD   Consultants:  urology   Subjective: Pt says he has been having cold chills since this morning.  Still not feeling well.    Objective: Vitals:   12/27/16 2245 12/28/16 0122 12/28/16 0341 12/28/16 0500  BP:  140/87 133/86   Pulse:  85 86   Resp:  19 (!) 24 19  Temp: 98.7 F (37.1 C)  98 F (36.7 C)   TempSrc: Oral  Oral   SpO2:  90% 96% 94%  Weight: 95.9 kg (211 lb 6.4 oz)     Height: 6' (1.829 m)       Intake/Output Summary (Last 24 hours) at 12/28/16 0754 Last data filed at 12/28/16 0354  Gross per 24 hour  Intake          4276.33 ml  Output             1100 ml  Net          3176.33 ml   Filed Weights   12/27/16 0844 12/27/16 2245  Weight: 90.7 kg (200 lb) 95.9 kg (211 lb 6.4 oz)     REVIEW OF SYSTEMS  As per history  otherwise all reviewed and reported negative  Exam:  General exam: awake, alert, NAD.  Respiratory system: Clear. No increased work of breathing. Cardiovascular system: S1 & S2 heard, RRR. No JVD, murmurs, gallops, clicks or pedal edema. Gastrointestinal system: Abdomen is nondistended, soft and nontender. Normal bowel sounds heard. Central nervous system: Alert and oriented. No focal neurological deficits. GU: foley in place Extremities: no CCE.  Data Reviewed: Basic Metabolic Panel:  Recent Labs Lab 12/23/16 1546 12/27/16 0849 12/28/16 0137  NA 135 135 136  K 4.0 3.5 3.8  CL 101 102 106  CO2 23 24 23   GLUCOSE 136* 126* 140*  BUN 23* 12 15  CREATININE 1.25* 1.08 1.09  CALCIUM 8.7* 8.3* 7.9*   Liver Function Tests:  Recent Labs Lab 12/23/16 1707 12/27/16 0849 12/28/16 0137  AST 32 75* 72*  ALT 24 64* 67*  ALKPHOS 129* 323* 106  BILITOT 1.2 1.9* 0.8  PROT 6.6 6.1* 5.7*  ALBUMIN 3.1* 2.5* 2.2*    Recent Labs Lab 12/23/16 1707  LIPASE 28   No results for input(s): AMMONIA in the last 168 hours. CBC:  Recent Labs Lab 12/23/16 1546 12/27/16 0849 12/28/16 0137  WBC 4.9 8.9 21.3*  NEUTROABS  --  8.0*  --   HGB 12.5*  12.9* 10.5*  HCT 36.8* 36.7* 30.3*  MCV 67.9* 64.7* 64.6*  PLT 131* 87* 97*   Cardiac Enzymes: No results for input(s): CKTOTAL, CKMB, CKMBINDEX, TROPONINI in the last 168 hours. CBG (last 3)  No results for input(s): GLUCAP in the last 72 hours. Recent Results (from the past 240 hour(s))  MRSA PCR Screening     Status: None   Collection Time: 12/28/16 12:17 AM  Result Value Ref Range Status   MRSA by PCR NEGATIVE NEGATIVE Final    Comment:        The GeneXpert MRSA Assay (FDA approved for NASAL specimens only), is one component of a comprehensive MRSA colonization surveillance program. It is not intended to diagnose MRSA infection nor to guide or monitor treatment for MRSA infections.      Studies: US Abdomen  Limited  Result Date: 12/27/2016 CLINICAL DATA:  Right upper quadrant pain beginning at 6 a.m. today. History of hypertension. EXAM: ULTRASOUND ABDOMEN LIMITED RIGHT UPPER QUADRANT COMPARISON:  Abdominal and pelvic CT scan of Oct 20, 2016 FINDINGS: Gallbladder: The gallbladder is adequately distended. No stones or sludge are observed. There is no gallbladder wall thickening or pericholecystic fluid. There is no positive sonographic Murphy sign. Common bile duct: Diameter: 5.3 mm Liver: The hepatic echotexture is normal. There is no focal mass nor ductal dilation. IMPRESSION: Normal limited right upper quadrant ultrasound examination. Electronically Signed   By: David  Martinique M.D.   On: 12/27/2016 12:20   Dg Chest Portable 1 View  Result Date: 12/27/2016 CLINICAL DATA:  Cough and shortness of breath EXAM: PORTABLE CHEST 1 VIEW COMPARISON:  Chest CT December 23, 2016 FINDINGS: There is no edema or consolidation. Heart is upper limits normal in size with pulmonary vascularity within normal limits. No adenopathy. No evident bone lesions. IMPRESSION: No edema or consolidation. Electronically Signed   By: Lowella Grip III M.D.   On: 12/27/2016 09:31     Scheduled Meds: . [START ON 12/29/2016] pneumococcal 23 valent vaccine  0.5 mL Intramuscular Tomorrow-1000   Continuous Infusions: . sodium chloride 100 mL/hr at 12/28/16 0100  . ceFEPime (MAXIPIME) IV    . vancomycin    . vancomycin      Active Problems:   Urinary retention   Essential hypertension   Hyperglycemia   BPH (benign prostatic hyperplasia)   Renal mass   Thrombocytopenia (HCC)   Sepsis [facility])   Critical Care Time spent: 40 minutes  Irwin Brakeman, MD, FAAFP [facility] Pager [phone]  If 7PM-7AM, please contact night-coverage [email or web address] Password [email or web address] 12/28/2016, 7:54 AM    LOS: 1 day

## 2016-12-29 DIAGNOSIS — N2889 Other specified disorders of kidney and ureter: Secondary | ICD-10-CM

## 2016-12-29 DIAGNOSIS — C61 Malignant neoplasm of prostate: Secondary | ICD-10-CM

## 2016-12-29 DIAGNOSIS — C799 Secondary malignant neoplasm of unspecified site: Secondary | ICD-10-CM

## 2016-12-29 LAB — COMPREHENSIVE METABOLIC PANEL
ALBUMIN: 2.1 g/dL — AB (ref 3.5–5.0)
ALK PHOS: 118 U/L (ref 38–126)
ALT: 52 U/L (ref 17–63)
ANION GAP: 8 (ref 5–15)
AST: 51 U/L — ABNORMAL HIGH (ref 15–41)
BILIRUBIN TOTAL: 1.5 mg/dL — AB (ref 0.3–1.2)
BUN: 13 mg/dL (ref 6–20)
CALCIUM: 8 mg/dL — AB (ref 8.9–10.3)
CO2: 23 mmol/L (ref 22–32)
Chloride: 105 mmol/L (ref 101–111)
Creatinine, Ser: 0.98 mg/dL (ref 0.61–1.24)
GFR calc Af Amer: >60 mL/min (ref >60)
GFR calc non Af Amer: >60 mL/min (ref >60)
GLUCOSE: 115 mg/dL — AB (ref 65–99)
Potassium: 4 mmol/L (ref 3.5–5.1)
Sodium: 136 mmol/L (ref 135–145)
TOTAL PROTEIN: 5.9 g/dL — AB (ref 6.5–8.1)

## 2016-12-29 LAB — CBC WITH DIFFERENTIAL/PLATELET
BASOS ABS: 0 10*3/uL (ref 0.0–0.1)
BASOS PCT: 0 %
EOS PCT: 1 %
Eosinophils Absolute: 0.2 10*3/uL (ref 0.0–0.7)
HEMATOCRIT: 32.7 % — AB (ref 39.0–52.0)
HEMOGLOBIN: 11.5 g/dL — AB (ref 13.0–17.0)
LYMPHS PCT: 7 %
Lymphs Abs: 1.3 10*3/uL (ref 0.7–4.0)
MCH: 22.3 pg — AB (ref 26.0–34.0)
MCHC: 35.2 g/dL (ref 30.0–36.0)
MCV: 63.5 fL — ABNORMAL LOW (ref 78.0–100.0)
MONOS PCT: 7 %
Monocytes Absolute: 1.3 10*3/uL — ABNORMAL HIGH (ref 0.1–1.0)
NEUTROS ABS: 16 10*3/uL — AB (ref 1.7–7.7)
Neutrophils Relative %: 85 %
Platelets: 95 10*3/uL — ABNORMAL LOW (ref 150–400)
RBC: 5.15 MIL/uL (ref 4.22–5.81)
RDW: 13.8 % (ref 11.5–15.5)
WBC: 18.8 10*3/uL — ABNORMAL HIGH (ref 4.0–10.5)

## 2016-12-29 LAB — BLOOD CULTURE ID PANEL (REFLEXED)
Acinetobacter baumannii: NOT DETECTED
CANDIDA GLABRATA: NOT DETECTED
Candida albicans: NOT DETECTED
Candida krusei: NOT DETECTED
Candida parapsilosis: NOT DETECTED
Candida tropicalis: NOT DETECTED
ENTEROBACTER CLOACAE COMPLEX: NOT DETECTED
Enterobacteriaceae species: NOT DETECTED
Enterococcus species: NOT DETECTED
Escherichia coli: NOT DETECTED
HAEMOPHILUS INFLUENZAE: NOT DETECTED
Klebsiella oxytoca: NOT DETECTED
Klebsiella pneumoniae: NOT DETECTED
Listeria monocytogenes: NOT DETECTED
NEISSERIA MENINGITIDIS: NOT DETECTED
PSEUDOMONAS AERUGINOSA: NOT DETECTED
Proteus species: NOT DETECTED
SERRATIA MARCESCENS: NOT DETECTED
STAPHYLOCOCCUS AUREUS BCID: NOT DETECTED
STAPHYLOCOCCUS SPECIES: NOT DETECTED
STREPTOCOCCUS AGALACTIAE: NOT DETECTED
STREPTOCOCCUS PNEUMONIAE: NOT DETECTED
STREPTOCOCCUS SPECIES: NOT DETECTED
Streptococcus pyogenes: NOT DETECTED

## 2016-12-29 LAB — URINE CULTURE

## 2016-12-29 LAB — PROCALCITONIN: Procalcitonin: 61.47 ng/mL

## 2016-12-29 MED ORDER — LISINOPRIL 10 MG PO TABS
10.0000 mg | ORAL_TABLET | Freq: Every day | ORAL | Status: DC
  Administered 2016-12-29 – 2016-12-31 (×3): 10 mg via ORAL
  Filled 2016-12-29 (×3): qty 1

## 2016-12-29 MED ORDER — HYDRALAZINE HCL 20 MG/ML IJ SOLN
10.0000 mg | Freq: Four times a day (QID) | INTRAMUSCULAR | Status: DC | PRN
  Administered 2016-12-29: 10 mg via INTRAVENOUS
  Filled 2016-12-29: qty 1

## 2016-12-29 MED ORDER — METOPROLOL SUCCINATE ER 25 MG PO TB24
12.5000 mg | ORAL_TABLET | Freq: Every day | ORAL | Status: DC
  Administered 2016-12-29 – 2016-12-30 (×2): 12.5 mg via ORAL
  Filled 2016-12-29 (×2): qty 1

## 2016-12-29 MED ORDER — TAMSULOSIN HCL 0.4 MG PO CAPS
0.4000 mg | ORAL_CAPSULE | Freq: Every day | ORAL | Status: DC
  Administered 2016-12-29 – 2017-01-02 (×5): 0.4 mg via ORAL
  Filled 2016-12-29 (×5): qty 1

## 2016-12-29 NOTE — Progress Notes (Signed)
PHARMACY - PHYSICIAN COMMUNICATION CRITICAL VALUE ALERT - BLOOD CULTURE IDENTIFICATION (BCID)  Results for orders placed or performed during the hospital encounter of 12/27/16  Blood Culture ID Panel (Reflexed) (Collected: 12/27/2016  8:49 AM)  Result Value Ref Range   Enterococcus species NOT DETECTED NOT DETECTED   Listeria monocytogenes NOT DETECTED NOT DETECTED   Staphylococcus species NOT DETECTED NOT DETECTED   Staphylococcus aureus NOT DETECTED NOT DETECTED   Streptococcus species NOT DETECTED NOT DETECTED   Streptococcus agalactiae NOT DETECTED NOT DETECTED   Streptococcus pneumoniae NOT DETECTED NOT DETECTED   Streptococcus pyogenes NOT DETECTED NOT DETECTED   Acinetobacter baumannii NOT DETECTED NOT DETECTED   Enterobacteriaceae species NOT DETECTED NOT DETECTED   Enterobacter cloacae complex NOT DETECTED NOT DETECTED   Escherichia coli NOT DETECTED NOT DETECTED   Klebsiella oxytoca NOT DETECTED NOT DETECTED   Klebsiella pneumoniae NOT DETECTED NOT DETECTED   Proteus species NOT DETECTED NOT DETECTED   Serratia marcescens NOT DETECTED NOT DETECTED   Haemophilus influenzae NOT DETECTED NOT DETECTED   Neisseria meningitidis NOT DETECTED NOT DETECTED   Pseudomonas aeruginosa NOT DETECTED NOT DETECTED   Candida albicans NOT DETECTED NOT DETECTED   Candida glabrata NOT DETECTED NOT DETECTED   Candida krusei NOT DETECTED NOT DETECTED   Candida parapsilosis NOT DETECTED NOT DETECTED   Candida tropicalis NOT DETECTED NOT DETECTED    Name of physician (or Provider) Contacted: Johnson via text page  Changes to prescribed antibiotics required: None - continue Vancomycin and Cefepime pending further culture results.  Norva Riffle 12/29/2016  11:26 AM

## 2016-12-29 NOTE — Progress Notes (Addendum)
PROGRESS NOTE    John Shields  YCX:[id number]  DOB: 1945-06-27  DOA: 12/27/2016 PCP: [facility]   Brief Admission Hx: John Shields is a 71 y.o. male with medical history significant for HTN, BPH, Urinary retention with self catheterization (not changed for about 6 months), presenting with increasing shortness of breath, fever upto 102.3, diaphoresis, malaise/generalized weakness.   MDM/Assessment & Plan:   Sepsis - from prostate infection/abscess, urology consulted, CTpelvis confirmed prostate abscess, responding to IV antibiotics, did not need to go to OR this morning per Dr. Zettie Pho note, continue current management.  Urine culture positive for enterococcus faecalis, hopefully can de-escalate antibiotics 8/4.   Metastatic prostate cancer - Pt followed by Dr. Tresa Moore for this and biopsies confirmed adenocarcinoma.   Right renal cell carcinoma - Pt followed by Dr. Tresa Moore and has appt to see him 8/27 to discuss treatment plans.  Thrombocytopenia - slightly improved, following.    Hyperbilirubinemia - improved with hydration  Elevated LFTs - suspect from metastatic disease, CT abd pending.   Leukocytosis - slowly trending down, continue cefepime/vanc  Uncontrolled Hypertension - pt says that he weaned himself off of his blood pressure medications recently, he now has rebound hypertension and tachycardia, will start BP meds and follow.    DVT prophylaxis: SCDs Code Status: full  Family Communication: none present Disposition Plan: transfer to telemetry   Consultants:  urology   Subjective: Pt having rectal pain from biopsy, no more chills.   Objective: Vitals:   12/29/16 0243 12/29/16 0307 12/29/16 0504 12/29/16 0720  BP: (!) 172/112 (!) 175/108 (!) 165/93   Pulse: (!) 118 (!) 113 (!) 42   Resp: (!) 39 (!) 29 (!) 43   Temp: 98.2 F (36.8 C)   99 F (37.2 C)  TempSrc: Oral   Oral  SpO2: 96% 98% (!) 87%   Weight:      Height:        Intake/Output  Summary (Last 24 hours) at 12/29/16 0824 Last data filed at 12/29/16 4970  Gross per 24 hour  Intake             1995 ml  Output             3625 ml  Net            -1630 ml   Filed Weights   12/27/16 0844 12/27/16 2245  Weight: 90.7 kg (200 lb) 95.9 kg (211 lb 6.4 oz)     REVIEW OF SYSTEMS  As per history otherwise all reviewed and reported negative  Exam:  General exam: awake, alert, NAD.  Respiratory system: Clear. No increased work of breathing. Cardiovascular system: S1 & S2 heard, RRR. No JVD, murmurs, gallops, clicks or pedal edema. Gastrointestinal system: Abdomen is nondistended, soft and nontender. Normal bowel sounds heard. Central nervous system: Alert and oriented. No focal neurological deficits. GU: foley in place Extremities: no CCE.  Data Reviewed: Basic Metabolic Panel:  Recent Labs Lab 12/23/16 1546 12/27/16 0849 12/28/16 0137 12/29/16 0602  NA 135 135 136 136  K 4.0 3.5 3.8 4.0  CL 101 102 106 105  CO2 23 24 23 23   GLUCOSE 136* 126* 140* 115*  BUN 23* 12 15 13   CREATININE 1.25* 1.08 1.09 0.98  CALCIUM 8.7* 8.3* 7.9* 8.0*   Liver Function Tests:  Recent Labs Lab 12/23/16 1707 12/27/16 0849 12/28/16 0137 12/29/16 0602  AST 32 75* 72* 51*  ALT 24 64* 67* 52  ALKPHOS 129* 323* 106 118  BILITOT 1.2 1.9* 0.8 1.5*  PROT 6.6 6.1* 5.7* 5.9*  ALBUMIN 3.1* 2.5* 2.2* 2.1*    Recent Labs Lab 12/23/16 1707  LIPASE 28   No results for input(s): AMMONIA in the last 168 hours. CBC:  Recent Labs Lab 12/23/16 1546 12/27/16 0849 12/28/16 0137 12/29/16 0602  WBC 4.9 8.9 21.3* 18.8*  NEUTROABS  --  8.0*  --  16.0*  HGB 12.5* 12.9* 10.5* 11.5*  HCT 36.8* 36.7* 30.3* 32.7*  MCV 67.9* 64.7* 64.6* 63.5*  PLT 131* 87* 97* 95*   Cardiac Enzymes: No results for input(s): CKTOTAL, CKMB, CKMBINDEX, TROPONINI in the last 168 hours. CBG (last 3)  No results for input(s): GLUCAP in the last 72 hours. Recent Results (from the past 240 hour(s))    Culture, blood (Routine x 2)     Status: None (Preliminary result)   Collection Time: 12/27/16  8:49 AM  Result Value Ref Range Status   Specimen Description BLOOD RIGHT ANTECUBITAL  Final   Special Requests   Final    BOTTLES DRAWN AEROBIC AND ANAEROBIC Blood Culture adequate volume   Culture NO GROWTH 1 DAY  Final   Report Status PENDING  Incomplete  Culture, blood (Routine x 2)     Status: None (Preliminary result)   Collection Time: 12/27/16  9:56 AM  Result Value Ref Range Status   Specimen Description BLOOD LEFT HAND  Final   Special Requests   Final    BOTTLES DRAWN AEROBIC AND ANAEROBIC Blood Culture adequate volume   Culture NO GROWTH 1 DAY  Final   Report Status PENDING  Incomplete  Urine culture     Status: Abnormal   Collection Time: 12/27/16 12:57 PM  Result Value Ref Range Status   Specimen Description URINE, RANDOM  Final   Special Requests NONE  Final   Culture >=100,000 COLONIES/mL ENTEROCOCCUS FAECALIS (A)  Final   Report Status 12/29/2016 FINAL  Final   Organism ID, Bacteria ENTEROCOCCUS FAECALIS (A)  Final      Susceptibility   Enterococcus faecalis - MIC*    AMPICILLIN <=2 SENSITIVE Sensitive     LEVOFLOXACIN 1 SENSITIVE Sensitive     NITROFURANTOIN <=16 SENSITIVE Sensitive     VANCOMYCIN 1 SENSITIVE Sensitive     * >=100,000 COLONIES/mL ENTEROCOCCUS FAECALIS  MRSA PCR Screening     Status: None   Collection Time: 12/28/16 12:17 AM  Result Value Ref Range Status   MRSA by PCR NEGATIVE NEGATIVE Final    Comment:        The GeneXpert MRSA Assay (FDA approved for NASAL specimens only), is one component of a comprehensive MRSA colonization surveillance program. It is not intended to diagnose MRSA infection nor to guide or monitor treatment for MRSA infections.      Studies: Ct Abdomen Pelvis W Wo Contrast  Result Date: 12/28/2016 CLINICAL DATA:  Sepsis. Evaluate for metastatic disease or abscess. Recent prostate biopsy and kidney biopsy. EXAM: CT  ABDOMEN AND PELVIS WITHOUT AND WITH CONTRAST TECHNIQUE: Multidetector CT imaging of the abdomen and pelvis was performed following the standard protocol before and following the bolus administration of intravenous contrast. CONTRAST:  162mL ISOVUE-300 IOPAMIDOL (ISOVUE-300) INJECTION 61% COMPARISON:  5/20 5/8 FINDINGS: Lower chest: Small pleural effusions identified right greater than left. Hepatobiliary: No focal liver abnormality. The gallbladder appears collapsed. No biliary dilatation. Pancreas: Normal appearance of the pancreas. Spleen: The spleen is normal. Adrenals/Urinary Tract: Normal adrenal glands. Solid enhancing lesion arising from the medial cortex of the  inferior pole of right kidney measures 3.5 cm, image 47 of series 7. Unchanged from previous exam. Simple appearing cyst arising from upper pole of right kidney measures 3.7 cm. No hydronephrosis identified. Urinary bladder is collapsed around a Foley catheter. Stomach/Bowel: The stomach appears normal. The small bowel loops have a normal course and caliber. Vascular/Lymphatic: Aortic atherosclerosis noted. No aneurysm. No enlarged upper abdominal lymph nodes. Right internal iliac lymph node measures 3.2 cm, image 73 of series 7. Previously 6.7 cm. Right anterior pelvis lymph node measures 1.1 cm, image 76 of series 7. Previously 1.3 cm. 8 mm left posterior pelvic lymph node is identified, image 78 of series 7. Previously 1 cm. Reproductive: Enlargement of the prostate gland is again noted. There is a new low-attenuation fluid collection associated with the posterior gland which measures 5.1 x 3.9 x 4.2 cm, image 64 of series 10. Other: No free fluid identified. Musculoskeletal: Stable appearance of scattered sclerotic foci including the 2.4 cm lesion in the right inferior pubic rami. IMPRESSION: 1. Interval development of new low-attenuation fluid collection involving the posterior aspect of the prostate gland. In the setting of recent biopsy and  sepsis findings may represent abscess. Clinical correlation advised. 2. Similar appearance of solid enhancing lesion involving the inferior pole of the right kidney compatible with a renal cell carcinoma. 3. Stable pelvic adenopathy. 4. Multifocal sclerotic foci involving the visualized portions of the axial skeleton. Suspicious for bone metastases. These results will be called to the ordering clinician or representative by the Radiologist Assistant, and communication documented in the PACS or zVision Dashboard. Electronically Signed   By: Kerby Moors M.D.   On: 12/28/2016 14:58   Dg Chest 2 View  Result Date: 12/28/2016 CLINICAL DATA:  Fever EXAM: CHEST  2 VIEW COMPARISON:  Chest x-ray of 12/27/2016 FINDINGS: The lungs are not as well aerated. However, better seen on the lateral view, there do appear to be small effusions with some fluid in the fissures. In view of cardiomegaly, these findings most likely indicate mild congestive heart failure. No bony abnormality is seen. IMPRESSION: Cardiomegaly and small effusions may indicate mild congestive heart failure. Electronically Signed   By: Ivar Drape M.D.   On: 12/28/2016 08:18   US Abdomen Limited  Result Date: 12/27/2016 CLINICAL DATA:  Right upper quadrant pain beginning at 6 a.m. today. History of hypertension. EXAM: ULTRASOUND ABDOMEN LIMITED RIGHT UPPER QUADRANT COMPARISON:  Abdominal and pelvic CT scan of Oct 20, 2016 FINDINGS: Gallbladder: The gallbladder is adequately distended. No stones or sludge are observed. There is no gallbladder wall thickening or pericholecystic fluid. There is no positive sonographic Murphy sign. Common bile duct: Diameter: 5.3 mm Liver: The hepatic echotexture is normal. There is no focal mass nor ductal dilation. IMPRESSION: Normal limited right upper quadrant ultrasound examination. Electronically Signed   By: David  Martinique M.D.   On: 12/27/2016 12:20   Dg Chest Portable 1 View  Result Date: 12/27/2016 CLINICAL DATA:   Cough and shortness of breath EXAM: PORTABLE CHEST 1 VIEW COMPARISON:  Chest CT December 23, 2016 FINDINGS: There is no edema or consolidation. Heart is upper limits normal in size with pulmonary vascularity within normal limits. No adenopathy. No evident bone lesions. IMPRESSION: No edema or consolidation. Electronically Signed   By: Lowella Grip III M.D.   On: 12/27/2016 09:31     Scheduled Meds: . pneumococcal 23 valent vaccine  0.5 mL Intramuscular Tomorrow-1000   Continuous Infusions: . sodium chloride 50 mL/hr at 12/28/16  1015  . ceFEPime (MAXIPIME) IV Stopped (12/29/16 2429)  . vancomycin Stopped (12/28/16 2125)    Active Problems:   Urinary retention   Essential hypertension   Hyperglycemia   BPH (benign prostatic hyperplasia)   Renal mass   Thrombocytopenia (HCC)   Sepsis [facility]   Critical Care Time spent: 32 minutes  Irwin Brakeman, MD, FAAFP [facility] Pager [phone]  If 7PM-7AM, please contact night-coverage [email or web address] Password [email or web address] 12/29/2016, 8:24 AM    LOS: 2 days

## 2016-12-29 NOTE — Evaluation (Signed)
Physical Therapy Evaluation Patient Details Name: John Shields MRN: [id number] DOB: December 28, 1945 Today's Date: 12/29/2016   History of Present Illness  71 yo with history of HTN, BPH, Urinary retention with self catheterization, metastatic prostate CA. Admitted with urinary retention, prostate abscess and sepsis  Clinical Impression  Pt agreeable to mobilize with PT and A&O x4. Pt able to ambulate in hall with RW; however requires physical assist to maintain stability. At end of session, pt attempted to ambulate without RW and PT had to assist to keep from [place]. Pt states he was independent with all ADLs PTA without use of AD. Pt educated on recommendation for using RW for safe ambulation. Feel home will be appropriate discharge plan as pt has no stairs to enter and will be able to ambulate home safely using RW. Pt presents with deficits listed below and will continue to benefit from acute therapy for mobilization and strengthening for safe discharge.     Follow Up Recommendations Home health PT    Equipment Recommendations  Rolling walker with 5" wheels    Recommendations for Other Services       Precautions / Restrictions Precautions Precautions: Fall Restrictions Weight Bearing Restrictions: No      Mobility  Bed Mobility Overal bed mobility: Needs Assistance Bed Mobility: Supine to Sit     Supine to sit: Min guard     General bed mobility comments: Min guard for safety. Pt required VCs for sequencing and needed to use bed rail to rise. Needed increased time and HOB elevated 35 degrees  Transfers Overall transfer level: Needs assistance   Transfers: Sit to/from Stand Sit to Stand: Min guard         General transfer comment: Min guard for safety and VCs for hand placement   Ambulation/Gait Ambulation/Gait assistance: Min guard Ambulation Distance (Feet): 350 Feet Assistive device: Rolling walker (2 wheeled) Gait Pattern/deviations: Shuffle;Decreased stride length     General Gait Details: Min guard for safety. VCs to decrease shuffling of gait   Stairs            Wheelchair Mobility    Modified Rankin (Stroke Patients Only)       Balance Overall balance assessment: Needs assistance Sitting-balance support: Bilateral upper extremity supported;Feet unsupported Sitting balance-Leahy Scale: Fair     Standing balance support: Bilateral upper extremity supported;During functional activity Standing balance-Leahy Scale: Poor Standing balance comment: Reliant on RW for support during ambulation                              Pertinent Vitals/Pain Pain Assessment: 0-10 Pain Score: 10-Worst pain ever Pain Location: sacrum  Pain Descriptors / Indicators: Discomfort Pain Intervention(s): Limited activity within patient's tolerance;Monitored during session;Repositioned    Home Living Family/patient expects to be discharged to:: Private residence Living Arrangements: Alone   Type of Home: House Home Access: Level entry     Home Layout: One level Home Equipment: None      Prior Function Level of Independence: Independent         Comments: 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 [employer]        Extremity/Trunk Assessment   Upper Extremity Assessment Upper Extremity Assessment: Overall WFL for tasks assessed    Lower Extremity Assessment Lower Extremity Assessment: Generalized weakness    Cervical / Trunk Assessment Cervical / Trunk Assessment: Kyphotic  Communication      Cognition Arousal/Alertness: Awake/alert Behavior  During Therapy: WFL for tasks assessed/performed Overall Cognitive Status: Within Functional Limits for tasks assessed                                        General Comments      Exercises     Assessment/Plan    PT Assessment Patient needs continued PT services  PT Problem List Decreased strength;Decreased balance;Decreased mobility;Pain       PT  Treatment Interventions DME instruction;Gait training;Functional mobility training;Therapeutic exercise;Therapeutic activities;Balance training;Patient/family education    PT Goals (Current goals can be found in the Care Plan section)  Acute Rehab PT Goals Patient Stated Goal: go home  PT Goal Formulation: With patient Time For Goal Achievement: 01/12/17 Potential to Achieve Goals: Good    Frequency Min 3X/week   Barriers to discharge        Co-evaluation               AM-PAC PT "6 Clicks" Daily Activity  Outcome Measure Difficulty turning over in bed (including adjusting bedclothes, sheets and blankets)?: None Difficulty moving from lying on back to sitting on the side of the bed? : A Little Difficulty sitting down on and standing up from a chair with arms (e.g., wheelchair, bedside commode, etc,.)?: A Little Help needed moving to and from a bed to chair (including a wheelchair)?: A Little Help needed walking in hospital room?: A Little Help needed climbing 3-5 steps with a railing? : A Little 6 Click Score: 19    End of Session Equipment Utilized During Treatment: Gait belt Activity Tolerance: Patient tolerated treatment well Patient left: Other (comment) (Pt immediately transported via transport chair to new room on5W) Nurse Communication: Mobility status PT Visit Diagnosis: Unsteadiness on feet (R26.81);Other abnormalities of gait and mobility (R26.89);Difficulty in walking, not elsewhere classified (R26.2);Pain;Muscle weakness (generalized) (M62.81) Pain - part of body:  (sacrum )    Time: 1884-1660 PT Time Calculation (min) (ACUTE ONLY): 24 min   Charges:   PT Evaluation $PT Eval Moderate Complexity: 1 Mod     PT G CodesElberta Leatherwood, SPT Acute Rehab [place] 12/29/2016, 1:35 PM

## 2016-12-29 NOTE — Progress Notes (Signed)
Subjective/Chief Complaint:   1 -  Metastatic Prostate Cancer - PSA 400 2018. Pelvic node biopsy 10/2016 inconclusive. TRUS BX 11/2016 333 mL with 12/12 cores upt to 90% Gleason 8 adenocarcinoma. Alk PHos and Cr normal 2018.   2 - Urinary Retention / Massive Prostate - urinary retention, catheter dependant since 2018. Prostate Vol 343m by CT ellipsoid calculation 2018. Likely metastatic prostate cancer in massive prostate as per above.   3 - Fevers After Prostate Biopsy / Likely Prostate Abscess - fevers, leukocytosis, tachycardia 12/2016 after prostate biopsy 12/19/16. [place]. Placed on empirc Vanc + Cefepime. He received Bactrim / GNorva Karvonenat time of biopsy. No prior positive UCX's for review. CT 8/2 with liklely mid-apical gland abscess.   Today "LTevan is stable. Wants to eat. Fever curve already trending down. Stable tachycardia.   Objective: Vital signs in last 24 hours: Temp:  [98.2 F (36.8 C)-98.9 F (37.2 C)] 98.2 F (36.8 C) (08/03 0243) Pulse Rate:  [42-130] 42 (08/03 0504) Resp:  [14-43] 43 (08/03 0504) BP: (117-175)/(76-112) 165/93 (08/03 0504) SpO2:  [87 %-100 %] 87 % (08/03 0504) Last BM Date: 12/27/16  Intake/Output from previous day: 08/02 0701 - 08/03 0700 In: 12119[P.O.:1035; I.V.:250; IV Piggyback:300] Out: 2450 [Urine:2450] Intake/Output this shift: Total I/O In: 1390 [P.O.:840; I.V.:250; IV Piggyback:300] Out: 1050 [Urine:1050]  General appearance: alert, cooperative, appears stated age and at baseline Eyes: negative Nose: Nares normal. Septum midline. Mucosa normal. No drainage or sinus tenderness. Throat: lips, mucosa, and tongue normal; teeth and gums normal Neck: supple, symmetrical, trachea midline Back: symmetric, no curvature. ROM normal. No CVA tenderness. Resp: non-labored on minimal [place] O2 Cardio: regular tachycardia be bydside monitor Male genitalia: normal, foley c/d/i wiht medium yellow urine.  Extremities: extremities  normal, atraumatic, no cyanosis or edema Pulses: 2+ and symmetric Skin: Skin color, texture, turgor normal. No rashes or lesions Neurologic: Grossly normal  Lab Results:   Recent Labs  12/27/16 0849 12/28/16 0137  WBC 8.9 21.3*  HGB 12.9* 10.5*  HCT 36.7* 30.3*  PLT 87* 97*   BMET  Recent Labs  12/27/16 0849 12/28/16 0137  NA 135 136  K 3.5 3.8  CL 102 106  CO2 24 23  GLUCOSE 126* 140*  BUN 12 15  CREATININE 1.08 1.09  CALCIUM 8.3* 7.9*   PT/INR  Recent Labs  12/27/16 0849 12/28/16 0137  LABPROT 13.8 14.2  INR 1.06 1.10   ABG No results for input(s): PHART, HCO3 in the last 72 hours.  Invalid input(s): PCO2, PO2  Studies/Results: Ct Abdomen Pelvis W Wo Contrast  Result Date: 12/28/2016 CLINICAL DATA:  Sepsis. Evaluate for metastatic disease or abscess. Recent prostate biopsy and kidney biopsy. EXAM: CT ABDOMEN AND PELVIS WITHOUT AND WITH CONTRAST TECHNIQUE: Multidetector CT imaging of the abdomen and pelvis was performed following the standard protocol before and following the bolus administration of intravenous contrast. CONTRAST:  1026mISOVUE-300 IOPAMIDOL (ISOVUE-300) INJECTION 61% COMPARISON:  5/20 5/8 FINDINGS: Lower chest: Small pleural effusions identified right greater than left. Hepatobiliary: No focal liver abnormality. The gallbladder appears collapsed. No biliary dilatation. Pancreas: Normal appearance of the pancreas. Spleen: The spleen is normal. Adrenals/Urinary Tract: Normal adrenal glands. Solid enhancing lesion arising from the medial cortex of the inferior pole of right kidney measures 3.5 cm, image 47 of series 7. Unchanged from previous exam. Simple appearing cyst arising from upper pole of right kidney measures 3.7 cm. No hydronephrosis identified. Urinary bladder is collapsed around a Foley catheter.  Stomach/Bowel: The stomach appears normal. The small bowel loops have a normal course and caliber. Vascular/Lymphatic: Aortic atherosclerosis noted.  No aneurysm. No enlarged upper abdominal lymph nodes. Right internal iliac lymph node measures 3.2 cm, image 73 of series 7. Previously 6.7 cm. Right anterior pelvis lymph node measures 1.1 cm, image 76 of series 7. Previously 1.3 cm. 8 mm left posterior pelvic lymph node is identified, image 78 of series 7. Previously 1 cm. Reproductive: Enlargement of the prostate gland is again noted. There is a new low-attenuation fluid collection associated with the posterior gland which measures 5.1 x 3.9 x 4.2 cm, image 64 of series 10. Other: No free fluid identified. Musculoskeletal: Stable appearance of scattered sclerotic foci including the 2.4 cm lesion in the right inferior pubic rami. IMPRESSION: 1. Interval development of new low-attenuation fluid collection involving the posterior aspect of the prostate gland. In the setting of recent biopsy and sepsis findings may represent abscess. Clinical correlation advised. 2. Similar appearance of solid enhancing lesion involving the inferior pole of the right kidney compatible with a renal cell carcinoma. 3. Stable pelvic adenopathy. 4. Multifocal sclerotic foci involving the visualized portions of the axial skeleton. Suspicious for bone metastases. These results will be called to the ordering clinician or representative by the Radiologist Assistant, and communication documented in the PACS or zVision Dashboard. Electronically Signed   By: Kerby Moors M.D.   On: 12/28/2016 14:58   Dg Chest 2 View  Result Date: 12/28/2016 CLINICAL DATA:  Fever EXAM: CHEST  2 VIEW COMPARISON:  Chest x-ray of 12/27/2016 FINDINGS: The lungs are not as well aerated. However, better seen on the lateral view, there do appear to be small effusions with some fluid in the fissures. In view of cardiomegaly, these findings most likely indicate mild congestive heart failure. No bony abnormality is seen. IMPRESSION: Cardiomegaly and small effusions may indicate mild congestive heart failure.  Electronically Signed   By: Ivar Drape M.D.   On: 12/28/2016 08:18   US Abdomen Limited  Result Date: 12/27/2016 CLINICAL DATA:  Right upper quadrant pain beginning at 6 a.m. today. History of hypertension. EXAM: ULTRASOUND ABDOMEN LIMITED RIGHT UPPER QUADRANT COMPARISON:  Abdominal and pelvic CT scan of Oct 20, 2016 FINDINGS: Gallbladder: The gallbladder is adequately distended. No stones or sludge are observed. There is no gallbladder wall thickening or pericholecystic fluid. There is no positive sonographic Murphy sign. Common bile duct: Diameter: 5.3 mm Liver: The hepatic echotexture is normal. There is no focal mass nor ductal dilation. IMPRESSION: Normal limited right upper quadrant ultrasound examination. Electronically Signed   By: David  Martinique M.D.   On: 12/27/2016 12:20   Dg Chest Portable 1 View  Result Date: 12/27/2016 CLINICAL DATA:  Cough and shortness of breath EXAM: PORTABLE CHEST 1 VIEW COMPARISON:  Chest CT December 23, 2016 FINDINGS: There is no edema or consolidation. Heart is upper limits normal in size with pulmonary vascularity within normal limits. No adenopathy. No evident bone lesions. IMPRESSION: No edema or consolidation. Electronically Signed   By: Lowella Grip III M.D.   On: 12/27/2016 09:31    Anti-infectives: Anti-infectives    Start     Dose/Rate Route Frequency Ordered Stop   12/28/16 2000  vancomycin (VANCOCIN) 1,250 mg in sodium chloride 0.9 % 250 mL IVPB     1,250 mg 166.7 mL/hr over 90 Minutes Intravenous Every 12 hours 12/28/16 0731     12/28/16 1000  cefTRIAXone (ROCEPHIN) 1 g in dextrose 5 % 50  mL IVPB  Status:  Discontinued     1 g 100 mL/hr over 30 Minutes Intravenous Every 24 hours 12/27/16 0926 12/28/16 0722   12/28/16 0815  ceFEPIme (MAXIPIME) 1 g in dextrose 5 % 50 mL IVPB     1 g 100 mL/hr over 30 Minutes Intravenous Every 8 hours 12/28/16 0722     12/28/16 0800  vancomycin (VANCOCIN) 1,500 mg in sodium chloride 0.9 % 500 mL IVPB     1,500  mg 250 mL/hr over 120 Minutes Intravenous  Once 12/28/16 0731 12/28/16 1111   12/27/16 0930  cefTRIAXone (ROCEPHIN) 2 g in dextrose 5 % 50 mL IVPB     2 g 100 mL/hr over 30 Minutes Intravenous  Once 12/27/16 8786 12/27/16 1239      Assessment/Plan:  1 -  Metastatic Prostate Cancer - This man will require androgen deprivation, preferrably with bilateral orchiectomy in elective setting given record of poor compliance. He is amenable.  We will discuss this and timing further at his 8/27 appt with me in office.   2 - Urinary Retention / Massive Prostate - Continue foley.  This is due to massive prostate cancer.  He may be tube dependant going forward.   3 - Fevers After Prostate Biopsy / Prostate Abscess- Agree with current IV ABX pending further CX data. He is improving in terms of fever curve. Should he deteriorate, (luekocytosis trend up, require pressors) then consider tranurethral unroofing, though this would very challenging and perhaps impossible at his gland size (300gm).   Reg diet ordered as he is improving clinically and I do not feel will require surgery today.    Will follow.    [facility], Cherese Lozano 12/29/2016

## 2016-12-29 NOTE — Consult Note (Signed)
[facility] CM Primary Care Navigator  12/29/2016  John Shields 05-Oct-1945 [id number]   Met with patient at the bedside to identify possibledischarge needs.  Patient reports having sudden vomiting and "unable to pass out urine" that had led to this admission. Patient endorses Dr. Carolan Clines with [facility] as hisprimary provider that he had been seeing since he does not have a primary care provider and "not planning to have one" as stated.He mentioned that Dr. Gaynelle Arabian retired and will be seeing whoever is replacing him or any doctor in their group. Patient refused the list of [facility] primary care providers offered to him.   [facility] to obtain [facility] without any problem.  [place] managinghismedications at home straight out of the containers.  Patient was driving prior to admission but his friend Sunday Spillers) and his neighbor Estill Bamberg) will be providingtransportation to hisdoctors' appointments after discharge per patient.   Patient's friend and his neighbor will also be his primary caregivers at home according to patient.  Anticipated discharge plan ishome per patient.  Patient voiced understanding to call primary provider's office ([facility]) when he returnshome, for a post discharge follow-up appointment within a week or sooner if needs arise.Patient letter (with PCP's contact number) wasprovided as hisreminder.  Explained to patient regarding[facility] CM services available for healthmanagement but he denies any current concerns or needs at this time. However, heopted and verbally agreed to [place] to followup with hisrecovery at home.  Referral to [place] calls made.  [facility] care management information provided for any future needs that he may have.   For questions, please contact:  Dannielle Huh, BSN, RN- [facility] Primary Care Navigator  Telephone: [phone] Miguel Barrera

## 2016-12-30 DIAGNOSIS — R338 Other retention of urine: Secondary | ICD-10-CM

## 2016-12-30 DIAGNOSIS — N401 Enlarged prostate with lower urinary tract symptoms: Secondary | ICD-10-CM

## 2016-12-30 LAB — CBC WITH DIFFERENTIAL/PLATELET
BASOS PCT: 0 %
Basophils Absolute: 0 10*3/uL (ref 0.0–0.1)
EOS ABS: 0 10*3/uL (ref 0.0–0.7)
EOS PCT: 0 %
HCT: 33.4 % — ABNORMAL LOW (ref 39.0–52.0)
Hemoglobin: 12 g/dL — ABNORMAL LOW (ref 13.0–17.0)
LYMPHS PCT: 13 %
Lymphs Abs: 2.6 10*3/uL (ref 0.7–4.0)
MCH: 22.8 pg — ABNORMAL LOW (ref 26.0–34.0)
MCHC: 35.9 g/dL (ref 30.0–36.0)
MCV: 63.4 fL — AB (ref 78.0–100.0)
MONO ABS: 1.6 10*3/uL — AB (ref 0.1–1.0)
Monocytes Relative: 8 %
NEUTROS PCT: 79 %
Neutro Abs: 15.9 10*3/uL — ABNORMAL HIGH (ref 1.7–7.7)
PLATELETS: 117 10*3/uL — AB (ref 150–400)
RBC: 5.27 MIL/uL (ref 4.22–5.81)
RDW: 14.1 % (ref 11.5–15.5)
WBC: 20.1 10*3/uL — AB (ref 4.0–10.5)

## 2016-12-30 LAB — COMPREHENSIVE METABOLIC PANEL
ALBUMIN: 2.2 g/dL — AB (ref 3.5–5.0)
ALT: 44 U/L (ref 17–63)
AST: 38 U/L (ref 15–41)
Alkaline Phosphatase: 106 U/L (ref 38–126)
Anion gap: 5 (ref 5–15)
BUN: 10 mg/dL (ref 6–20)
CHLORIDE: 102 mmol/L (ref 101–111)
CO2: 27 mmol/L (ref 22–32)
CREATININE: 0.94 mg/dL (ref 0.61–1.24)
Calcium: 8 mg/dL — ABNORMAL LOW (ref 8.9–10.3)
GFR calc Af Amer: >60 mL/min (ref >60)
GLUCOSE: 137 mg/dL — AB (ref 65–99)
POTASSIUM: 3.8 mmol/L (ref 3.5–5.1)
SODIUM: 134 mmol/L — AB (ref 135–145)
Total Bilirubin: 1.3 mg/dL — ABNORMAL HIGH (ref 0.3–1.2)
Total Protein: 6.3 g/dL — ABNORMAL LOW (ref 6.5–8.1)

## 2016-12-30 MED ORDER — AMLODIPINE BESYLATE 5 MG PO TABS
5.0000 mg | ORAL_TABLET | Freq: Every day | ORAL | Status: DC
  Administered 2016-12-30 – 2017-01-02 (×4): 5 mg via ORAL
  Filled 2016-12-30 (×4): qty 1

## 2016-12-30 MED ORDER — SENNOSIDES-DOCUSATE SODIUM 8.6-50 MG PO TABS
1.0000 | ORAL_TABLET | Freq: Two times a day (BID) | ORAL | Status: DC
  Administered 2016-12-30 – 2017-01-02 (×6): 1 via ORAL
  Filled 2016-12-30 (×6): qty 1

## 2016-12-30 MED ORDER — ZOLPIDEM TARTRATE 5 MG PO TABS
5.0000 mg | ORAL_TABLET | Freq: Every evening | ORAL | Status: DC | PRN
  Administered 2016-12-30 – 2017-01-02 (×2): 5 mg via ORAL
  Filled 2016-12-30 (×2): qty 1

## 2016-12-30 MED ORDER — LEVOFLOXACIN IN D5W 750 MG/150ML IV SOLN
750.0000 mg | INTRAVENOUS | Status: DC
  Administered 2016-12-30: 750 mg via INTRAVENOUS
  Filled 2016-12-30 (×2): qty 150

## 2016-12-30 MED ORDER — POLYETHYLENE GLYCOL 3350 17 G PO PACK
17.0000 g | PACK | Freq: Two times a day (BID) | ORAL | Status: DC
  Administered 2016-12-30 – 2017-01-02 (×6): 17 g via ORAL
  Filled 2016-12-30 (×6): qty 1

## 2016-12-30 NOTE — Progress Notes (Signed)
PROGRESS NOTE    John Shields  QJJ:[id number]  DOB: 03-Apr-1946  DOA: 12/27/2016 PCP: [facility]   Brief Admission Hx: John Shields is a 71 y.o. male with medical history significant for HTN, BPH, Urinary retention with self catheterization (not changed for about 6 months), presenting with increasing shortness of breath, fever upto 102.3, diaphoresis, malaise/generalized weakness.   MDM/Assessment & Plan:   Sepsis - from prostate infection/abscess, urology consulted, CTpelvis confirmed prostate abscess, responding to IV antibiotics, did not need to go to OR this morning per Dr. Zettie Pho note, continue current management.  Urine culture positive for enterococcus faecalis, hopefully can de-escalate antibiotics 8/4.  Pt still having chills.  Will switch to IV levofloxacin given culture findings and urology recommendations.  Not thought to need surgery at this time per urology.   Metastatic prostate cancer - Pt followed by Dr. Tresa Moore for this and biopsies confirmed adenocarcinoma.   Right renal cell carcinoma - Pt followed by Dr. Tresa Moore and has appt to see him 8/27 to discuss treatment plans.  Thrombocytopenia - slightly improved, following.    Hyperbilirubinemia - improved with hydration  Elevated LFTs - Improved   Leukocytosis - slowly trending down, Levofloxacin IV  Uncontrolled Hypertension - pt says that he weaned himself off of his blood pressure medications recently, he now has rebound hypertension and tachycardia, will start BP meds and follow.  Titrate BP meds further today, see orders.   DVT prophylaxis: SCDs Code Status: full  Family Communication: none present Disposition Plan: transfer to telemetry   Consultants:  urology   Subjective: Pt having more chills now.    Objective: Vitals:   12/29/16 1233 12/29/16 1308 12/29/16 2135 12/30/16 0510  BP: (!) 154/87 (!) 167/106 (!) 154/94 (!) 164/94  Pulse: 98 96 91 90  Resp: 20  18 18   Temp: 98 F (36.7  C)  98 F (36.7 C) 98.1 F (36.7 C)  TempSrc: Oral  Oral Oral  SpO2: 97% 97% 95% 96%  Weight:      Height:        Intake/Output Summary (Last 24 hours) at 12/30/16 1033 Last data filed at 12/30/16 0935  Gross per 24 hour  Intake              480 ml  Output             4650 ml  Net            -4170 ml   Filed Weights   12/27/16 0844 12/27/16 2245  Weight: 90.7 kg (200 lb) 95.9 kg (211 lb 6.4 oz)     REVIEW OF SYSTEMS  As per history otherwise all reviewed and reported negative  Exam:  General exam: awake, alert, NAD.  Respiratory system: Clear. No increased work of breathing. Cardiovascular system: S1 & S2 heard, RRR. No JVD, murmurs, gallops, clicks or pedal edema. Gastrointestinal system: Abdomen is nondistended, soft and nontender. Normal bowel sounds heard. Central nervous system: Alert and oriented. No focal neurological deficits. GU: foley in place Extremities: no CCE.  Data Reviewed: Basic Metabolic Panel:  Recent Labs Lab 12/23/16 1546 12/27/16 0849 12/28/16 0137 12/29/16 0602 12/30/16 0452  NA 135 135 136 136 134*  K 4.0 3.5 3.8 4.0 3.8  CL 101 102 106 105 102  CO2 23 24 23 23 27   GLUCOSE 136* 126* 140* 115* 137*  BUN 23* 12 15 13 10   CREATININE 1.25* 1.08 1.09 0.98 0.94  CALCIUM 8.7* 8.3* 7.9* 8.0* 8.0*  Liver Function Tests:  Recent Labs Lab 12/23/16 1707 12/27/16 0849 12/28/16 0137 12/29/16 0602 12/30/16 0452  AST 32 75* 72* 51* 38  ALT 24 64* 67* 52 44  ALKPHOS 129* 323* 106 118 106  BILITOT 1.2 1.9* 0.8 1.5* 1.3*  PROT 6.6 6.1* 5.7* 5.9* 6.3*  ALBUMIN 3.1* 2.5* 2.2* 2.1* 2.2*    Recent Labs Lab 12/23/16 1707  LIPASE 28   No results for input(s): AMMONIA in the last 168 hours. CBC:  Recent Labs Lab 12/23/16 1546 12/27/16 0849 12/28/16 0137 12/29/16 0602 12/30/16 0452  WBC 4.9 8.9 21.3* 18.8* 20.1*  NEUTROABS  --  8.0*  --  16.0* 15.9*  HGB 12.5* 12.9* 10.5* 11.5* 12.0*  HCT 36.8* 36.7* 30.3* 32.7* 33.4*  MCV  67.9* 64.7* 64.6* 63.5* 63.4*  PLT 131* 87* 97* 95* 117*   Cardiac Enzymes: No results for input(s): CKTOTAL, CKMB, CKMBINDEX, TROPONINI in the last 168 hours. CBG (last 3)  No results for input(s): GLUCAP in the last 72 hours. Recent Results (from the past 240 hour(s))  Culture, blood (Routine x 2)     Status: None (Preliminary result)   Collection Time: 12/27/16  8:49 AM  Result Value Ref Range Status   Specimen Description BLOOD RIGHT ANTECUBITAL  Final   Special Requests   Final    BOTTLES DRAWN AEROBIC AND ANAEROBIC Blood Culture adequate volume   Culture  Setup Time   Final    GRAM POSITIVE COCCI GRAM NEGATIVE RODS ANAEROBIC BOTTLE ONLY CRITICAL RESULT CALLED TO, READ BACK BY AND VERIFIED WITH: Leonie Green PHARMD, AT 1111 12/29/16 BY D. VANHOOK    Culture   Final    GRAM POSITIVE COCCI GRAM NEGATIVE RODS CULTURE REINCUBATED FOR BETTER GROWTH    Report Status PENDING  Incomplete  Blood Culture ID Panel (Reflexed)     Status: None   Collection Time: 12/27/16  8:49 AM  Result Value Ref Range Status   Enterococcus species NOT DETECTED NOT DETECTED Final   Listeria monocytogenes NOT DETECTED NOT DETECTED Final   Staphylococcus species NOT DETECTED NOT DETECTED Final   Staphylococcus aureus NOT DETECTED NOT DETECTED Final   Streptococcus species NOT DETECTED NOT DETECTED Final   Streptococcus agalactiae NOT DETECTED NOT DETECTED Final   Streptococcus pneumoniae NOT DETECTED NOT DETECTED Final   Streptococcus pyogenes NOT DETECTED NOT DETECTED Final   Acinetobacter baumannii NOT DETECTED NOT DETECTED Final   Enterobacteriaceae species NOT DETECTED NOT DETECTED Final   Enterobacter cloacae complex NOT DETECTED NOT DETECTED Final   Escherichia coli NOT DETECTED NOT DETECTED Final   Klebsiella oxytoca NOT DETECTED NOT DETECTED Final   Klebsiella pneumoniae NOT DETECTED NOT DETECTED Final   Proteus species NOT DETECTED NOT DETECTED Final   Serratia marcescens NOT DETECTED NOT  DETECTED Final   Haemophilus influenzae NOT DETECTED NOT DETECTED Final   Neisseria meningitidis NOT DETECTED NOT DETECTED Final   Pseudomonas aeruginosa NOT DETECTED NOT DETECTED Final   Candida albicans NOT DETECTED NOT DETECTED Final   Candida glabrata NOT DETECTED NOT DETECTED Final   Candida krusei NOT DETECTED NOT DETECTED Final   Candida parapsilosis NOT DETECTED NOT DETECTED Final   Candida tropicalis NOT DETECTED NOT DETECTED Final  Culture, blood (Routine x 2)     Status: None (Preliminary result)   Collection Time: 12/27/16  9:56 AM  Result Value Ref Range Status   Specimen Description BLOOD LEFT HAND  Final   Special Requests   Final    BOTTLES DRAWN  AEROBIC AND ANAEROBIC Blood Culture adequate volume   Culture  Setup Time   Final    GRAM NEGATIVE RODS ANAEROBIC BOTTLE ONLY CRITICAL VALUE NOTED.  VALUE IS CONSISTENT WITH PREVIOUSLY REPORTED AND CALLED VALUE.    Culture   Final    GRAM NEGATIVE RODS CULTURE REINCUBATED FOR BETTER GROWTH    Report Status PENDING  Incomplete  Urine culture     Status: Abnormal   Collection Time: 12/27/16 12:57 PM  Result Value Ref Range Status   Specimen Description URINE, RANDOM  Final   Special Requests NONE  Final   Culture >=100,000 COLONIES/mL ENTEROCOCCUS FAECALIS (A)  Final   Report Status 12/29/2016 FINAL  Final   Organism ID, Bacteria ENTEROCOCCUS FAECALIS (A)  Final      Susceptibility   Enterococcus faecalis - MIC*    AMPICILLIN <=2 SENSITIVE Sensitive     LEVOFLOXACIN 1 SENSITIVE Sensitive     NITROFURANTOIN <=16 SENSITIVE Sensitive     VANCOMYCIN 1 SENSITIVE Sensitive     * >=100,000 COLONIES/mL ENTEROCOCCUS FAECALIS  MRSA PCR Screening     Status: None   Collection Time: 12/28/16 12:17 AM  Result Value Ref Range Status   MRSA by PCR NEGATIVE NEGATIVE Final    Comment:        The GeneXpert MRSA Assay (FDA approved for NASAL specimens only), is one component of a comprehensive MRSA colonization surveillance  program. It is not intended to diagnose MRSA infection nor to guide or monitor treatment for MRSA infections.      Studies: Ct Abdomen Pelvis W Wo Contrast  Result Date: 12/28/2016 CLINICAL DATA:  Sepsis. Evaluate for metastatic disease or abscess. Recent prostate biopsy and kidney biopsy. EXAM: CT ABDOMEN AND PELVIS WITHOUT AND WITH CONTRAST TECHNIQUE: Multidetector CT imaging of the abdomen and pelvis was performed following the standard protocol before and following the bolus administration of intravenous contrast. CONTRAST:  144mL ISOVUE-300 IOPAMIDOL (ISOVUE-300) INJECTION 61% COMPARISON:  5/20 5/8 FINDINGS: Lower chest: Small pleural effusions identified right greater than left. Hepatobiliary: No focal liver abnormality. The gallbladder appears collapsed. No biliary dilatation. Pancreas: Normal appearance of the pancreas. Spleen: The spleen is normal. Adrenals/Urinary Tract: Normal adrenal glands. Solid enhancing lesion arising from the medial cortex of the inferior pole of right kidney measures 3.5 cm, image 47 of series 7. Unchanged from previous exam. Simple appearing cyst arising from upper pole of right kidney measures 3.7 cm. No hydronephrosis identified. Urinary bladder is collapsed around a Foley catheter. Stomach/Bowel: The stomach appears normal. The small bowel loops have a normal course and caliber. Vascular/Lymphatic: Aortic atherosclerosis noted. No aneurysm. No enlarged upper abdominal lymph nodes. Right internal iliac lymph node measures 3.2 cm, image 73 of series 7. Previously 6.7 cm. Right anterior pelvis lymph node measures 1.1 cm, image 76 of series 7. Previously 1.3 cm. 8 mm left posterior pelvic lymph node is identified, image 78 of series 7. Previously 1 cm. Reproductive: Enlargement of the prostate gland is again noted. There is a new low-attenuation fluid collection associated with the posterior gland which measures 5.1 x 3.9 x 4.2 cm, image 64 of series 10. Other: No free  fluid identified. Musculoskeletal: Stable appearance of scattered sclerotic foci including the 2.4 cm lesion in the right inferior pubic rami. IMPRESSION: 1. Interval development of new low-attenuation fluid collection involving the posterior aspect of the prostate gland. In the setting of recent biopsy and sepsis findings may represent abscess. Clinical correlation advised. 2. Similar appearance of solid enhancing  lesion involving the inferior pole of the right kidney compatible with a renal cell carcinoma. 3. Stable pelvic adenopathy. 4. Multifocal sclerotic foci involving the visualized portions of the axial skeleton. Suspicious for bone metastases. These results will be called to the ordering clinician or representative by the Radiologist Assistant, and communication documented in the PACS or zVision Dashboard. Electronically Signed   By: Kerby Moors M.D.   On: 12/28/2016 14:58     Scheduled Meds: . lisinopril  10 mg Oral Daily  . metoprolol succinate  12.5 mg Oral Daily  . pneumococcal 23 valent vaccine  0.5 mL Intramuscular Tomorrow-1000  . tamsulosin  0.4 mg Oral Daily   Continuous Infusions: . sodium chloride 50 mL/hr at 12/29/16 0800    Active Problems:   Urinary retention   Essential hypertension   Hyperglycemia   BPH (benign prostatic hyperplasia)   Renal mass   Thrombocytopenia (H[facility])   Sepsis ([place])   Time spent: 29 minutes  Irwin Brakeman, MD, FAAFP [facility] Pager [phone]  If 7PM-7AM, please contact night-coverage [email or web address] Password [email or web address] 12/30/2016, 10:33 AM    LOS: 3 days

## 2016-12-30 NOTE — Progress Notes (Signed)
Patient ID: John Shields, male   DOB: 1945-11-23, 71 y.o.   MRN: [id number]    Subjective: Pt improving overall.  Feels like he is still emptying bladder adequately.  Fever curve improved.  Still with perineal/rectal soreness and abdominal pain and some subjective chills.  Objective: Vital signs in last 24 hours: Temp:  [98 F (36.7 C)-99.2 F (37.3 C)] 98.1 F (36.7 C) (08/04 0510) Pulse Rate:  [90-98] 90 (08/04 0510) Resp:  [18-24] 18 (08/04 0510) BP: (154-167)/(87-106) 164/94 (08/04 0510) SpO2:  [95 %-97 %] 96 % (08/04 0510)  Intake/Output from previous day: 08/03 0701 - 08/04 0700 In: 1120 [P.O.:720; I.V.:400] Out: 5638 [Urine:3425] Intake/Output this shift: No intake/output data recorded.  Physical Exam:  General: Alert and oriented Abdomen: Soft, ND  Lab Results:  Recent Labs  12/28/16 0137 12/29/16 0602 12/30/16 0452  HGB 10.5* 11.5* 12.0*  HCT 30.3* 32.7* 33.4*   CBC Latest Ref Rng & Units 12/30/2016 12/29/2016 12/28/2016  WBC 4.0 - 10.5 K/uL 20.1(H) 18.8(H) 21.3(H)  Hemoglobin 13.0 - 17.0 g/dL 12.0(L) 11.5(L) 10.5(L)  Hematocrit 39.0 - 52.0 % 33.4(L) 32.7(L) 30.3(L)  Platelets 150 - 400 K/uL 117(L) 95(L) 97(L)     BMET  Recent Labs  12/29/16 0602 12/30/16 0452  NA 136 134*  K 4.0 3.8  CL 105 102  CO2 23 27  GLUCOSE 115* 137*  BUN 13 10  CREATININE 0.98 0.94  CALCIUM 8.0* 8.0*     Studies/Results: Ct Abdomen Pelvis W Wo Contrast  Result Date: 12/28/2016 CLINICAL DATA:  Sepsis. Evaluate for metastatic disease or abscess. Recent prostate biopsy and kidney biopsy. EXAM: CT ABDOMEN AND PELVIS WITHOUT AND WITH CONTRAST TECHNIQUE: Multidetector CT imaging of the abdomen and pelvis was performed following the standard protocol before and following the bolus administration of intravenous contrast. CONTRAST:  173mL ISOVUE-300 IOPAMIDOL (ISOVUE-300) INJECTION 61% COMPARISON:  5/20 5/8 FINDINGS: Lower chest: Small pleural effusions identified right greater than  left. Hepatobiliary: No focal liver abnormality. The gallbladder appears collapsed. No biliary dilatation. Pancreas: Normal appearance of the pancreas. Spleen: The spleen is normal. Adrenals/Urinary Tract: Normal adrenal glands. Solid enhancing lesion arising from the medial cortex of the inferior pole of right kidney measures 3.5 cm, image 47 of series 7. Unchanged from previous exam. Simple appearing cyst arising from upper pole of right kidney measures 3.7 cm. No hydronephrosis identified. Urinary bladder is collapsed around a Foley catheter. Stomach/Bowel: The stomach appears normal. The small bowel loops have a normal course and caliber. Vascular/Lymphatic: Aortic atherosclerosis noted. No aneurysm. No enlarged upper abdominal lymph nodes. Right internal iliac lymph node measures 3.2 cm, image 73 of series 7. Previously 6.7 cm. Right anterior pelvis lymph node measures 1.1 cm, image 76 of series 7. Previously 1.3 cm. 8 mm left posterior pelvic lymph node is identified, image 78 of series 7. Previously 1 cm. Reproductive: Enlargement of the prostate gland is again noted. There is a new low-attenuation fluid collection associated with the posterior gland which measures 5.1 x 3.9 x 4.2 cm, image 64 of series 10. Other: No free fluid identified. Musculoskeletal: Stable appearance of scattered sclerotic foci including the 2.4 cm lesion in the right inferior pubic rami. IMPRESSION: 1. Interval development of new low-attenuation fluid collection involving the posterior aspect of the prostate gland. In the setting of recent biopsy and sepsis findings may represent abscess. Clinical correlation advised. 2. Similar appearance of solid enhancing lesion involving the inferior pole of the right kidney compatible with a renal cell carcinoma.  3. Stable pelvic adenopathy. 4. Multifocal sclerotic foci involving the visualized portions of the axial skeleton. Suspicious for bone metastases. These results will be called to the  ordering clinician or representative by the Radiologist Assistant, and communication documented in the PACS or zVision Dashboard. Electronically Signed   By: Kerby Moors M.D.   On: 12/28/2016 14:58   Dg Chest 2 View  Result Date: 12/28/2016 CLINICAL DATA:  Fever EXAM: CHEST  2 VIEW COMPARISON:  Chest x-ray of 12/27/2016 FINDINGS: The lungs are not as well aerated. However, better seen on the lateral view, there do appear to be small effusions with some fluid in the fissures. In view of cardiomegaly, these findings most likely indicate mild congestive heart failure. No bony abnormality is seen. IMPRESSION: Cardiomegaly and small effusions may indicate mild congestive heart failure. Electronically Signed   By: Ivar Drape M.D.   On: 12/28/2016 08:18   Urine culture results reviewed.  Results for orders placed or performed during the hospital encounter of 12/27/16  Culture, blood (Routine x 2)     Status: None (Preliminary result)   Collection Time: 12/27/16  8:49 AM  Result Value Ref Range Status   Specimen Description BLOOD RIGHT ANTECUBITAL  Final   Special Requests   Final    BOTTLES DRAWN AEROBIC AND ANAEROBIC Blood Culture adequate volume   Culture  Setup Time   Final    GRAM POSITIVE COCCI GRAM NEGATIVE RODS ANAEROBIC BOTTLE ONLY Organism ID to follow CRITICAL RESULT CALLED TO, READ BACK BY AND VERIFIED WITH: Leonie Green PHARMD, AT 1111 12/29/16 BY D. VANHOOK    Culture GRAM POSITIVE COCCI GRAM NEGATIVE RODS   Final   Report Status PENDING  Incomplete  Blood Culture ID Panel (Reflexed)     Status: None   Collection Time: 12/27/16  8:49 AM  Result Value Ref Range Status   Enterococcus species NOT DETECTED NOT DETECTED Final   Listeria monocytogenes NOT DETECTED NOT DETECTED Final   Staphylococcus species NOT DETECTED NOT DETECTED Final   Staphylococcus aureus NOT DETECTED NOT DETECTED Final   Streptococcus species NOT DETECTED NOT DETECTED Final   Streptococcus agalactiae NOT  DETECTED NOT DETECTED Final   Streptococcus pneumoniae NOT DETECTED NOT DETECTED Final   Streptococcus pyogenes NOT DETECTED NOT DETECTED Final   Acinetobacter baumannii NOT DETECTED NOT DETECTED Final   Enterobacteriaceae species NOT DETECTED NOT DETECTED Final   Enterobacter cloacae complex NOT DETECTED NOT DETECTED Final   Escherichia coli NOT DETECTED NOT DETECTED Final   Klebsiella oxytoca NOT DETECTED NOT DETECTED Final   Klebsiella pneumoniae NOT DETECTED NOT DETECTED Final   Proteus species NOT DETECTED NOT DETECTED Final   Serratia marcescens NOT DETECTED NOT DETECTED Final   Haemophilus influenzae NOT DETECTED NOT DETECTED Final   Neisseria meningitidis NOT DETECTED NOT DETECTED Final   Pseudomonas aeruginosa NOT DETECTED NOT DETECTED Final   Candida albicans NOT DETECTED NOT DETECTED Final   Candida glabrata NOT DETECTED NOT DETECTED Final   Candida krusei NOT DETECTED NOT DETECTED Final   Candida parapsilosis NOT DETECTED NOT DETECTED Final   Candida tropicalis NOT DETECTED NOT DETECTED Final  Culture, blood (Routine x 2)     Status: None (Preliminary result)   Collection Time: 12/27/16  9:56 AM  Result Value Ref Range Status   Specimen Description BLOOD LEFT HAND  Final   Special Requests   Final    BOTTLES DRAWN AEROBIC AND ANAEROBIC Blood Culture adequate volume   Culture  Setup Time  Final    GRAM NEGATIVE RODS ANAEROBIC BOTTLE ONLY CRITICAL VALUE NOTED.  VALUE IS CONSISTENT WITH PREVIOUSLY REPORTED AND CALLED VALUE.    Culture GRAM NEGATIVE RODS  Final   Report Status PENDING  Incomplete  Urine culture     Status: Abnormal   Collection Time: 12/27/16 12:57 PM  Result Value Ref Range Status   Specimen Description URINE, RANDOM  Final   Special Requests NONE  Final   Culture >=100,000 COLONIES/mL ENTEROCOCCUS FAECALIS (A)  Final   Report Status 12/29/2016 FINAL  Final   Organism ID, Bacteria ENTEROCOCCUS FAECALIS (A)  Final      Susceptibility   Enterococcus  faecalis - MIC*    AMPICILLIN <=2 SENSITIVE Sensitive     LEVOFLOXACIN 1 SENSITIVE Sensitive     NITROFURANTOIN <=16 SENSITIVE Sensitive     VANCOMYCIN 1 SENSITIVE Sensitive     * >=100,000 COLONIES/mL ENTEROCOCCUS FAECALIS  MRSA PCR Screening     Status: None   Collection Time: 12/28/16 12:17 AM  Result Value Ref Range Status   MRSA by PCR NEGATIVE NEGATIVE Final    Comment:        The GeneXpert MRSA Assay (FDA approved for NASAL specimens only), is one component of a comprehensive MRSA colonization surveillance program. It is not intended to diagnose MRSA infection nor to guide or monitor treatment for MRSA infections.     Assessment/Plan: Prostatitis/small prostatic abscess s/p prostate biopsy - No indication for surgical intervention currently.  Pt gradually improving clinically but WBC increased this morning.  Based on cultures, levofloxacin likely a better choice for treatment due to relative poor lipid solubility/prostate tissue penetration of vancomycin.  Would either add levofloxacin or substitute in place of vancomycin. In addition, this would offer gram negative coverage for GNRs in blood culture.  Will continue to follow.   LOS: 3 days   Nasha Diss,LES 12/30/2016, 7:15 AM

## 2016-12-30 NOTE — Progress Notes (Signed)
Pharmacy Antibiotic Note  John Shields is a 71 y.o. male admitted on 12/27/2016 with an intra-abdominal infection (prostate abscess).  Pharmacy has been consulted for levofloxacin dosing. Urology following.  Patient was previously on vancomycin/cefepime. Urine culture positive for pan-sensitive E faecalis. Per urology, levofloxacin likely better choice given improved prostate penetration over vancomycin.   Plan: Initiate levofloxacin 750 mg IV Q24 hours Monitor renal function, clinical course, blood culture final results Follow up length of therapy  Height: 6' (182.9 cm) Weight: 211 lb 6.4 oz (95.9 kg) IBW/kg (Calculated) : 77.6  Temp (24hrs), Avg:98 F (36.7 C), Min:98 F (36.7 C), Max:98.1 F (36.7 C)   Recent Labs Lab 12/23/16 1546 12/27/16 0849 12/27/16 0909 12/27/16 1306 12/27/16 2021 12/28/16 0137 12/29/16 0602 12/30/16 0452  WBC 4.9 8.9  --   --   --  21.3* 18.8* 20.1*  CREATININE 1.25* 1.08  --   --   --  1.09 0.98 0.94  LATICACIDVEN  --   --  2.93* 3.72* 1.6 1.7  --   --     Estimated Creatinine Clearance: 87.8 mL/min (by C-G formula based on SCr of 0.94 mg/dL).    No Known Allergies  Antimicrobials this admission: 8/2 vanc>8/4 8/2 cefepime>8/4 8/4 levofloxacin>>   Microbiology results: 8/1 blood cx x1/2>>GPC/GNR (BCID nothing detected) 8/1 urine cx >>e.faecalis - pan sensitive  Thank you for allowing pharmacy to be a part of this patient's care.   Charlene Brooke, PharmD PGY1 [place] Resident Pager: [phone] After 3:30PM please call [place] [phone] 12/30/2016 10:46 AM

## 2016-12-30 NOTE — Progress Notes (Signed)
OT Cancellation Note  Patient Details Name: Treyvonne Tata MRN: [id number] DOB: [birth year]   Cancelled Treatment:    Reason Eval/Treat Not Completed: Pain limiting ability to participate (rectal pain). Pt declined for OT to re-attempt visit later today. Will attempt OT eval tomorrow as schedule allows.  Redmond Baseman, MS, OTR/L 12/30/2016, 12:55 PM

## 2016-12-31 LAB — CBC WITH DIFFERENTIAL/PLATELET
Basophils Absolute: 0 10*3/uL (ref 0.0–0.1)
Basophils Relative: 0 %
EOS PCT: 0 %
Eosinophils Absolute: 0 10*3/uL (ref 0.0–0.7)
HEMATOCRIT: 32.9 % — AB (ref 39.0–52.0)
HEMOGLOBIN: 11.5 g/dL — AB (ref 13.0–17.0)
LYMPHS ABS: 3.2 10*3/uL (ref 0.7–4.0)
Lymphocytes Relative: 14 %
MCH: 22.2 pg — ABNORMAL LOW (ref 26.0–34.0)
MCHC: 35 g/dL (ref 30.0–36.0)
MCV: 63.5 fL — ABNORMAL LOW (ref 78.0–100.0)
MONOS PCT: 11 %
Monocytes Absolute: 2.5 10*3/uL — ABNORMAL HIGH (ref 0.1–1.0)
Neutro Abs: 17.1 10*3/uL — ABNORMAL HIGH (ref 1.7–7.7)
Neutrophils Relative %: 75 %
Platelets: 144 10*3/uL — ABNORMAL LOW (ref 150–400)
RBC: 5.18 MIL/uL (ref 4.22–5.81)
RDW: 13.8 % (ref 11.5–15.5)
WBC: 22.8 10*3/uL — AB (ref 4.0–10.5)

## 2016-12-31 LAB — COMPREHENSIVE METABOLIC PANEL
ALBUMIN: 2.2 g/dL — AB (ref 3.5–5.0)
ALK PHOS: 110 U/L (ref 38–126)
ALT: 51 U/L (ref 17–63)
AST: 49 U/L — AB (ref 15–41)
Anion gap: 6 (ref 5–15)
BILIRUBIN TOTAL: 1.4 mg/dL — AB (ref 0.3–1.2)
BUN: 10 mg/dL (ref 6–20)
CALCIUM: 8.2 mg/dL — AB (ref 8.9–10.3)
CO2: 27 mmol/L (ref 22–32)
Chloride: 98 mmol/L — ABNORMAL LOW (ref 101–111)
Creatinine, Ser: 1.02 mg/dL (ref 0.61–1.24)
GFR calc Af Amer: >60 mL/min (ref >60)
GFR calc non Af Amer: >60 mL/min (ref >60)
GLUCOSE: 129 mg/dL — AB (ref 65–99)
Potassium: 4 mmol/L (ref 3.5–5.1)
Sodium: 131 mmol/L — ABNORMAL LOW (ref 135–145)
TOTAL PROTEIN: 6.5 g/dL (ref 6.5–8.1)

## 2016-12-31 MED ORDER — METOPROLOL SUCCINATE ER 25 MG PO TB24
25.0000 mg | ORAL_TABLET | Freq: Every day | ORAL | Status: DC
  Administered 2016-12-31 – 2017-01-02 (×3): 25 mg via ORAL
  Filled 2016-12-31 (×3): qty 1

## 2016-12-31 MED ORDER — CEFTRIAXONE SODIUM 2 G IJ SOLR
2.0000 g | INTRAMUSCULAR | Status: DC
  Administered 2016-12-31: 2 g via INTRAVENOUS
  Filled 2016-12-31: qty 2

## 2016-12-31 MED ORDER — PIPERACILLIN-TAZOBACTAM 3.375 G IVPB
3.3750 g | Freq: Three times a day (TID) | INTRAVENOUS | Status: DC
  Administered 2016-12-31 – 2017-01-02 (×6): 3.375 g via INTRAVENOUS
  Filled 2016-12-31 (×7): qty 50

## 2016-12-31 NOTE — Progress Notes (Signed)
12/31/2016 11:39 AM  Pharmacy recommending changing to zosyn given anaerobes present on cultures.   Murvin Natal MD

## 2016-12-31 NOTE — Progress Notes (Signed)
PROGRESS NOTE    John Shields  ZOX:[id number]  DOB: 1946/04/15  DOA: 12/27/2016 PCP: [facility]   Brief Admission Hx: John Shields is a 71 y.o. male with medical history significant for HTN, BPH, Urinary retention with self catheterization (not changed for about 6 months), presenting with increasing shortness of breath, fever up to 102.3, diaphoresis, malaise/generalized weakness.   MDM/Assessment & Plan:   Sepsis - from prostate infection/abscess, urology consulted, CTpelvis confirmed prostate abscess, responding to IV antibiotics, did not need to go to OR this morning per Dr. Zettie Pho note, continue current management.  Urine culture positive for enterococcus faecalis.  Pt still having chills.  Changed to IV levofloxacin given culture findings and urology recommendations 8/4, adding ceftriaxone 2 gm IV 8/5.     Metastatic prostate cancer - Pt followed by Dr. Tresa Moore for this and biopsies confirmed adenocarcinoma.   Right renal cell carcinoma - Pt followed by Dr. Tresa Moore and has appt to see him 8/27 to discuss treatment plans.  Thrombocytopenia - slightly improved, following.    Hyperbilirubinemia - improved with hydration  Elevated LFTs - Improved   Leukocytosis - now he is trending back up, will add ceftriaxone to levofloxacin 8/5.    Uncontrolled Hypertension - pt says that he weaned himself off of his blood pressure medications recently, he now has rebound hypertension and tachycardia, will start BP meds and follow.  Titrate BP meds further today, see orders.   DVT prophylaxis: SCDs Code Status: full  Family Communication: none present Disposition Plan: TBD  Consultants:  urology  Subjective: Pt having more rectal pain today    Objective: Vitals:   12/30/16 0510 12/30/16 1618 12/30/16 2146 12/31/16 0536  BP: (!) 164/94  (!) 146/82 (!) 153/93  Pulse: 90  99 86  Resp: 18  20 19   Temp: 98.1 F (36.7 C) 99.2 F (37.3 C) 98.8 F (37.1 C) 98.2 F (36.8 C)   TempSrc: Oral Oral Oral Oral  SpO2: 96%  96% 95%  Weight:      Height:        Intake/Output Summary (Last 24 hours) at 12/31/16 0941 Last data filed at 12/31/16 0541  Gross per 24 hour  Intake          1924.83 ml  Output             1850 ml  Net            74.83 ml   Filed Weights   12/27/16 0844 12/27/16 2245  Weight: 90.7 kg (200 lb) 95.9 kg (211 lb 6.4 oz)     REVIEW OF SYSTEMS  As per history otherwise all reviewed and reported negative  Exam:  General exam: awake, alert, NAD.  Respiratory system: Clear. No increased work of breathing. Cardiovascular system: S1 & S2 heard, RRR. No JVD, murmurs, gallops, clicks or pedal edema. Gastrointestinal system: Abdomen is nondistended, soft and nontender. Normal bowel sounds heard. Central nervous system: Alert and oriented. No focal neurological deficits. GU: foley in place Extremities: no CCE.  Data Reviewed: Basic Metabolic Panel:  Recent Labs Lab 12/27/16 0849 12/28/16 0137 12/29/16 0602 12/30/16 0452 12/31/16 0432  NA 135 136 136 134* 131*  K 3.5 3.8 4.0 3.8 4.0  CL 102 106 105 102 98*  CO2 24 23 23 27 27   GLUCOSE 126* 140* 115* 137* 129*  BUN 12 15 13 10 10   CREATININE 1.08 1.09 0.98 0.94 1.02  CALCIUM 8.3* 7.9* 8.0* 8.0* 8.2*   Liver Function  Tests:  Recent Labs Lab 12/27/16 0849 12/28/16 0137 12/29/16 0602 12/30/16 0452 12/31/16 0432  AST 75* 72* 51* 38 49*  ALT 64* 67* 52 44 51  ALKPHOS 323* 106 118 106 110  BILITOT 1.9* 0.8 1.5* 1.3* 1.4*  PROT 6.1* 5.7* 5.9* 6.3* 6.5  ALBUMIN 2.5* 2.2* 2.1* 2.2* 2.2*   No results for input(s): LIPASE, AMYLASE in the last 168 hours. No results for input(s): AMMONIA in the last 168 hours. CBC:  Recent Labs Lab 12/27/16 0849 12/28/16 0137 12/29/16 0602 12/30/16 0452 12/31/16 0432  WBC 8.9 21.3* 18.8* 20.1* 22.8*  NEUTROABS 8.0*  --  16.0* 15.9* 17.1*  HGB 12.9* 10.5* 11.5* 12.0* 11.5*  HCT 36.7* 30.3* 32.7* 33.4* 32.9*  MCV 64.7* 64.6* 63.5* 63.4*  63.5*  PLT 87* 97* 95* 117* 144*   Cardiac Enzymes: No results for input(s): CKTOTAL, CKMB, CKMBINDEX, TROPONINI in the last 168 hours. CBG (last 3)  No results for input(s): GLUCAP in the last 72 hours. Recent Results (from the past 240 hour(s))  Culture, blood (Routine x 2)     Status: None (Preliminary result)   Collection Time: 12/27/16  8:49 AM  Result Value Ref Range Status   Specimen Description BLOOD RIGHT ANTECUBITAL  Final   Special Requests   Final    BOTTLES DRAWN AEROBIC AND ANAEROBIC Blood Culture adequate volume   Culture  Setup Time   Final    GRAM POSITIVE COCCI GRAM NEGATIVE RODS ANAEROBIC BOTTLE ONLY CRITICAL RESULT CALLED TO, READ BACK BY AND VERIFIED WITH: Leonie Green PHARMD, AT 5809 12/29/16 BY D. VANHOOK    Culture   Final    GRAM POSITIVE COCCI GRAM NEGATIVE RODS HOLDING FOR POSSIBLE ANAEROBE    Report Status PENDING  Incomplete  Blood Culture ID Panel (Reflexed)     Status: None   Collection Time: 12/27/16  8:49 AM  Result Value Ref Range Status   Enterococcus species NOT DETECTED NOT DETECTED Final   Listeria monocytogenes NOT DETECTED NOT DETECTED Final   Staphylococcus species NOT DETECTED NOT DETECTED Final   Staphylococcus aureus NOT DETECTED NOT DETECTED Final   Streptococcus species NOT DETECTED NOT DETECTED Final   Streptococcus agalactiae NOT DETECTED NOT DETECTED Final   Streptococcus pneumoniae NOT DETECTED NOT DETECTED Final   Streptococcus pyogenes NOT DETECTED NOT DETECTED Final   Acinetobacter baumannii NOT DETECTED NOT DETECTED Final   Enterobacteriaceae species NOT DETECTED NOT DETECTED Final   Enterobacter cloacae complex NOT DETECTED NOT DETECTED Final   Escherichia coli NOT DETECTED NOT DETECTED Final   Klebsiella oxytoca NOT DETECTED NOT DETECTED Final   Klebsiella pneumoniae NOT DETECTED NOT DETECTED Final   Proteus species NOT DETECTED NOT DETECTED Final   Serratia marcescens NOT DETECTED NOT DETECTED Final   Haemophilus  influenzae NOT DETECTED NOT DETECTED Final   Neisseria meningitidis NOT DETECTED NOT DETECTED Final   Pseudomonas aeruginosa NOT DETECTED NOT DETECTED Final   Candida albicans NOT DETECTED NOT DETECTED Final   Candida glabrata NOT DETECTED NOT DETECTED Final   Candida krusei NOT DETECTED NOT DETECTED Final   Candida parapsilosis NOT DETECTED NOT DETECTED Final   Candida tropicalis NOT DETECTED NOT DETECTED Final  Culture, blood (Routine x 2)     Status: None (Preliminary result)   Collection Time: 12/27/16  9:56 AM  Result Value Ref Range Status   Specimen Description BLOOD LEFT HAND  Final   Special Requests   Final    BOTTLES DRAWN AEROBIC AND ANAEROBIC Blood Culture  adequate volume   Culture  Setup Time   Final    GRAM NEGATIVE RODS ANAEROBIC BOTTLE ONLY CRITICAL VALUE NOTED.  VALUE IS CONSISTENT WITH PREVIOUSLY REPORTED AND CALLED VALUE.    Culture   Final    GRAM NEGATIVE RODS HOLDING FOR POSSIBLE ANAEROBE    Report Status PENDING  Incomplete  Urine culture     Status: Abnormal   Collection Time: 12/27/16 12:57 PM  Result Value Ref Range Status   Specimen Description URINE, RANDOM  Final   Special Requests NONE  Final   Culture >=100,000 COLONIES/mL ENTEROCOCCUS FAECALIS (A)  Final   Report Status 12/29/2016 FINAL  Final   Organism ID, Bacteria ENTEROCOCCUS FAECALIS (A)  Final      Susceptibility   Enterococcus faecalis - MIC*    AMPICILLIN <=2 SENSITIVE Sensitive     LEVOFLOXACIN 1 SENSITIVE Sensitive     NITROFURANTOIN <=16 SENSITIVE Sensitive     VANCOMYCIN 1 SENSITIVE Sensitive     * >=100,000 COLONIES/mL ENTEROCOCCUS FAECALIS  MRSA PCR Screening     Status: None   Collection Time: 12/28/16 12:17 AM  Result Value Ref Range Status   MRSA by PCR NEGATIVE NEGATIVE Final    Comment:        The GeneXpert MRSA Assay (FDA approved for NASAL specimens only), is one component of a comprehensive MRSA colonization surveillance program. It is not intended to diagnose  MRSA infection nor to guide or monitor treatment for MRSA infections.      Studies: No results found.   Scheduled Meds: . amLODipine  5 mg Oral Daily  . lisinopril  10 mg Oral Daily  . metoprolol succinate  12.5 mg Oral Daily  . pneumococcal 23 valent vaccine  0.5 mL Intramuscular Tomorrow-1000  . polyethylene glycol  17 g Oral BID  . senna-docusate  1 tablet Oral BID  . tamsulosin  0.4 mg Oral Daily   Continuous Infusions: . sodium chloride 10 mL/hr at 12/30/16 1057  . cefTRIAXone (ROCEPHIN)  IV    . levofloxacin (LEVAQUIN) IV Stopped (12/30/16 1452)    Active Problems:   Urinary retention   Essential hypertension   Hyperglycemia   BPH (benign prostatic hyperplasia)   Renal mass   Thrombocytopenia ([place])   Sepsis ([place])   Time spent: 24 minutes  Irwin Brakeman, MD, FAAFP [facility] Pager [phone]  If 7PM-7AM, please contact night-coverage [email or web address] Password [email or web address] 12/31/2016, 9:41 AM    LOS: 4 days

## 2016-12-31 NOTE — Progress Notes (Signed)
Patient ID: John Shields, male   DOB: 03-22-1946, 71 y.o.   MRN: [id number]    Subjective: Pt with increased perirectal/perineal pain this morning.  He continues to subjectively void well with adequate UOP.  No fever.  Objective: Vital signs in last 24 hours: Temp:  [98.2 F (36.8 C)-99.2 F (37.3 C)] 98.2 F (36.8 C) (08/05 0536) Pulse Rate:  [86-99] 86 (08/05 0536) Resp:  [19-20] 19 (08/05 0536) BP: (146-153)/(82-93) 153/93 (08/05 0536) SpO2:  [95 %-96 %] 95 % (08/05 0536)  Intake/Output from previous day: 08/04 0701 - 08/05 0700 In: 1924.8 [P.O.:240; I.V.:1534.8; IV Piggyback:150] Out: 3350 [Urine:3350] Intake/Output this shift: No intake/output data recorded.  Physical Exam:  General: Alert and oriented  Lab Results:  Recent Labs  12/29/16 0602 12/30/16 0452 12/31/16 0432  HGB 11.5* 12.0* 11.5*  HCT 32.7* 33.4* 32.9*   CBC Latest Ref Rng & Units 12/31/2016 12/30/2016 12/29/2016  WBC 4.0 - 10.5 K/uL 22.8(H) 20.1(H) 18.8(H)  Hemoglobin 13.0 - 17.0 g/dL 11.5(L) 12.0(L) 11.5(L)  Hematocrit 39.0 - 52.0 % 32.9(L) 33.4(L) 32.7(L)  Platelets 150 - 400 K/uL 144(L) 117(L) 95(L)     BMET  Recent Labs  12/30/16 0452 12/31/16 0432  NA 134* 131*  K 3.8 4.0  CL 102 98*  CO2 27 27  GLUCOSE 137* 129*  BUN 10 10  CREATININE 0.94 1.02  CALCIUM 8.0* 8.2*     Studies/Results: No results found.  Assessment/Plan: 1) Prostatitis with small prostatic abscess:  Now on levofloxacin for 24 hrs based on sensitivities of enterococcus urine culture.  WBC continues to increase despite change in antibiotics.  Patient also with GNRs in both blood cultures.  It is certainly possible that his bacteremia may not be responsive to fluoroquinolone and it may be best to broaden coverage to cover flouroquinolone-resistant E. Coli pending final sensitivities of his blood cultures.  Ceftriaxone would be an appropriate choice to add to levofloxacin.  If patient does not demonstrate clinical  improvement over the next 24-48 hrs, he may require repeat imaging of the prostate to ensure his small prostatic abscess is not increasing in size.     LOS: 4 days   Tallie [name],LES 12/31/2016, 8:49 AM

## 2016-12-31 NOTE — Evaluation (Signed)
Occupational Therapy Evaluation Patient Details Name: John Shields MRN: [id number] DOB: [birth year] Today's Date: 12/31/2016    History of Present Illness 71 yo with history of HTN, BPH, Urinary retention with self catheterization, metastatic prostate CA. Admitted with urinary retention, prostate abscess and sepsis   Clinical Impression   PTA, pt was living alone and was independent. Currently, pt requires Max A for LB ADLs and Min Guard A for functional mobility. Pt demonstrating poor activity tolerance due to pain and fatigue. Will follow acutely to facilitate safe dc and increase pt occupational performance and participation. Recommend dc home with HHOT to increase safety and independence with ADLs and functional mobility.     Follow Up Recommendations  Supervision/Assistance - 24 hour;Home health OT (pending pt progress)   Equipment Recommendations  3 in 1 bedside commode    Recommendations for Other Services       Precautions / Restrictions Precautions Precautions: Fall Restrictions Weight Bearing Restrictions: No      Mobility Bed Mobility Overal bed mobility: Needs Assistance Bed Mobility: Supine to Sit;Sit to Supine     Supine to sit: Min assist;HOB elevated Sit to supine: Min guard   General bed mobility comments: Min A to help with powering trunk from sidelying to sitting.   Transfers Overall transfer level: Needs assistance Equipment used: None Transfers: Sit to/from Stand Sit to Stand: Min guard         General transfer comment: Min guard for safety and VCs for hand placement     Balance Overall balance assessment: Needs assistance Sitting-balance support: Bilateral upper extremity supported;Feet unsupported Sitting balance-Leahy Scale: Fair     Standing balance support: During functional activity Standing balance-Leahy Scale: Fair Standing balance comment: Pt able to maintain standing and take ~5 side steps without UE support                            ADL either performed or assessed with clinical judgement   ADL Overall ADL's : Needs assistance/impaired Eating/Feeding: Set up;Sitting   Grooming: Oral care;Set up;Supervision/safety;Sitting Grooming Details (indicate cue type and reason): Pt performed oral care while seated at EOB. Pt very fatigued from task and required increased time to perform supine>sit. Pt fatiques quickly and is limited by pain Upper Body Bathing: Set up;Sitting   Lower Body Bathing: Maximal assistance;Sit to/from stand   Upper Body Dressing : Set up;Sitting   Lower Body Dressing: Maximal assistance;Sit to/from stand Lower Body Dressing Details (indicate cue type and reason): Pt requires Max A to don socks due to pain and poor activity tolerance   Toilet Transfer Details (indicate cue type and reason): RN reports that pt walks up to [facility] over toilet in bathroom with Min Guard A using RW         Functional mobility during ADLs: Min guard (Side steps towards HOB with Min guard A) General ADL Comments: Pt demonstrating decreased functional performance and is limited by pain and poor activity tolerance     Vision         Perception     Praxis      Pertinent Vitals/Pain Pain Assessment: 0-10 Pain Score: 9  Pain Location: sacrum  Pain Descriptors / Indicators: Discomfort Pain Intervention(s): Monitored during session;Limited activity within patient's tolerance;Repositioned     Hand Dominance Left   Extremity/Trunk Assessment Upper Extremity Assessment Upper Extremity Assessment: Generalized weakness   Lower Extremity Assessment Lower Extremity Assessment: Generalized weakness   Cervical / Trunk  Assessment Cervical / Trunk Assessment: Kyphotic   Communication Communication Communication: No difficulties   Cognition Arousal/Alertness: Awake/alert Behavior During Therapy: WFL for tasks assessed/performed Overall Cognitive Status: Within Functional Limits for tasks assessed                                      General Comments       Exercises     Shoulder Instructions      Home Living Family/patient expects to be discharged to:: Private residence Living Arrangements: Alone Available Help at Discharge: Friend(s);Available PRN/intermittently Type of Home: Apartment Home Access: Level entry     Home Layout: One level     Bathroom Shower/Tub: Tub/shower unit;Curtain   Bathroom Toilet: Standard     Home Equipment: None          Prior Functioning/Environment Level of Independence: Independent        Comments: works at [employer] on [employer]        OT Problem List: Decreased strength;Decreased range of motion;Decreased activity tolerance;Impaired balance (sitting and/or standing);Decreased safety awareness;Decreased knowledge of use of DME or AE;Decreased knowledge of precautions;Pain      OT Treatment/Interventions: Self-care/ADL training;Therapeutic exercise;Energy conservation;DME and/or AE instruction;Therapeutic activities;Patient/family education    OT Goals(Current goals can be found in the care plan section) Acute Rehab OT Goals Patient Stated Goal: go home  OT Goal Formulation: With patient Time For Goal Achievement: 01/14/17 Potential to Achieve Goals: Good ADL Goals Pt Will Perform Grooming: with min guard assist;standing Pt Will Perform Lower Body Dressing: with min guard assist;sit to/from stand Pt Will Transfer to Toilet: with min guard assist;bedside commode;ambulating Pt Will Perform Toileting - Clothing Manipulation and hygiene: with min guard assist;sit to/from stand  OT Frequency: Min 2X/week   Barriers to D/C: Decreased caregiver support          Co-evaluation              AM-PAC PT "6 Clicks" Daily Activity     Outcome Measure Help from another person eating meals?: None Help from another person taking care of personal grooming?: A Little Help from another person toileting, which  includes using toliet, bedpan, or urinal?: A Little Help from another person bathing (including washing, rinsing, drying)?: A Little Help from another person to put on and taking off regular upper body clothing?: A Little Help from another person to put on and taking off regular lower body clothing?: A Little 6 Click Score: 19   End of Session Nurse Communication: Mobility status  Activity Tolerance: Patient limited by pain;Patient limited by fatigue Patient left: in bed;with call bell/phone within reach;with nursing/sitter in room  OT Visit Diagnosis: Unsteadiness on feet (R26.81);Other abnormalities of gait and mobility (R26.89);Muscle weakness (generalized) (M62.81);Pain Pain - Right/Left:  (Sacrum) Pain - part of body:  (Sacrum)                Time: 5176-1607 OT Time Calculation (min): 38 min Charges:  OT General Charges $OT Visit: 1 Procedure OT Evaluation $OT Eval Low Complexity: 1 Procedure OT Treatments $Self Care/Home Management : 23-37 mins G-Codes:     [place], OTR/L Acute Rehab Pager: [phone] Office: [place] 12/31/2016, 4:00 PM

## 2016-12-31 NOTE — Progress Notes (Signed)
Pharmacy Antibiotic Note  John Shields is a 71 y.o. male admitted on 12/27/2016 with intra-abdominal infection (prostate abscess) Pharmacy has been consulted for Zosyn dosing.  Patient previously on levofloxacin for suspected prostatitis with positive E faecalis urine culture. Now 1/2 blood culture growing clostridium and possible anaerobes. WBC 22.8, rising, patient is afebrile. Per MD note, patient still experiencing chills today. Due to potential anaerobic infection and lack of clinical improvement, pharmacy recommended switch to zosyn monotherapy.  Plan: Zosyn 3.375g IV q8h (4 hour infusion).  Follow up C&S, length of therapy Monitor renal function, clinical status   Height: 6' (182.9 cm) Weight: 211 lb 6.4 oz (95.9 kg) IBW/kg (Calculated) : 77.6  Temp (24hrs), Avg:98.7 F (37.1 C), Min:98.2 F (36.8 C), Max:99.2 F (37.3 C)   Recent Labs Lab 12/27/16 0849 12/27/16 0909 12/27/16 1306 12/27/16 2021 12/28/16 0137 12/29/16 0602 12/30/16 0452 12/31/16 0432  WBC 8.9  --   --   --  21.3* 18.8* 20.1* 22.8*  CREATININE 1.08  --   --   --  1.09 0.98 0.94 1.02  LATICACIDVEN  --  2.93* 3.72* 1.6 1.7  --   --   --     Estimated Creatinine Clearance: 80.9 mL/min (by C-G formula based on SCr of 1.02 mg/dL).    No Known Allergies  Antimicrobials this admission: Vanc 8/2>>8/4 Cefepime 8/2>>8/4 Levofloxacin 8/4>>8/5 Zosyn 8/5>>   Microbiology results: 8/1 blood x1/2>>clostridium, possible anaerobes, GPC/GNR 8/1 BCID>>negative 8/1 urine>>e.faecalis - pan sensitive 8/2 MRSA PCR>>negative  Thank you for allowing pharmacy to be a part of this patient's care.   Charlene Brooke, PharmD PGY1 [place] Resident Pager: [phone] After 3:30PM please call [place] [phone] 12/31/2016 12:58 PM

## 2017-01-01 LAB — CBC WITH DIFFERENTIAL/PLATELET
BASOS ABS: 0 10*3/uL (ref 0.0–0.1)
Basophils Relative: 0 %
EOS ABS: 0 10*3/uL (ref 0.0–0.7)
Eosinophils Relative: 0 %
HCT: 35.2 % — ABNORMAL LOW (ref 39.0–52.0)
Hemoglobin: 12.5 g/dL — ABNORMAL LOW (ref 13.0–17.0)
LYMPHS ABS: 2.7 10*3/uL (ref 0.7–4.0)
Lymphocytes Relative: 13 %
MCH: 22.7 pg — ABNORMAL LOW (ref 26.0–34.0)
MCHC: 35.5 g/dL (ref 30.0–36.0)
MCV: 63.9 fL — ABNORMAL LOW (ref 78.0–100.0)
MONO ABS: 2 10*3/uL — AB (ref 0.1–1.0)
MONOS PCT: 10 %
Neutro Abs: 15.7 10*3/uL — ABNORMAL HIGH (ref 1.7–7.7)
Neutrophils Relative %: 77 %
PLATELETS: 193 10*3/uL (ref 150–400)
RBC: 5.51 MIL/uL (ref 4.22–5.81)
RDW: 14.3 % (ref 11.5–15.5)
WBC: 20.4 10*3/uL — AB (ref 4.0–10.5)

## 2017-01-01 LAB — BASIC METABOLIC PANEL
ANION GAP: 8 (ref 5–15)
BUN: 13 mg/dL (ref 6–20)
CALCIUM: 8.5 mg/dL — AB (ref 8.9–10.3)
CO2: 26 mmol/L (ref 22–32)
CREATININE: 1.1 mg/dL (ref 0.61–1.24)
Chloride: 99 mmol/L — ABNORMAL LOW (ref 101–111)
Glucose, Bld: 117 mg/dL — ABNORMAL HIGH (ref 65–99)
Potassium: 4.3 mmol/L (ref 3.5–5.1)
SODIUM: 133 mmol/L — AB (ref 135–145)

## 2017-01-01 LAB — CULTURE, BLOOD (ROUTINE X 2): SPECIAL REQUESTS: ADEQUATE

## 2017-01-01 LAB — OSMOLALITY: OSMOLALITY: 283 mosm/kg (ref 275–295)

## 2017-01-01 MED ORDER — LISINOPRIL 20 MG PO TABS
20.0000 mg | ORAL_TABLET | Freq: Every day | ORAL | Status: DC
Start: 2017-01-01 — End: 2017-01-02
  Administered 2017-01-01 – 2017-01-02 (×2): 20 mg via ORAL
  Filled 2017-01-01 (×2): qty 1

## 2017-01-01 NOTE — Care Management Important Message (Signed)
Important Message  Patient Details  Name: John Shields MRN: [id number] Date of Birth: June 26, 1945   Medicare Important Message Given:  Yes    Nathen May 01/01/2017, 10:38 AM

## 2017-01-01 NOTE — Progress Notes (Signed)
PROGRESS NOTE    John Shields  GUY:[id number]  DOB: Jun 29, 1945  DOA: 12/27/2016 PCP: [facility]   Brief Admission Hx: John Shields is a 71 y.o. male with medical history significant for HTN, BPH, Urinary retention with self catheterization (not changed for about 6 months), presenting with increasing shortness of breath, fever up to 102.3, diaphoresis, malaise/generalized weakness.   MDM/Assessment & Plan:   Sepsis - from prostate infection/abscess, urology consulted, CTpelvis confirmed prostate abscess, cultures growing anaerobes and klebsiella, started on Zosyn IV, WBC still elevated but fever down.    Metastatic prostate cancer - Pt followed by Dr. Tresa Moore for this and biopsies confirmed adenocarcinoma.   Right renal cell carcinoma - Pt followed by Dr. Tresa Moore and has appt to see him 8/27 to discuss treatment plans.  Thrombocytopenia - improved, following.    Hyperbilirubinemia - improved with hydration  Elevated LFTs - Improved   Leukocytosis - continue zosyn IV for now.     Uncontrolled Hypertension - pt says that he weaned himself off of his blood pressure medications recently, he now has rebound hypertension and tachycardia, will start BP meds and follow.  Titrate BP meds further today, see orders.   DVT prophylaxis: SCDs Code Status: full  Family Communication: none present Disposition Plan: TBD  Consultants:  urology     Subjective: Pt was sleeping deeply this morning when I came in room to see him  Objective: Vitals:   12/31/16 2245 01/01/17 0503 01/01/17 0845 01/01/17 1356  BP: (!) 154/94 (!) 156/86 (!) 146/85 124/64  Pulse: 89 85  80  Resp: 18 18  16   Temp: 98.3 F (36.8 C) 98.3 F (36.8 C)  98 F (36.7 C)  TempSrc: Oral Oral  Oral  SpO2: 96% 96%  96%  Weight:      Height:        Intake/Output Summary (Last 24 hours) at 01/01/17 1401 Last data filed at 01/01/17 5638  Gross per 24 hour  Intake              100 ml  Output              2600 ml  Net            -2500 ml   Filed Weights   12/27/16 0844 12/27/16 2245  Weight: 90.7 kg (200 lb) 95.9 kg (211 lb 6.4 oz)     REVIEW OF SYSTEMS  As per history otherwise all reviewed and reported negative  Exam:  General exam: appears comfortable, NAD.   Respiratory system: Clear. No increased work of breathing. Cardiovascular system: S1 & S2 heard, RRR. No JVD, murmurs, gallops, clicks or pedal edema. Gastrointestinal system: Abdomen is nondistended, soft and nontender. Normal bowel sounds heard. Central nervous system: Alert and oriented. No focal neurological deficits. GU: foley in place Extremities: no CCE.  Data Reviewed: Basic Metabolic Panel:  Recent Labs Lab 12/28/16 0137 12/29/16 0602 12/30/16 0452 12/31/16 0432 01/01/17 0701  NA 136 136 134* 131* 133*  K 3.8 4.0 3.8 4.0 4.3  CL 106 105 102 98* 99*  CO2 23 23 27 27 26   GLUCOSE 140* 115* 137* 129* 117*  BUN 15 13 10 10 13   CREATININE 1.09 0.98 0.94 1.02 1.10  CALCIUM 7.9* 8.0* 8.0* 8.2* 8.5*   Liver Function Tests:  Recent Labs Lab 12/27/16 0849 12/28/16 0137 12/29/16 0602 12/30/16 0452 12/31/16 0432  AST 75* 72* 51* 38 49*  ALT 64* 67* 52 44 51  ALKPHOS 323*  106 118 106 110  BILITOT 1.9* 0.8 1.5* 1.3* 1.4*  PROT 6.1* 5.7* 5.9* 6.3* 6.5  ALBUMIN 2.5* 2.2* 2.1* 2.2* 2.2*   No results for input(s): LIPASE, AMYLASE in the last 168 hours. No results for input(s): AMMONIA in the last 168 hours. CBC:  Recent Labs Lab 12/27/16 0849 12/28/16 0137 12/29/16 0602 12/30/16 0452 12/31/16 0432 01/01/17 0701  WBC 8.9 21.3* 18.8* 20.1* 22.8* 20.4*  NEUTROABS 8.0*  --  16.0* 15.9* 17.1* 15.7*  HGB 12.9* 10.5* 11.5* 12.0* 11.5* 12.5*  HCT 36.7* 30.3* 32.7* 33.4* 32.9* 35.2*  MCV 64.7* 64.6* 63.5* 63.4* 63.5* 63.9*  PLT 87* 97* 95* 117* 144* 193   Cardiac Enzymes: No results for input(s): CKTOTAL, CKMB, CKMBINDEX, TROPONINI in the last 168 hours. CBG (last 3)  No results for input(s): GLUCAP  in the last 72 hours. Recent Results (from the past 240 hour(s))  Culture, blood (Routine x 2)     Status: Abnormal (Preliminary result)   Collection Time: 12/27/16  8:49 AM  Result Value Ref Range Status   Specimen Description BLOOD RIGHT ANTECUBITAL  Final   Special Requests   Final    BOTTLES DRAWN AEROBIC AND ANAEROBIC Blood Culture adequate volume   Culture  Setup Time   Final    GRAM POSITIVE COCCI GRAM NEGATIVE RODS ANAEROBIC BOTTLE ONLY CRITICAL RESULT CALLED TO, READ BACK BY AND VERIFIED WITH: Leonie Green PHARMD, AT 1111 12/29/16 BY D. VANHOOK    Culture (A)  Final    CLOSTRIDIUM CLOSTRIDIOFORME GRAM POSITIVE COCCI GRAM NEGATIVE RODS CRITICAL RESULT CALLED TO, READ BACK BY AND VERIFIED WITH: R RUMBARGER,PHARMD AT 1127 12/31/16 BY L BENFIELD CONCERNING GROWTH ON CULTURE    Report Status PENDING  Incomplete  Blood Culture ID Panel (Reflexed)     Status: None   Collection Time: 12/27/16  8:49 AM  Result Value Ref Range Status   Enterococcus species NOT DETECTED NOT DETECTED Final   Listeria monocytogenes NOT DETECTED NOT DETECTED Final   Staphylococcus species NOT DETECTED NOT DETECTED Final   Staphylococcus aureus NOT DETECTED NOT DETECTED Final   Streptococcus species NOT DETECTED NOT DETECTED Final   Streptococcus agalactiae NOT DETECTED NOT DETECTED Final   Streptococcus pneumoniae NOT DETECTED NOT DETECTED Final   Streptococcus pyogenes NOT DETECTED NOT DETECTED Final   Acinetobacter baumannii NOT DETECTED NOT DETECTED Final   Enterobacteriaceae species NOT DETECTED NOT DETECTED Final   Enterobacter cloacae complex NOT DETECTED NOT DETECTED Final   Escherichia coli NOT DETECTED NOT DETECTED Final   Klebsiella oxytoca NOT DETECTED NOT DETECTED Final   Klebsiella pneumoniae NOT DETECTED NOT DETECTED Final   Proteus species NOT DETECTED NOT DETECTED Final   Serratia marcescens NOT DETECTED NOT DETECTED Final   Haemophilus influenzae NOT DETECTED NOT DETECTED Final    Neisseria meningitidis NOT DETECTED NOT DETECTED Final   Pseudomonas aeruginosa NOT DETECTED NOT DETECTED Final   Candida albicans NOT DETECTED NOT DETECTED Final   Candida glabrata NOT DETECTED NOT DETECTED Final   Candida krusei NOT DETECTED NOT DETECTED Final   Candida parapsilosis NOT DETECTED NOT DETECTED Final   Candida tropicalis NOT DETECTED NOT DETECTED Final  Culture, blood (Routine x 2)     Status: Abnormal   Collection Time: 12/27/16  9:56 AM  Result Value Ref Range Status   Specimen Description BLOOD LEFT HAND  Final   Special Requests   Final    BOTTLES DRAWN AEROBIC AND ANAEROBIC Blood Culture adequate volume   Culture  Setup Time   Final    GRAM NEGATIVE RODS ANAEROBIC BOTTLE ONLY CRITICAL VALUE NOTED.  VALUE IS CONSISTENT WITH PREVIOUSLY REPORTED AND CALLED VALUE.    Culture CLOSTRIDIUM CLOSTRIDIOFORME (A)  Final   Report Status 01/01/2017 FINAL  Final  Urine culture     Status: Abnormal   Collection Time: 12/27/16 12:57 PM  Result Value Ref Range Status   Specimen Description URINE, RANDOM  Final   Special Requests NONE  Final   Culture >=100,000 COLONIES/mL ENTEROCOCCUS FAECALIS (A)  Final   Report Status 12/29/2016 FINAL  Final   Organism ID, Bacteria ENTEROCOCCUS FAECALIS (A)  Final      Susceptibility   Enterococcus faecalis - MIC*    AMPICILLIN <=2 SENSITIVE Sensitive     LEVOFLOXACIN 1 SENSITIVE Sensitive     NITROFURANTOIN <=16 SENSITIVE Sensitive     VANCOMYCIN 1 SENSITIVE Sensitive     * >=100,000 COLONIES/mL ENTEROCOCCUS FAECALIS  MRSA PCR Screening     Status: None   Collection Time: 12/28/16 12:17 AM  Result Value Ref Range Status   MRSA by PCR NEGATIVE NEGATIVE Final    Comment:        The GeneXpert MRSA Assay (FDA approved for NASAL specimens only), is one component of a comprehensive MRSA colonization surveillance program. It is not intended to diagnose MRSA infection nor to guide or monitor treatment for MRSA infections.       Studies: No results found.   Scheduled Meds: . amLODipine  5 mg Oral Daily  . lisinopril  20 mg Oral Daily  . metoprolol succinate  25 mg Oral Daily  . polyethylene glycol  17 g Oral BID  . senna-docusate  1 tablet Oral BID  . tamsulosin  0.4 mg Oral Daily   Continuous Infusions: . piperacillin-tazobactam (ZOSYN)  IV 3.375 g (01/01/17 1248)    Active Problems:   Urinary retention   Essential hypertension   Hyperglycemia   BPH (benign prostatic hyperplasia)   Renal mass   Thrombocytopenia ([employer])   Sepsis ([place])  Time spent: 15 minutes  Irwin Brakeman, MD, FAAFP [facility] Pager [phone]  If 7PM-7AM, please contact night-coverage [email or web address] Password [email or web address] 01/01/2017, 2:01 PM    LOS: 5 days

## 2017-01-02 LAB — CBC WITH DIFFERENTIAL/PLATELET
BASOS ABS: 0.1 10*3/uL (ref 0.0–0.1)
BASOS PCT: 1 %
EOS ABS: 0.1 10*3/uL (ref 0.0–0.7)
Eosinophils Relative: 1 %
HCT: 33.7 % — ABNORMAL LOW (ref 39.0–52.0)
Hemoglobin: 11.5 g/dL — ABNORMAL LOW (ref 13.0–17.0)
LYMPHS PCT: 27 %
Lymphs Abs: 2.9 10*3/uL (ref 0.7–4.0)
MCH: 22.2 pg — ABNORMAL LOW (ref 26.0–34.0)
MCHC: 34.1 g/dL (ref 30.0–36.0)
MCV: 65.1 fL — ABNORMAL LOW (ref 78.0–100.0)
MONO ABS: 1.2 10*3/uL — AB (ref 0.1–1.0)
Monocytes Relative: 11 %
NEUTROS PCT: 60 %
Neutro Abs: 6.4 10*3/uL (ref 1.7–7.7)
PLATELETS: 253 10*3/uL (ref 150–400)
RBC: 5.18 MIL/uL (ref 4.22–5.81)
RDW: 14.4 % (ref 11.5–15.5)
WBC: 10.7 10*3/uL — AB (ref 4.0–10.5)

## 2017-01-02 LAB — COMPREHENSIVE METABOLIC PANEL
ALT: 64 U/L — AB (ref 17–63)
AST: 65 U/L — AB (ref 15–41)
Albumin: 2.4 g/dL — ABNORMAL LOW (ref 3.5–5.0)
Alkaline Phosphatase: 80 U/L (ref 38–126)
Anion gap: 9 (ref 5–15)
BUN: 17 mg/dL (ref 6–20)
CHLORIDE: 99 mmol/L — AB (ref 101–111)
CO2: 26 mmol/L (ref 22–32)
CREATININE: 1.14 mg/dL (ref 0.61–1.24)
Calcium: 8.1 mg/dL — ABNORMAL LOW (ref 8.9–10.3)
GFR calc Af Amer: >60 mL/min (ref >60)
Glucose, Bld: 103 mg/dL — ABNORMAL HIGH (ref 65–99)
POTASSIUM: 4.3 mmol/L (ref 3.5–5.1)
SODIUM: 134 mmol/L — AB (ref 135–145)
Total Bilirubin: 1.1 mg/dL (ref 0.3–1.2)
Total Protein: 6.8 g/dL (ref 6.5–8.1)

## 2017-01-02 MED ORDER — LEVOFLOXACIN 500 MG PO TABS: 500.0000 mg | ORAL_TABLET | Freq: Every day | ORAL | 0 refills | Status: AC

## 2017-01-02 MED ORDER — METOPROLOL SUCCINATE ER 25 MG PO TB24: 25.0000 mg | ORAL_TABLET | Freq: Every day | ORAL | 0 refills | Status: DC

## 2017-01-02 MED ORDER — AMLODIPINE BESYLATE 5 MG PO TABS: 5.0000 mg | ORAL_TABLET | Freq: Every day | ORAL | 0 refills | Status: DC

## 2017-01-02 MED ORDER — LISINOPRIL 20 MG PO TABS: 20.0000 mg | ORAL_TABLET | Freq: Every day | ORAL | 0 refills | Status: DC

## 2017-01-02 NOTE — Progress Notes (Signed)
Patient requested to speak with CSW. Patient asked questions about who would get his money if he marrried his girlfriend and then suddenly passed away. CSW referred patient to DSS. Patient chatted with CSW and reported that he was eager to leave the hospital.   [place] signing off.  John Shields Sameena Artus LCSWA [phone]

## 2017-01-02 NOTE — Care Management Note (Signed)
Case Management Note  Patient Details  Name: Jakhai Fant MRN: [id number] Date of Birth: 1945-10-23  Subjective/Objective:         Presents with sepsis of unknown etiology.          Action/Plan: Plan is to d/c to home today. Hospttal post follow up appoint scheduled for 01/10/2017 @ 11:15am with Dorothyann Peng NP@ [facility] @ [place].  Expected Discharge Date:  01/02/17               Expected Discharge Plan:  [place]  In-House Referral:     Discharge planning Services  CM Consult  Post Acute Care Choice:    Choice offered to:  Patient  DME Arranged:  3-N-1, [place] rolling DME Agency:  [place]., pt refused states he doesn't need equipment  HH Arranged:  RN, PT, OT, Nurse's Aide [place] Agency:   [facility]  Status of Service:  Completed, signed off  If discussed at [place] of Stay Meetings, dates discussed:    Additional Comments:  Sharin Mons, RN 01/02/2017, 12:13 PM

## 2017-01-02 NOTE — Progress Notes (Addendum)
PROGRESS NOTE  John Shields  NOM:[id number]  DOB: 08/30/1945  DOA: 12/27/2016 [facility]CP: [facility]  Brief Admission Hx: John Shields is a 71 y.o. male with medical history significant for HTN, BPH, Urinary retention with self catheterization (not changed for about 6 months), presenting with increasing shortness of breath, fever up to 102.3, diaphoresis, malaise/generalized weakness.   MDM/Assessment & Plan:   Sepsis - from prostate infection/abscess, urology consulted, CTpelvis confirmed prostate abscess, cultures growing anaerobes and klebsiella, started on Zosyn IV, WBC still elevated but starting to trend down, plan for discharge on oral levofloxacin x 4 weeks.  Follow up with Dr. Tresa Moore  Metastatic prostate cancer - Pt followed by Dr. Tresa Moore for this and biopsies confirmed adenocarcinoma.   Right renal cell carcinoma - Pt followed by Dr. Tresa Moore and has appt to see him 8/27 to discuss treatment plans.  Thrombocytopenia - improved, following.    Hyperbilirubinemia - improved with hydration  Elevated LFTs - Improved   Weakness - still waiting on PT eval, OT is recommending HHOT.    Leukocytosis - WBC trending down, plan to transition to oral levofloxacin   Uncontrolled Hypertension - pt says that he weaned himself off of his blood pressure medications recently, he now has rebound hypertension and tachycardia, will start BP meds and follow.  Titrate BP meds further today, see orders.  Starting to improve.  I'm concerned about compliance after he is discharged home.    DVT prophylaxis: SCDs Code Status: full  Family Communication: none present Disposition Plan: discharge home  Consultants:  urology   Subjective: Pt says he feels weak but overall improving.   Objective: Vitals:   01/01/17 0845 01/01/17 1356 01/01/17 2214 01/02/17 0535  BP: (!) 146/85 124/64 (!) 141/75 120/70  Pulse:  80 86 82  Resp:  16 17 17   Temp:  98 F (36.7 C) 98.1 F (36.7 C) 98.3 F (36.8  C)  TempSrc:  Oral Oral Oral  SpO2:  96% 95% 95%  Weight:      Height:        Intake/Output Summary (Last 24 hours) at 01/02/17 0959 Last data filed at 01/02/17 0514  Gross per 24 hour  Intake              860 ml  Output              850 ml  Net               10 ml   Filed Weights   12/27/16 0844 12/27/16 2245  Weight: 90.7 kg (200 lb) 95.9 kg (211 lb 6.4 oz)     REVIEW OF SYSTEMS  As per history otherwise all reviewed and reported negative  Exam:  General exam: appears comfortable, NAD.   Respiratory system: Clear. No increased work of breathing. Cardiovascular system: S1 & S2 heard, RRR. No JVD, murmurs, gallops, clicks or pedal edema. Gastrointestinal system: Abdomen is nondistended, soft and nontender. Normal bowel sounds heard. Central nervous system: Alert and oriented. No focal neurological deficits. GU: foley in place Extremities: no CCE.  Data Reviewed: Basic Metabolic Panel:  Recent Labs Lab 12/29/16 0602 12/30/16 0452 12/31/16 0432 01/01/17 0701 01/02/17 0412  NA 136 134* 131* 133* 134*  K 4.0 3.8 4.0 4.3 4.3  CL 105 102 98* 99* 99*  CO2 23 27 27 26 26   GLUCOSE 115* 137* 129* 117* 103*  BUN 13 10 10 13 17   CREATININE 0.98 0.94 1.02 1.10 1.14  CALCIUM 8.0*  8.0* 8.2* 8.5* 8.1*   Liver Function Tests:  Recent Labs Lab 12/28/16 0137 12/29/16 0602 12/30/16 0452 12/31/16 0432 01/02/17 0412  AST 72* 51* 38 49* 65*  ALT 67* 52 44 51 64*  ALKPHOS 106 118 106 110 80  BILITOT 0.8 1.5* 1.3* 1.4* 1.1  PROT 5.7* 5.9* 6.3* 6.5 6.8  ALBUMIN 2.2* 2.1* 2.2* 2.2* 2.4*   No results for input(s): LIPASE, AMYLASE in the last 168 hours. No results for input(s): AMMONIA in the last 168 hours. CBC:  Recent Labs Lab 12/29/16 0602 12/30/16 0452 12/31/16 0432 01/01/17 0701 01/02/17 0412  WBC 18.8* 20.1* 22.8* 20.4* 10.7*  NEUTROABS 16.0* 15.9* 17.1* 15.7* 6.4  HGB 11.5* 12.0* 11.5* 12.5* 11.5*  HCT 32.7* 33.4* 32.9* 35.2* 33.7*  MCV 63.5* 63.4*  63.5* 63.9* 65.1*  PLT 95* 117* 144* 193 253   Cardiac Enzymes: No results for input(s): CKTOTAL, CKMB, CKMBINDEX, TROPONINI in the last 168 hours. CBG (last 3)  No results for input(s): GLUCAP in the last 72 hours. Recent Results (from the past 240 hour(s))  Culture, blood (Routine x 2)     Status: Abnormal (Preliminary result)   Collection Time: 12/27/16  8:49 AM  Result Value Ref Range Status   Specimen Description BLOOD RIGHT ANTECUBITAL  Final   Special Requests   Final    BOTTLES DRAWN AEROBIC AND ANAEROBIC Blood Culture adequate volume   Culture  Setup Time   Final    GRAM POSITIVE COCCI GRAM NEGATIVE RODS ANAEROBIC BOTTLE ONLY CRITICAL RESULT CALLED TO, READ BACK BY AND VERIFIED WITH: Leonie Green PHARMD, AT 1111 12/29/16 BY D. VANHOOK    Culture (A)  Final    CLOSTRIDIUM CLOSTRIDIOFORME GRAM POSITIVE COCCI GRAM NEGATIVE RODS CRITICAL RESULT CALLED TO, READ BACK BY AND VERIFIED WITH: R RUMBARGER,PHARMD AT 1127 12/31/16 BY L BENFIELD CONCERNING GROWTH ON CULTURE    Report Status PENDING  Incomplete  Blood Culture ID Panel (Reflexed)     Status: None   Collection Time: 12/27/16  8:49 AM  Result Value Ref Range Status   Enterococcus species NOT DETECTED NOT DETECTED Final   Listeria monocytogenes NOT DETECTED NOT DETECTED Final   Staphylococcus species NOT DETECTED NOT DETECTED Final   Staphylococcus aureus NOT DETECTED NOT DETECTED Final   Streptococcus species NOT DETECTED NOT DETECTED Final   Streptococcus agalactiae NOT DETECTED NOT DETECTED Final   Streptococcus pneumoniae NOT DETECTED NOT DETECTED Final   Streptococcus pyogenes NOT DETECTED NOT DETECTED Final   Acinetobacter baumannii NOT DETECTED NOT DETECTED Final   Enterobacteriaceae species NOT DETECTED NOT DETECTED Final   Enterobacter cloacae complex NOT DETECTED NOT DETECTED Final   Escherichia coli NOT DETECTED NOT DETECTED Final   Klebsiella oxytoca NOT DETECTED NOT DETECTED Final   Klebsiella pneumoniae NOT  DETECTED NOT DETECTED Final   Proteus species NOT DETECTED NOT DETECTED Final   Serratia marcescens NOT DETECTED NOT DETECTED Final   Haemophilus influenzae NOT DETECTED NOT DETECTED Final   Neisseria meningitidis NOT DETECTED NOT DETECTED Final   Pseudomonas aeruginosa NOT DETECTED NOT DETECTED Final   Candida albicans NOT DETECTED NOT DETECTED Final   Candida glabrata NOT DETECTED NOT DETECTED Final   Candida krusei NOT DETECTED NOT DETECTED Final   Candida parapsilosis NOT DETECTED NOT DETECTED Final   Candida tropicalis NOT DETECTED NOT DETECTED Final  Culture, blood (Routine x 2)     Status: Abnormal   Collection Time: 12/27/16  9:56 AM  Result Value Ref Range Status  Specimen Description BLOOD LEFT HAND  Final   Special Requests   Final    BOTTLES DRAWN AEROBIC AND ANAEROBIC Blood Culture adequate volume   Culture  Setup Time   Final    GRAM NEGATIVE RODS ANAEROBIC BOTTLE ONLY CRITICAL VALUE NOTED.  VALUE IS CONSISTENT WITH PREVIOUSLY REPORTED AND CALLED VALUE.    Culture CLOSTRIDIUM CLOSTRIDIOFORME (A)  Final   Report Status 01/01/2017 FINAL  Final  Urine culture     Status: Abnormal   Collection Time: 12/27/16 12:57 PM  Result Value Ref Range Status   Specimen Description URINE, RANDOM  Final   Special Requests NONE  Final   Culture >=100,000 COLONIES/mL ENTEROCOCCUS FAECALIS (A)  Final   Report Status 12/29/2016 FINAL  Final   Organism ID, Bacteria ENTEROCOCCUS FAECALIS (A)  Final      Susceptibility   Enterococcus faecalis - MIC*    AMPICILLIN <=2 SENSITIVE Sensitive     LEVOFLOXACIN 1 SENSITIVE Sensitive     NITROFURANTOIN <=16 SENSITIVE Sensitive     VANCOMYCIN 1 SENSITIVE Sensitive     * >=100,000 COLONIES/mL ENTEROCOCCUS FAECALIS  MRSA PCR Screening     Status: None   Collection Time: 12/28/16 12:17 AM  Result Value Ref Range Status   MRSA by PCR NEGATIVE NEGATIVE Final    Comment:        The GeneXpert MRSA Assay (FDA approved for NASAL specimens only), is  one component of a comprehensive MRSA colonization surveillance program. It is not intended to diagnose MRSA infection nor to guide or monitor treatment for MRSA infections.      Studies: No results found.   Scheduled Meds: . amLODipine  5 mg Oral Daily  . lisinopril  20 mg Oral Daily  . metoprolol succinate  25 mg Oral Daily  . polyethylene glycol  17 g Oral BID  . senna-docusate  1 tablet Oral BID  . tamsulosin  0.4 mg Oral Daily   Continuous Infusions: . piperacillin-tazobactam (ZOSYN)  IV Stopped (01/02/17 0914)    Active Problems:   Urinary retention   Essential hypertension   Hyperglycemia   BPH (benign prostatic hyperplasia)   Renal mass   Thrombocytopenia ([place])   Sepsis ([place])  Time spent: 19 minutes  Irwin Brakeman, MD, FAAFP [facility] Pager [phone]  If 7PM-7AM, please contact night-coverage [email or web address] Password [email or web address] 01/02/2017, 9:59 AM    LOS: 6 days

## 2017-01-02 NOTE — Progress Notes (Signed)
Mertha Finders to be D/C'd Home per MD order.  Discussed with the patient and all questions fully answered.  VSS, Skin clean, dry and intact without evidence of skin break down, no evidence of skin tears noted. IV catheter discontinued intact. Site without signs and symptoms of complications. Dressing and pressure applied.  An After Visit Summary was printed and given to the patient. Patient received prescription.  D/c education completed with patient/family including follow up instructions, medication list, d/c activities limitations if indicated, with other d/c instructions as indicated by MD - patient able to verbalize understanding, all questions fully answered.   Patient instructed to return to ED, call 911, or call MD for any changes in condition.   Patient escorted via [place], and D/C home via private auto.  [place] 01/02/2017 3:23 PM

## 2017-01-02 NOTE — Discharge Instructions (Signed)
°  Follow with Primary MD  [facility]  and other consultant's as instructed your Hospitalist MD  Please get a complete blood count and chemistry panel checked by your Primary MD at your next visit, and again as instructed by your Primary MD.  Get Medicines reviewed and adjusted: Please take all your medications with you for your next visit with your Primary MD  Laboratory/radiological data: Please request your Primary MD to go over all hospital tests and procedure/radiological results at the follow up, please ask your Primary MD to get all Hospital records sent to his/her office.  In some cases, they will be blood work, cultures and biopsy results pending at the time of your discharge. Please request that your primary care M.D. follows up on these results.  Also Note the following: If you experience worsening of your admission symptoms, develop shortness of breath, life threatening emergency, suicidal or homicidal thoughts you must seek medical attention immediately by calling 911 or calling your MD immediately  if symptoms less severe.  You must read complete instructions/literature along with all the possible adverse reactions/side effects for all the Medicines you take and that have been prescribed to you. Take any new Medicines after you have completely understood and accpet all the possible adverse reactions/side effects.   Do not drive when taking Pain medications or sleeping medications (Benzodaizepines)  Do not take more than prescribed Pain, Sleep and Anxiety Medications. It is not advisable to combine anxiety,sleep and pain medications without talking with your primary care practitioner  Special Instructions: If you have smoked or chewed Tobacco  in the last 2 yrs please stop smoking, stop any regular Alcohol  and or any Recreational drug use.  Wear Seat belts while driving.  Please note: You were cared for by a hospitalist during your hospital stay. Once you are  discharged, your primary care physician will handle any further medical issues. Please note that NO REFILLS for any discharge medications will be authorized once you are discharged, as it is imperative that you return to your primary care physician (or establish a relationship with a primary care physician if you do not have one) for your post hospital discharge needs so that they can reassess your need for medications and monitor your lab values.

## 2017-01-02 NOTE — Discharge Summary (Addendum)
Physician Discharge Summary  John Shields EQA:[id number] DOB: [birth year] DOA: 12/27/2016  PCP: [facility], [facility]rology: Dr. Tresa Shields  Admit date: 12/27/2016 Discharge date: 01/02/2017  Admitted From: HOME  Disposition: HOME   Recommendations for Outpatient Follow-up:  1. Follow up with PCP in 1 weeks 2. Follow up with urologist Dr. Tresa Shields on 01/22/17 as scheduled 3. Please obtain BMP/CBC in one week  Home Health: PT/OT  Discharge Condition: STABLE  CODE STATUS: FULL    Brief Hospitalization Summary: Please see all hospital notes, images, labs for full details of the hospitalization.  HPI: John Shields is a 71 y.o. male with medical history significant for HTN, BPH, Urinary retention with self catheterization (not changed for about 6 months), presenting with increasing shortness of breath, fever upto 102.3, diaphoresis, malaise/generalized weakness. This was preceded by a presentation to the emergency department and 12/23/2016, when nausea vomiting lightheadedness, but without syncope or falls  Tn was negative and CT angio was negative for PE. He was discharged home in stable condition after IVF administration. On today's visit, his symptoms are unresolved. He reports dry cough. Denies lower extremity swelling.  Denies abdominal pain. Appetite is normal. Denies any dysuria. Denies abnormal skin rashes. Denies sick contacts or recent trips or insect bites. Denies any bleeding issues such as epistaxis, hematemesis or hematochezia. He denies gross hematuria , His main complaint is "not feeling well".   ED Course:  BP 100/72   Pulse (!) 104   Temp (!) 102.3 F (39.1 C) (Rectal)   Resp (!) 28   Ht 6' (1.829 m)   Wt 90.7 kg (200 lb)   SpO2 94%   BMI 27.12 kg/m   UA is negative CXR NAD  sodium 135 potassium 3.5 glucose 126 creatinine 1.08  alkaline phosphatase 323 abdominal ultrasound pending lactic acid 2.93, now 3.72 WBC 8.9  Hb 12.9  Plts 87   Brief Admission Hx: Felis Shields a 71 y.o.malewith medical history significant for HTN, BPH, Urinary retention with self catheterization (not changed for about 6 months), presenting with increasing shortness of breath, fever up to 102.3, diaphoresis, malaise/generalized weakness.   MDM/Assessment & Plan:   Sepsis - from prostate infection/abscess, urology consulted, CTpelvis confirmed prostate abscess, cultures growing anaerobes and klebsiella, started on Zosyn IV, WBC still elevated but starting to trend down, plan for discharge on oral levofloxacin x 4 weeks.  Follow up with Dr. Tresa Shields  Metastatic prostate cancer - Pt followed by Dr. Tresa Shields for this and biopsies confirmed adenocarcinoma.   Right renal cell carcinoma - Pt followed by Dr. Tresa Shields and has appt to see him 8/27 to discuss treatment plans.  Essential Hypertension  Thrombocytopenia - improved, following.    Hyperbilirubinemia - improved with hydration  Elevated LFTs - Improved   Weakness - still waiting on PT eval, OT is recommending HHOT.    Leukocytosis - WBC trending down, plan to transition to oral levofloxacin   Uncontrolled Hypertension - pt says that he weaned himself off of his blood pressure medications recently, he now has rebound hypertension and tachycardia, will start BP meds and follow.  Titrate BP meds further today, see orders.  Starting to improve.  I'm concerned about compliance after he is discharged home.    DVT prophylaxis: SCDs Code Status: full  Family Communication: none present Disposition Plan: discharge home  Consultants:  urology   Discharge Diagnoses:  Active Problems:   Urinary retention   Essential hypertension   Hyperglycemia   BPH (benign prostatic hyperplasia)  Renal mass   Thrombocytopenia ([place])   Sepsis [facility])    Discharge Instructions: Discharge Instructions    Increase activity slowly    Complete by:  As directed      Allergies as of 01/02/2017   No Known Allergies      Medication List    STOP taking these medications   senna 8.6 MG Tabs tablet Commonly known as:  SENOKOT     TAKE these medications   amLODipine 5 MG tablet Commonly known as:  NORVASC Take 1 tablet (5 mg total) by mouth daily.   levofloxacin 500 MG tablet Commonly known as:  LEVAQUIN Take 1 tablet (500 mg total) by mouth daily.   lisinopril 20 MG tablet Commonly known as:  PRINIVIL,ZESTRIL Take 1 tablet (20 mg total) by mouth daily.   metoprolol succinate 25 MG 24 hr tablet Commonly known as:  TOPROL-XL Take 1 tablet (25 mg total) by mouth daily.   polyethylene glycol packet Commonly known as:  MIRALAX / GLYCOLAX Take 17 g by mouth daily.   tamsulosin 0.4 MG Caps capsule Commonly known as:  FLOMAX Take 1 capsule (0.4 mg total) by mouth daily.      Follow-up Information    John Frock, MD Follow up on 01/22/2017.   Specialty:  Urology Why:  at [facility] as scheduled for MD visit.  Contact information: [place] [address] [phone]        Clinic, [facility] Schedule an appointment as soon as possible for a visit in 1 week(s).   Why:  Hospital Follow Up Contact information: [place] [address] [phone]          No Known Allergies Current Discharge Medication List    START taking these medications   Details  amLODipine (NORVASC) 5 MG tablet Take 1 tablet (5 mg total) by mouth daily. Qty: 30 tablet, Refills: 0    levofloxacin (LEVAQUIN) 500 MG tablet Take 1 tablet (500 mg total) by mouth daily. Qty: 30 tablet, Refills: 0    lisinopril (PRINIVIL,ZESTRIL) 20 MG tablet Take 1 tablet (20 mg total) by mouth daily. Qty: 30 tablet, Refills: 0    metoprolol succinate (TOPROL-XL) 25 MG 24 hr tablet Take 1 tablet (25 mg total) by mouth daily. Qty: 30 tablet, Refills: 0      CONTINUE these medications which have NOT CHANGED   Details  polyethylene glycol (MIRALAX / GLYCOLAX) packet Take 17 g  by mouth daily. Qty: 14 each, Refills: 0    tamsulosin (FLOMAX) 0.4 MG CAPS capsule Take 1 capsule (0.4 mg total) by mouth daily. Qty: 15 capsule, Refills: 0      STOP taking these medications     senna (SENOKOT) 8.6 MG TABS tablet        Procedures/Studies: Ct Abdomen Pelvis W Wo Contrast  Result Date: 12/28/2016 CLINICAL DATA:  Sepsis. Evaluate for metastatic disease or abscess. Recent prostate biopsy and kidney biopsy. EXAM: CT ABDOMEN AND PELVIS WITHOUT AND WITH CONTRAST TECHNIQUE: Multidetector CT imaging of the abdomen and pelvis was performed following the standard protocol before and following the bolus administration of intravenous contrast. CONTRAST:  132mL ISOVUE-300 IOPAMIDOL (ISOVUE-300) INJECTION 61% COMPARISON:  5/20 5/8 FINDINGS: Lower chest: Small pleural effusions identified right greater than left. Hepatobiliary: No focal liver abnormality. The gallbladder appears collapsed. No biliary dilatation. Pancreas: Normal appearance of the pancreas. Spleen: The spleen is normal. Adrenals/Urinary Tract: Normal adrenal glands. Solid enhancing lesion arising from the medial cortex of  the inferior pole of right kidney measures 3.5 cm, image 47 of series 7. Unchanged from previous exam. Simple appearing cyst arising from upper pole of right kidney measures 3.7 cm. No hydronephrosis identified. Urinary bladder is collapsed around a Foley catheter. Stomach/Bowel: The stomach appears normal. The small bowel loops have a normal course and caliber. Vascular/Lymphatic: Aortic atherosclerosis noted. No aneurysm. No enlarged upper abdominal lymph nodes. Right internal iliac lymph node measures 3.2 cm, image 73 of series 7. Previously 6.7 cm. Right anterior pelvis lymph node measures 1.1 cm, image 76 of series 7. Previously 1.3 cm. 8 mm left posterior pelvic lymph node is identified, image 78 of series 7. Previously 1 cm. Reproductive: Enlargement of the prostate gland is again noted. There is a new  low-attenuation fluid collection associated with the posterior gland which measures 5.1 x 3.9 x 4.2 cm, image 64 of series 10. Other: No free fluid identified. Musculoskeletal: Stable appearance of scattered sclerotic foci including the 2.4 cm lesion in the right inferior pubic rami. IMPRESSION: 1. Interval development of new low-attenuation fluid collection involving the posterior aspect of the prostate gland. In the setting of recent biopsy and sepsis findings may represent abscess. Clinical correlation advised. 2. Similar appearance of solid enhancing lesion involving the inferior pole of the right kidney compatible with a renal cell carcinoma. 3. Stable pelvic adenopathy. 4. Multifocal sclerotic foci involving the visualized portions of the axial skeleton. Suspicious for bone metastases. These results will be called to the ordering clinician or representative by the Radiologist Assistant, and communication documented in the PACS or zVision Dashboard. Electronically Signed   By: Kerby Moors M.D.   On: 12/28/2016 14:58   Dg Chest 2 View  Result Date: 12/28/2016 CLINICAL DATA:  Fever EXAM: CHEST  2 VIEW COMPARISON:  Chest x-ray of 12/27/2016 FINDINGS: The lungs are not as well aerated. However, better seen on the lateral view, there do appear to be small effusions with some fluid in the fissures. In view of cardiomegaly, these findings most likely indicate mild congestive heart failure. No bony abnormality is seen. IMPRESSION: Cardiomegaly and small effusions may indicate mild congestive heart failure. Electronically Signed   By: Ivar Drape M.D.   On: 12/28/2016 08:18   Ct Angio Chest Pe W And/or Wo Contrast  Result Date: 12/23/2016 CLINICAL DATA:  Elevated D-dimer level. EXAM: CT ANGIOGRAPHY CHEST WITH CONTRAST TECHNIQUE: Multidetector CT imaging of the chest was performed using the standard protocol during bolus administration of intravenous contrast. Multiplanar CT image reconstructions and MIPs were  obtained to evaluate the vascular anatomy. CONTRAST:  100 mL of Isovue 370 intravenously. COMPARISON:  None. FINDINGS: Cardiovascular: Satisfactory opacification of the pulmonary arteries to the segmental level. No evidence of pulmonary embolism. Normal heart size. No pericardial effusion. Mediastinum/Nodes: No enlarged mediastinal, hilar, or axillary lymph nodes. Thyroid gland, trachea, and esophagus demonstrate no significant findings. Lungs/Pleura: Lungs are clear. No pleural effusion or pneumothorax. Upper Abdomen: No acute abnormality. Musculoskeletal: No chest wall abnormality. No acute or significant osseous findings. Review of the MIP images confirms the above findings. IMPRESSION: No definite evidence of pulmonary embolus. No acute cardiopulmonary abnormality seen. Electronically Signed   By: Marijo Conception, M.D.   On: 12/23/2016 20:21   US Abdomen Limited  Result Date: 12/27/2016 CLINICAL DATA:  Right upper quadrant pain beginning at 6 a.m. today. History of hypertension. EXAM: ULTRASOUND ABDOMEN LIMITED RIGHT UPPER QUADRANT COMPARISON:  Abdominal and pelvic CT scan of Oct 20, 2016 FINDINGS: Gallbladder: The  gallbladder is adequately distended. No stones or sludge are observed. There is no gallbladder wall thickening or pericholecystic fluid. There is no positive sonographic Murphy sign. Common bile duct: Diameter: 5.3 mm Liver: The hepatic echotexture is normal. There is no focal mass nor ductal dilation. IMPRESSION: Normal limited right upper quadrant ultrasound examination. Electronically Signed   By: David  Martinique M.D.   On: 12/27/2016 12:20   Dg Chest Portable 1 View  Result Date: 12/27/2016 CLINICAL DATA:  Cough and shortness of breath EXAM: PORTABLE CHEST 1 VIEW COMPARISON:  Chest CT December 23, 2016 FINDINGS: There is no edema or consolidation. Heart is upper limits normal in size with pulmonary vascularity within normal limits. No adenopathy. No evident bone lesions. IMPRESSION: No edema or  consolidation. Electronically Signed   By: Lowella Grip III M.D.   On: 12/27/2016 09:31      Subjective: Pt decided to discharge today and not to stay another day, he says his ride is waiting. Says he will follow up as recommended and take meds as prescribed.    Discharge Exam: Vitals:   01/02/17 0535 01/02/17 1026  BP: 120/70 (!) 136/109  Pulse: 82 93  Resp: 17   Temp: 98.3 F (36.8 C)    Vitals:   01/01/17 1356 01/01/17 2214 01/02/17 0535 01/02/17 1026  BP: 124/64 (!) 141/75 120/70 (!) 136/109  Pulse: 80 86 82 93  Resp: 16 17 17    Temp: 98 F (36.7 C) 98.1 F (36.7 C) 98.3 F (36.8 C)   TempSrc: Oral Oral Oral   SpO2: 96% 95% 95%   Weight:      Height:       General exam: appears comfortable, NAD.   Respiratory system: Clear. No increased work of breathing. Cardiovascular system: S1 & S2 heard, RRR. No JVD, murmurs, gallops, clicks or pedal edema. Gastrointestinal system: Abdomen is nondistended, soft and nontender. Normal bowel sounds heard. Central nervous system: Alert and oriented. No focal neurological deficits. GU: foley in place Extremities: no CCE.   The results of significant diagnostics from this hospitalization (including imaging, microbiology, ancillary and laboratory) are listed below for reference.    Microbiology: Recent Results (from the past 240 hour(s))  Culture, blood (Routine x 2)     Status: Abnormal (Preliminary result)   Collection Time: 12/27/16  8:49 AM  Result Value Ref Range Status   Specimen Description BLOOD RIGHT ANTECUBITAL  Final   Special Requests   Final    BOTTLES DRAWN AEROBIC AND ANAEROBIC Blood Culture adequate volume   Culture  Setup Time   Final    GRAM POSITIVE COCCI GRAM NEGATIVE RODS ANAEROBIC BOTTLE ONLY CRITICAL RESULT CALLED TO, READ BACK BY AND VERIFIED WITH: Leonie Green PHARMD, AT 1111 12/29/16 BY D. VANHOOK    Culture (A)  Final    CLOSTRIDIUM CLOSTRIDIOFORME GRAM POSITIVE COCCI GRAM NEGATIVE RODS CRITICAL  RESULT CALLED TO, READ BACK BY AND VERIFIED WITH: R RUMBARGER,PHARMD AT 1127 12/31/16 BY L BENFIELD CONCERNING GROWTH ON CULTURE    Report Status PENDING  Incomplete  Blood Culture ID Panel (Reflexed)     Status: None   Collection Time: 12/27/16  8:49 AM  Result Value Ref Range Status   Enterococcus species NOT DETECTED NOT DETECTED Final   Listeria monocytogenes NOT DETECTED NOT DETECTED Final   Staphylococcus species NOT DETECTED NOT DETECTED Final   Staphylococcus aureus NOT DETECTED NOT DETECTED Final   Streptococcus species NOT DETECTED NOT DETECTED Final   Streptococcus agalactiae NOT DETECTED  NOT DETECTED Final   Streptococcus pneumoniae NOT DETECTED NOT DETECTED Final   Streptococcus pyogenes NOT DETECTED NOT DETECTED Final   Acinetobacter baumannii NOT DETECTED NOT DETECTED Final   Enterobacteriaceae species NOT DETECTED NOT DETECTED Final   Enterobacter cloacae complex NOT DETECTED NOT DETECTED Final   Escherichia coli NOT DETECTED NOT DETECTED Final   Klebsiella oxytoca NOT DETECTED NOT DETECTED Final   Klebsiella pneumoniae NOT DETECTED NOT DETECTED Final   Proteus species NOT DETECTED NOT DETECTED Final   Serratia marcescens NOT DETECTED NOT DETECTED Final   Haemophilus influenzae NOT DETECTED NOT DETECTED Final   Neisseria meningitidis NOT DETECTED NOT DETECTED Final   Pseudomonas aeruginosa NOT DETECTED NOT DETECTED Final   Candida albicans NOT DETECTED NOT DETECTED Final   Candida glabrata NOT DETECTED NOT DETECTED Final   Candida krusei NOT DETECTED NOT DETECTED Final   Candida parapsilosis NOT DETECTED NOT DETECTED Final   Candida tropicalis NOT DETECTED NOT DETECTED Final  Culture, blood (Routine x 2)     Status: Abnormal   Collection Time: 12/27/16  9:56 AM  Result Value Ref Range Status   Specimen Description BLOOD LEFT HAND  Final   Special Requests   Final    BOTTLES DRAWN AEROBIC AND ANAEROBIC Blood Culture adequate volume   Culture  Setup Time   Final     GRAM NEGATIVE RODS ANAEROBIC BOTTLE ONLY CRITICAL VALUE NOTED.  VALUE IS CONSISTENT WITH PREVIOUSLY REPORTED AND CALLED VALUE.    Culture CLOSTRIDIUM CLOSTRIDIOFORME (A)  Final   Report Status 01/01/2017 FINAL  Final  Urine culture     Status: Abnormal   Collection Time: 12/27/16 12:57 PM  Result Value Ref Range Status   Specimen Description URINE, RANDOM  Final   Special Requests NONE  Final   Culture >=100,000 COLONIES/mL ENTEROCOCCUS FAECALIS (A)  Final   Report Status 12/29/2016 FINAL  Final   Organism ID, Bacteria ENTEROCOCCUS FAECALIS (A)  Final      Susceptibility   Enterococcus faecalis - MIC*    AMPICILLIN <=2 SENSITIVE Sensitive     LEVOFLOXACIN 1 SENSITIVE Sensitive     NITROFURANTOIN <=16 SENSITIVE Sensitive     VANCOMYCIN 1 SENSITIVE Sensitive     * >=100,000 COLONIES/mL ENTEROCOCCUS FAECALIS  MRSA PCR Screening     Status: None   Collection Time: 12/28/16 12:17 AM  Result Value Ref Range Status   MRSA by PCR NEGATIVE NEGATIVE Final    Comment:        The GeneXpert MRSA Assay (FDA approved for NASAL specimens only), is one component of a comprehensive MRSA colonization surveillance program. It is not intended to diagnose MRSA infection nor to guide or monitor treatment for MRSA infections.      Labs: BNP (last 3 results) No results for input(s): BNP in the last 8760 hours. Basic Metabolic Panel:  Recent Labs Lab 12/29/16 0602 12/30/16 0452 12/31/16 0432 01/01/17 0701 01/02/17 0412  NA 136 134* 131* 133* 134*  K 4.0 3.8 4.0 4.3 4.3  CL 105 102 98* 99* 99*  CO2 23 27 27 26 26   GLUCOSE 115* 137* 129* 117* 103*  BUN 13 10 10 13 17   CREATININE 0.98 0.94 1.02 1.10 1.14  CALCIUM 8.0* 8.0* 8.2* 8.5* 8.1*   Liver Function Tests:  Recent Labs Lab 12/28/16 0137 12/29/16 0602 12/30/16 0452 12/31/16 0432 01/02/17 0412  AST 72* 51* 38 49* 65*  ALT 67* 52 44 51 64*  ALKPHOS 106 118 106 110 80  BILITOT  0.8 1.5* 1.3* 1.4* 1.1  PROT 5.7* 5.9* 6.3*  6.5 6.8  ALBUMIN 2.2* 2.1* 2.2* 2.2* 2.4*   No results for input(s): LIPASE, AMYLASE in the last 168 hours. No results for input(s): AMMONIA in the last 168 hours. CBC:  Recent Labs Lab 12/29/16 0602 12/30/16 0452 12/31/16 0432 01/01/17 0701 01/02/17 0412  WBC 18.8* 20.1* 22.8* 20.4* 10.7*  NEUTROABS 16.0* 15.9* 17.1* 15.7* 6.4  HGB 11.5* 12.0* 11.5* 12.5* 11.5*  HCT 32.7* 33.4* 32.9* 35.2* 33.7*  MCV 63.5* 63.4* 63.5* 63.9* 65.1*  PLT 95* 117* 144* 193 253   Cardiac Enzymes: No results for input(s): CKTOTAL, CKMB, CKMBINDEX, TROPONINI in the last 168 hours. BNP: Invalid input(s): POCBNP CBG: No results for input(s): GLUCAP in the last 168 hours. D-Dimer No results for input(s): DDIMER in the last 72 hours. Hgb A1c No results for input(s): HGBA1C in the last 72 hours. Lipid Profile No results for input(s): CHOL, HDL, LDLCALC, TRIG, CHOLHDL, LDLDIRECT in the last 72 hours. Thyroid function studies No results for input(s): TSH, T4TOTAL, T3FREE, THYROIDAB in the last 72 hours.  Invalid input(s): FREET3 Anemia work up No results for input(s): VITAMINB12, FOLATE, FERRITIN, TIBC, IRON, RETICCTPCT in the last 72 hours. Urinalysis    Component Value Date/Time   COLORURINE YELLOW 12/27/2016 1256   APPEARANCEUR CLEAR 12/27/2016 1256   LABSPEC 1.005 12/27/2016 1256   PHURINE 7.0 12/27/2016 1256   GLUCOSEU NEGATIVE 12/27/2016 1256   HGBUR MODERATE (A) 12/27/2016 1256   BILIRUBINUR NEGATIVE 12/27/2016 1256   KETONESUR NEGATIVE 12/27/2016 1256   PROTEINUR NEGATIVE 12/27/2016 1256   UROBILINOGEN 0.2 11/30/2014 1942   NITRITE NEGATIVE 12/27/2016 1256   LEUKOCYTESUR NEGATIVE 12/27/2016 1256   Sepsis Labs Invalid input(s): PROCALCITONIN,  WBC,  LACTICIDVEN Microbiology Recent Results (from the past 240 hour(s))  Culture, blood (Routine x 2)     Status: Abnormal (Preliminary result)   Collection Time: 12/27/16  8:49 AM  Result Value Ref Range Status   Specimen Description  BLOOD RIGHT ANTECUBITAL  Final   Special Requests   Final    BOTTLES DRAWN AEROBIC AND ANAEROBIC Blood Culture adequate volume   Culture  Setup Time   Final    GRAM POSITIVE COCCI GRAM NEGATIVE RODS ANAEROBIC BOTTLE ONLY CRITICAL RESULT CALLED TO, READ BACK BY AND VERIFIED WITH: Leonie Green PHARMD, AT 1111 12/29/16 BY D. VANHOOK    Culture (A)  Final    CLOSTRIDIUM CLOSTRIDIOFORME GRAM POSITIVE COCCI GRAM NEGATIVE RODS CRITICAL RESULT CALLED TO, READ BACK BY AND VERIFIED WITH: R RUMBARGER,PHARMD AT 1127 12/31/16 BY L BENFIELD CONCERNING GROWTH ON CULTURE    Report Status PENDING  Incomplete  Blood Culture ID Panel (Reflexed)     Status: None   Collection Time: 12/27/16  8:49 AM  Result Value Ref Range Status   Enterococcus species NOT DETECTED NOT DETECTED Final   Listeria monocytogenes NOT DETECTED NOT DETECTED Final   Staphylococcus species NOT DETECTED NOT DETECTED Final   Staphylococcus aureus NOT DETECTED NOT DETECTED Final   Streptococcus species NOT DETECTED NOT DETECTED Final   Streptococcus agalactiae NOT DETECTED NOT DETECTED Final   Streptococcus pneumoniae NOT DETECTED NOT DETECTED Final   Streptococcus pyogenes NOT DETECTED NOT DETECTED Final   Acinetobacter baumannii NOT DETECTED NOT DETECTED Final   Enterobacteriaceae species NOT DETECTED NOT DETECTED Final   Enterobacter cloacae complex NOT DETECTED NOT DETECTED Final   Escherichia coli NOT DETECTED NOT DETECTED Final   Klebsiella oxytoca NOT DETECTED NOT DETECTED Final  Klebsiella pneumoniae NOT DETECTED NOT DETECTED Final   Proteus species NOT DETECTED NOT DETECTED Final   Serratia marcescens NOT DETECTED NOT DETECTED Final   Haemophilus influenzae NOT DETECTED NOT DETECTED Final   Neisseria meningitidis NOT DETECTED NOT DETECTED Final   Pseudomonas aeruginosa NOT DETECTED NOT DETECTED Final   Candida albicans NOT DETECTED NOT DETECTED Final   Candida glabrata NOT DETECTED NOT DETECTED Final   Candida krusei NOT  DETECTED NOT DETECTED Final   Candida parapsilosis NOT DETECTED NOT DETECTED Final   Candida tropicalis NOT DETECTED NOT DETECTED Final  Culture, blood (Routine x 2)     Status: Abnormal   Collection Time: 12/27/16  9:56 AM  Result Value Ref Range Status   Specimen Description BLOOD LEFT HAND  Final   Special Requests   Final    BOTTLES DRAWN AEROBIC AND ANAEROBIC Blood Culture adequate volume   Culture  Setup Time   Final    GRAM NEGATIVE RODS ANAEROBIC BOTTLE ONLY CRITICAL VALUE NOTED.  VALUE IS CONSISTENT WITH PREVIOUSLY REPORTED AND CALLED VALUE.    Culture CLOSTRIDIUM CLOSTRIDIOFORME (A)  Final   Report Status 01/01/2017 FINAL  Final  Urine culture     Status: Abnormal   Collection Time: 12/27/16 12:57 PM  Result Value Ref Range Status   Specimen Description URINE, RANDOM  Final   Special Requests NONE  Final   Culture >=100,000 COLONIES/mL ENTEROCOCCUS FAECALIS (A)  Final   Report Status 12/29/2016 FINAL  Final   Organism ID, Bacteria ENTEROCOCCUS FAECALIS (A)  Final      Susceptibility   Enterococcus faecalis - MIC*    AMPICILLIN <=2 SENSITIVE Sensitive     LEVOFLOXACIN 1 SENSITIVE Sensitive     NITROFURANTOIN <=16 SENSITIVE Sensitive     VANCOMYCIN 1 SENSITIVE Sensitive     * >=100,000 COLONIES/mL ENTEROCOCCUS FAECALIS  MRSA PCR Screening     Status: None   Collection Time: 12/28/16 12:17 AM  Result Value Ref Range Status   MRSA by PCR NEGATIVE NEGATIVE Final    Comment:        The GeneXpert MRSA Assay (FDA approved for NASAL specimens only), is one component of a comprehensive MRSA colonization surveillance program. It is not intended to diagnose MRSA infection nor to guide or monitor treatment for MRSA infections.    Time coordinating discharge: 35 mins  SIGNED:  Irwin Brakeman, MD  [facility] 01/02/2017, 11:36 AM Pager [phone]  If 7PM-7AM, please contact night-coverage [email or web address] Password [email or web address]

## 2017-01-02 NOTE — Progress Notes (Signed)
Subjective/Chief Complaint:  1 -  Metastatic Prostate Cancer - PSA 400 2018. Pelvic node biopsy 10/2016 inconclusive. TRUS BX 11/2016 333 mL with 12/12 cores upt to 90% Gleason 8 adenocarcinoma. Alk PHos and Cr normal 2018.   2 - Urinary Retention / Massive Prostate - urinary retention, catheter dependant since 2018. Prostate Vol 365m by CT ellipsoid calculation 2018. Likely metastatic prostate cancer in massive prostate as per above.   3 - Fevers After Prostate Biopsy / Likely Prostate Abscess - fevers, leukocytosis, tachycardia 12/2016 after prostate biopsy 12/19/16. BCX clostridium, UCX enterococcus sens amp, levaquin. Placed on empirc Vanc + Cefepime narrowed to Zosyn. He received Bactrim / GNorva Karvonenat time of biopsy.CT 8/2 with liklely mid-apical gland abscess.   Today "John Shields is improving. Afebrile for over 24 hours and leukocytosis now trending down. Still some malaise but much improved.   Objective: Vital signs in last 24 hours: Temp:  [98 F (36.7 C)-98.3 F (36.8 C)] 98.3 F (36.8 C) (08/07 0535) Pulse Rate:  [80-86] 82 (08/07 0535) Resp:  [16-17] 17 (08/07 0535) BP: (120-146)/(64-85) 120/70 (08/07 0535) SpO2:  [95 %-96 %] 95 % (08/07 0535) Last BM Date: 01/01/17 (small bits)  Intake/Output from previous day: 08/06 0701 - 08/07 0700 In: 1080 [P.O.:880; IV Piggyback:200] Out: 850 [Urine:850] Intake/Output this shift: Total I/O In: 540 [P.O.:440; IV Piggyback:100] Out: 850 [Urine:850]  General appearance: alert, cooperative, appears stated age and at baseline Eyes: negative Nose: Nares normal. Septum midline. Mucosa normal. No drainage or sinus tenderness. Throat: lips, mucosa, and tongue normal; teeth and gums normal Neck: supple, symmetrical, trachea midline Back: symmetric, no curvature. ROM normal. No CVA tenderness. Resp: non-labored on room air Cardio: Nl rate GI: soft, non-tender; bowel sounds normal; no masses,  no organomegaly Male genitalia: foley in place  with yellow urine. DRE with large prostate w/o fluctuence.  Extremities: extremities normal, atraumatic, no cyanosis or edema Pulses: 2+ and symmetric Lymph nodes: Cervical, supraclavicular, and axillary nodes normal. Neurologic: Grossly normal  Lab Results:   Recent Labs  01/01/17 0701 01/02/17 0412  WBC 20.4* 10.7*  HGB 12.5* 11.5*  HCT 35.2* 33.7*  PLT 193 253   BMET  Recent Labs  01/01/17 0701 01/02/17 0412  NA 133* 134*  K 4.3 4.3  CL 99* 99*  CO2 26 26  GLUCOSE 117* 103*  BUN 13 17  CREATININE 1.10 1.14  CALCIUM 8.5* 8.1*   PT/INR No results for input(s): LABPROT, INR in the last 72 hours. ABG No results for input(s): PHART, HCO3 in the last 72 hours.  Invalid input(s): PCO2, PO2  Studies/Results: No results found.  Anti-infectives: Anti-infectives    Start     Dose/Rate Route Frequency Ordered Stop   12/31/16 1400  piperacillin-tazobactam (ZOSYN) IVPB 3.375 g     3.375 g 12.5 mL/hr over 240 Minutes Intravenous Every 8 hours 12/31/16 1253     12/31/16 1000  cefTRIAXone (ROCEPHIN) 2 g in dextrose 5 % 50 mL IVPB  Status:  Discontinued     2 g 100 mL/hr over 30 Minutes Intravenous Every 24 hours 12/31/16 0939 12/31/16 1140   12/30/16 1130  levofloxacin (LEVAQUIN) IVPB 750 mg  Status:  Discontinued     750 mg 100 mL/hr over 90 Minutes Intravenous Every 24 hours 12/30/16 1057 12/31/16 1140   12/28/16 2000  vancomycin (VANCOCIN) 1,250 mg in sodium chloride 0.9 % 250 mL IVPB  Status:  Discontinued     1,250 mg 166.7 mL/hr over 90 Minutes Intravenous Every  12 hours 12/28/16 0731 12/30/16 1017   12/28/16 1000  cefTRIAXone (ROCEPHIN) 1 g in dextrose 5 % 50 mL IVPB  Status:  Discontinued     1 g 100 mL/hr over 30 Minutes Intravenous Every 24 hours 12/27/16 0926 12/28/16 0722   12/28/16 0815  ceFEPIme (MAXIPIME) 1 g in dextrose 5 % 50 mL IVPB     1 g 100 mL/hr over 30 Minutes Intravenous Every 8 hours 12/28/16 0722 12/30/16 0531   12/28/16 0800  vancomycin  (VANCOCIN) 1,500 mg in sodium chloride 0.9 % 500 mL IVPB     1,500 mg 250 mL/hr over 120 Minutes Intravenous  Once 12/28/16 0731 12/28/16 1111   12/27/16 0930  cefTRIAXone (ROCEPHIN) 2 g in dextrose 5 % 50 mL IVPB     2 g 100 mL/hr over 30 Minutes Intravenous  Once 12/27/16 5883 12/27/16 1239      Assessment/Plan:  1 -  Metastatic Prostate Cancer - This man will require androgen deprivation, preferrably with bilateral orchiectomy in elective setting given record of poor compliance. He is amenable.  We will discuss this and timing further at his 8/27 appt with me in office.   2 - Urinary Retention / Massive Prostate - Continue foley.  This is due to massive prostate cancer.  He may be tube dependant going forward.   3 - Fevers After Prostate Biopsy / Prostate Abscess- Improving clinically on medical therapy. I do not feel he will require operative drainage which is fortunate. In terms of GU infection, I feel reasonable to transition to PO Levaquin daily for 4 more weeks (prostate infections require very long course) which he can take at home.  He has GU follow up with me in 2-3 weeks and is aware of appointment.  Continue to greatly appreciate medical team comangement.   Please call me directly with questions.    [facility], [place] 01/02/2017

## 2017-01-04 LAB — CULTURE, BLOOD (ROUTINE X 2): SPECIAL REQUESTS: ADEQUATE

## 2017-01-04 NOTE — ED Provider Notes (Signed)
[facility] DEPT Provider Note   CSN: [id number] Arrival date & time: 12/27/16  0831     History   Chief Complaint Chief Complaint  Patient presents with  . Shortness of Breath  . Fever    HPI John Shields is a 71 y.o. male.  HPI 71 y.o.malewith medical history significant for HTN, BPH, Urinary retention with self catheterization (not changed for about 6 months), presenting with increasing shortness of breath, fever upto 102.3, diaphoresis, malaise/generalized weakness. Pt reports that he has been feeling unwell for few days now, and was seen in the ER recently, but he has continued to feel unwell and now he is having sweats and fevers. Pt reports that he has a dry cough, fever. Pt has a foley catheter in place that hasn't been changed recently. Denies abdominal pain, but he has mild nausea and the appetite is normal.  Past Medical History:  Diagnosis Date  . BPH (benign prostatic hyperplasia)   . Hypertension   . Urinary retention    pt reports this 2 years ago    Patient Active Problem List   Diagnosis Date Noted  . BPH (benign prostatic hyperplasia) 12/27/2016  . Renal mass 12/27/2016  . Thrombocytopenia ([place]) 12/27/2016  . Sepsis ([place]) 12/27/2016  . Elevated LFTs   . Lactic acidosis   . AKI (acute kidney injury) ([place])   . Urinary retention 06/07/2016  . Constipation 06/07/2016  . Acute renal failure (ARF) ([place]) 06/07/2016  . Essential hypertension 06/07/2016  . Hyperglycemia 06/07/2016    Past Surgical History:  Procedure Laterality Date  . NO PAST SURGERIES         Home Medications    Prior to Admission medications   Medication Sig Start Date End Date Taking? Authorizing Provider  amLODipine (NORVASC) 5 MG tablet Take 1 tablet (5 mg total) by mouth daily. 01/03/17 02/02/17  Johnson, Clanford L, MD  levofloxacin (LEVAQUIN) 500 MG tablet Take 1 tablet (500 mg total) by mouth daily. 01/02/17 02/01/17  Johnson, Clanford L, MD  lisinopril (PRINIVIL,ZESTRIL) 20  MG tablet Take 1 tablet (20 mg total) by mouth daily. 01/03/17 02/02/17  Johnson, Clanford L, MD  metoprolol succinate (TOPROL-XL) 25 MG 24 hr tablet Take 1 tablet (25 mg total) by mouth daily. 01/03/17 02/02/17  Johnson, Clanford L, MD  polyethylene glycol (MIRALAX / GLYCOLAX) packet Take 17 g by mouth daily. Patient not taking: Reported on 12/27/2016 06/10/16   Modena Jansky, MD  tamsulosin (FLOMAX) 0.4 MG CAPS capsule Take 1 capsule (0.4 mg total) by mouth daily. Patient not taking: Reported on 12/27/2016 06/20/16   Horton, Barbette Hair, MD    Family History Family History  Problem Relation Age of Onset  . Heart attack Father   . Heart attack Brother     Social History Social History  Substance Use Topics  . Smoking status: Never Smoker  . Smokeless tobacco: Never Used  . Alcohol use No     Allergies   Patient has no known allergies.   Review of Systems Review of Systems  Constitutional: Positive for appetite change, chills, diaphoresis, fatigue and fever.  Respiratory: Negative for shortness of breath.   Cardiovascular: Negative for chest pain.  Gastrointestinal: Positive for nausea. Negative for abdominal pain.  Allergic/Immunologic: Positive for immunocompromised state.  All other systems reviewed and are negative.    Physical Exam Updated Vital Signs BP (!) 136/109   Pulse 93   Temp 98.3 F (36.8 C) (Oral)   Resp 17   Ht  6' (1.829 m)   Wt 95.9 kg (211 lb 6.4 oz)   SpO2 95%   BMI 28.67 kg/m   Physical Exam  Constitutional: He is oriented to person, place, and time. He appears well-developed.  HENT:  Head: Atraumatic.  Neck: Neck supple.  Cardiovascular: Normal rate.   Pulmonary/Chest: Effort normal.  Neurological: He is alert and oriented to person, place, and time.  Skin: Skin is warm.  Nursing note and vitals reviewed.    ED Treatments / Results  Labs (all labs ordered are listed, but only abnormal results are displayed) Labs Reviewed  CULTURE, BLOOD  (ROUTINE X 2) - Abnormal; Notable for the following:       Result Value   Culture   (*)    Value: CLOSTRIDIUM CLOSTRIDIOFORME GRAM POSITIVE COCCI NOT RECOVERED IN CULTURE. CRITICAL RESULT CALLED TO, READ BACK BY AND VERIFIED WITH: R RUMBARGER,PHARMD AT 1127 12/31/16 BY L BENFIELD CONCERNING GROWTH ON CULTURE    All other components within normal limits  CULTURE, BLOOD (ROUTINE X 2) - Abnormal; Notable for the following:    Culture CLOSTRIDIUM CLOSTRIDIOFORME (*)    All other components within normal limits  URINE CULTURE - Abnormal; Notable for the following:    Culture >=100,000 COLONIES/mL ENTEROCOCCUS FAECALIS (*)    Organism ID, Bacteria ENTEROCOCCUS FAECALIS (*)    All other components within normal limits  COMPREHENSIVE METABOLIC PANEL - Abnormal; Notable for the following:    Glucose, Bld 126 (*)    Calcium 8.3 (*)    Total Protein 6.1 (*)    Albumin 2.5 (*)    AST 75 (*)    ALT 64 (*)    Alkaline Phosphatase 323 (*)    Total Bilirubin 1.9 (*)    All other components within normal limits  CBC WITH DIFFERENTIAL/PLATELET - Abnormal; Notable for the following:    Hemoglobin 12.9 (*)    HCT 36.7 (*)    MCV 64.7 (*)    MCH 22.8 (*)    Platelets 87 (*)    Neutro Abs 8.0 (*)    Lymphs Abs 0.5 (*)    All other components within normal limits  URINALYSIS, ROUTINE W REFLEX MICROSCOPIC - Abnormal; Notable for the following:    Hgb urine dipstick MODERATE (*)    All other components within normal limits  HEMOGLOBIN A1C - Abnormal; Notable for the following:    Hgb A1c MFr Bld 6.1 (*)    All other components within normal limits  COMPREHENSIVE METABOLIC PANEL - Abnormal; Notable for the following:    Glucose, Bld 140 (*)    Calcium 7.9 (*)    Total Protein 5.7 (*)    Albumin 2.2 (*)    AST 72 (*)    ALT 67 (*)    All other components within normal limits  CBC - Abnormal; Notable for the following:    WBC 21.3 (*)    Hemoglobin 10.5 (*)    HCT 30.3 (*)    MCV 64.6 (*)     MCH 22.4 (*)    Platelets 97 (*)    All other components within normal limits  CBC WITH DIFFERENTIAL/PLATELET - Abnormal; Notable for the following:    WBC 18.8 (*)    Hemoglobin 11.5 (*)    HCT 32.7 (*)    MCV 63.5 (*)    MCH 22.3 (*)    Platelets 95 (*)    Neutro Abs 16.0 (*)    Monocytes Absolute 1.3 (*)  All other components within normal limits  COMPREHENSIVE METABOLIC PANEL - Abnormal; Notable for the following:    Glucose, Bld 115 (*)    Calcium 8.0 (*)    Total Protein 5.9 (*)    Albumin 2.1 (*)    AST 51 (*)    Total Bilirubin 1.5 (*)    All other components within normal limits  CBC WITH DIFFERENTIAL/PLATELET - Abnormal; Notable for the following:    WBC 20.1 (*)    Hemoglobin 12.0 (*)    HCT 33.4 (*)    MCV 63.4 (*)    MCH 22.8 (*)    Platelets 117 (*)    Neutro Abs 15.9 (*)    Monocytes Absolute 1.6 (*)    All other components within normal limits  COMPREHENSIVE METABOLIC PANEL - Abnormal; Notable for the following:    Sodium 134 (*)    Glucose, Bld 137 (*)    Calcium 8.0 (*)    Total Protein 6.3 (*)    Albumin 2.2 (*)    Total Bilirubin 1.3 (*)    All other components within normal limits  CBC WITH DIFFERENTIAL/PLATELET - Abnormal; Notable for the following:    WBC 22.8 (*)    Hemoglobin 11.5 (*)    HCT 32.9 (*)    MCV 63.5 (*)    MCH 22.2 (*)    Platelets 144 (*)    Neutro Abs 17.1 (*)    Monocytes Absolute 2.5 (*)    All other components within normal limits  COMPREHENSIVE METABOLIC PANEL - Abnormal; Notable for the following:    Sodium 131 (*)    Chloride 98 (*)    Glucose, Bld 129 (*)    Calcium 8.2 (*)    Albumin 2.2 (*)    AST 49 (*)    Total Bilirubin 1.4 (*)    All other components within normal limits  CBC WITH DIFFERENTIAL/PLATELET - Abnormal; Notable for the following:    WBC 20.4 (*)    Hemoglobin 12.5 (*)    HCT 35.2 (*)    MCV 63.9 (*)    MCH 22.7 (*)    Neutro Abs 15.7 (*)    Monocytes Absolute 2.0 (*)    All other  components within normal limits  BASIC METABOLIC PANEL - Abnormal; Notable for the following:    Sodium 133 (*)    Chloride 99 (*)    Glucose, Bld 117 (*)    Calcium 8.5 (*)    All other components within normal limits  CBC WITH DIFFERENTIAL/PLATELET - Abnormal; Notable for the following:    WBC 10.7 (*)    Hemoglobin 11.5 (*)    HCT 33.7 (*)    MCV 65.1 (*)    MCH 22.2 (*)    Monocytes Absolute 1.2 (*)    All other components within normal limits  COMPREHENSIVE METABOLIC PANEL - Abnormal; Notable for the following:    Sodium 134 (*)    Chloride 99 (*)    Glucose, Bld 103 (*)    Calcium 8.1 (*)    Albumin 2.4 (*)    AST 65 (*)    ALT 64 (*)    All other components within normal limits  I-STAT CG4 LACTIC ACID, ED - Abnormal; Notable for the following:    Lactic Acid, Venous 2.93 (*)    All other components within normal limits  I-STAT CG4 LACTIC ACID, ED - Abnormal; Notable for the following:    Lactic Acid, Venous 3.72 (*)  All other components within normal limits  MRSA PCR SCREENING  BLOOD CULTURE ID PANEL (REFLEXED)  PROTIME-INR  LACTIC ACID, PLASMA  LACTIC ACID, PLASMA  INFLUENZA PANEL BY PCR (TYPE A & B)  PROCALCITONIN  PROTIME-INR  SAVE SMEAR  PROCALCITONIN  OSMOLALITY    EKG  EKG Interpretation  Date/Time:  Wednesday December 27 2016 08:40:42 EDT Ventricular Rate:  124 PR Interval:    QRS Duration: 138 QT Interval:  318 QTC Calculation: 457 R Axis:   -63 Text Interpretation:  Sinus tachycardia Left bundle branch block No acute changes No significant change since last tracing Confirmed by Varney Biles ([id number]) on 12/27/2016 8:51:39 AM       Radiology No results found.  Procedures Procedures (including critical care time)  Medications Ordered in ED Medications  sodium chloride 0.9 % bolus 1,000 mL (0 mLs Intravenous Stopped 12/27/16 1239)    And  sodium chloride 0.9 % bolus 1,000 mL (0 mLs Intravenous Stopped 12/27/16 1238)  cefTRIAXone (ROCEPHIN)  2 g in dextrose 5 % 50 mL IVPB (0 g Intravenous Stopped 12/27/16 1239)  ibuprofen (ADVIL,MOTRIN) tablet 600 mg (600 mg Oral Given 12/27/16 1255)  sodium chloride 0.9 % bolus 1,000 mL (1,000 mLs Intravenous New Bag/Given 12/27/16 1420)  pneumococcal 23 valent vaccine (PNU-IMMUNE) injection 0.5 mL (0.5 mLs Intramuscular Given 01/01/17 1301)  ceFEPIme (MAXIPIME) 1 g in dextrose 5 % 50 mL IVPB (1 g Intravenous New Bag/Given 12/30/16 0501)  vancomycin (VANCOCIN) 1,500 mg in sodium chloride 0.9 % 500 mL IVPB (0 mg Intravenous Stopped 12/28/16 1111)  iopamidol (ISOVUE-300) 61 % injection (100 mLs Intravenous Contrast Given 12/28/16 1130)     Initial Impression / Assessment and Plan / ED Course  I have reviewed the triage vital signs and the nursing notes.  Pertinent labs & imaging results that were available during my care of the patient were reviewed by me and considered in my medical decision making (see chart for details).     Pt comes in with cc of weakness. Pt had a recent stent placement. Pt also c/o generalized weakness, fevers, chills. Labs are impressive only for elevated LFTs. Abd is soft. [place] ordered. ? If there is paraneoplastic process going on. Additionally, pt has a foley cath in place which is a nidus for infection. We are concerned of possible sepsis - we will activate code sepsis and start ceftriaxone.  Final Clinical Impressions(s) / ED Diagnoses   Final diagnoses:  Bandemia  Elevated LFTs  Lactic acidosis    New Prescriptions Discharge Medication List as of 01/02/2017  1:15 PM    START taking these medications   Details  amLODipine (NORVASC) 5 MG tablet Take 1 tablet (5 mg total) by mouth daily., Starting Wed 01/03/2017, Until Fri 02/02/2017, Normal    levofloxacin (LEVAQUIN) 500 MG tablet Take 1 tablet (500 mg total) by mouth daily., Starting Tue 01/02/2017, Until Thu 02/01/2017, Normal    lisinopril (PRINIVIL,ZESTRIL) 20 MG tablet Take 1 tablet (20 mg total) by mouth daily., Starting Wed  01/03/2017, Until Fri 02/02/2017, Normal    metoprolol succinate (TOPROL-XL) 25 MG 24 hr tablet Take 1 tablet (25 mg total) by mouth daily., Starting Wed 01/03/2017, Until Fri 02/02/2017, Normal         Varney Biles, MD 01/04/17 1321

## 2017-01-10 ENCOUNTER — Ambulatory Visit: Payer: Medicare Other | Admitting: Adult Health

## 2017-01-22 DIAGNOSIS — C775 Secondary and unspecified malignant neoplasm of intrapelvic lymph nodes: Secondary | ICD-10-CM | POA: Diagnosis not present

## 2017-01-22 DIAGNOSIS — C641 Malignant neoplasm of right kidney, except renal pelvis: Secondary | ICD-10-CM | POA: Diagnosis not present

## 2017-01-22 DIAGNOSIS — R338 Other retention of urine: Secondary | ICD-10-CM | POA: Diagnosis not present

## 2017-01-22 DIAGNOSIS — C61 Malignant neoplasm of prostate: Secondary | ICD-10-CM | POA: Diagnosis not present

## 2017-01-23 ENCOUNTER — Other Ambulatory Visit: Payer: Self-pay | Admitting: Urology

## 2017-01-25 ENCOUNTER — Encounter (HOSPITAL_BASED_OUTPATIENT_CLINIC_OR_DEPARTMENT_OTHER): Payer: Self-pay | Admitting: *Deleted

## 2017-01-30 NOTE — Progress Notes (Signed)
UNABLE TO REACH PT VIA PHONE.  LEFT MESSAGE VIA PHONE TO ARRIVE AT 1100 AND BE NPO AFTER MN WITH EXCEPTION TAKE METOPROLOL AM DOS W/ SIPS OF WATER (PER CURRENT MED. LIST IN EPIC).   PT NEEDS HX DONE IN EPIC.  CURRENT LAB RESULTS AND EKG IN CHART AND EPIC.

## 2017-01-31 ENCOUNTER — Encounter (HOSPITAL_BASED_OUTPATIENT_CLINIC_OR_DEPARTMENT_OTHER): Payer: Self-pay | Admitting: Anesthesiology

## 2017-01-31 ENCOUNTER — Ambulatory Visit (HOSPITAL_BASED_OUTPATIENT_CLINIC_OR_DEPARTMENT_OTHER): Payer: Medicare Other | Admitting: Anesthesiology

## 2017-01-31 ENCOUNTER — Encounter (HOSPITAL_BASED_OUTPATIENT_CLINIC_OR_DEPARTMENT_OTHER)
Admission: RE | Disposition: A | Discharge status: Home / Self Care | Payer: Self-pay | Source: Ambulatory Visit | Attending: Urology

## 2017-01-31 ENCOUNTER — Ambulatory Visit (HOSPITAL_BASED_OUTPATIENT_CLINIC_OR_DEPARTMENT_OTHER)
Admission: RE | Admit: 2017-01-31 | Discharge: 2017-01-31 | Disposition: A | Discharge status: Home / Self Care | Payer: Medicare Other | Source: Ambulatory Visit | Attending: Urology | Admitting: Urology

## 2017-01-31 DIAGNOSIS — I10 Essential (primary) hypertension: Secondary | ICD-10-CM | POA: Diagnosis not present

## 2017-01-31 DIAGNOSIS — R338 Other retention of urine: Secondary | ICD-10-CM | POA: Diagnosis not present

## 2017-01-31 DIAGNOSIS — Z79899 Other long term (current) drug therapy: Secondary | ICD-10-CM | POA: Diagnosis not present

## 2017-01-31 DIAGNOSIS — C7982 Secondary malignant neoplasm of genital organs: Secondary | ICD-10-CM | POA: Diagnosis not present

## 2017-01-31 DIAGNOSIS — N4611 Organic oligospermia: Secondary | ICD-10-CM | POA: Diagnosis not present

## 2017-01-31 DIAGNOSIS — C61 Malignant neoplasm of prostate: Secondary | ICD-10-CM | POA: Diagnosis not present

## 2017-01-31 DIAGNOSIS — C641 Malignant neoplasm of right kidney, except renal pelvis: Secondary | ICD-10-CM | POA: Diagnosis not present

## 2017-01-31 DIAGNOSIS — N4601 Organic azoospermia: Secondary | ICD-10-CM | POA: Diagnosis not present

## 2017-01-31 DIAGNOSIS — N401 Enlarged prostate with lower urinary tract symptoms: Secondary | ICD-10-CM | POA: Diagnosis not present

## 2017-01-31 DIAGNOSIS — N179 Acute kidney failure, unspecified: Secondary | ICD-10-CM | POA: Diagnosis not present

## 2017-01-31 HISTORY — DX: Left bundle-branch block, unspecified: I44.7

## 2017-01-31 HISTORY — DX: Malignant neoplasm of prostate: C61

## 2017-01-31 HISTORY — DX: Retention of urine, unspecified: R33.9

## 2017-01-31 HISTORY — DX: Malignant neoplasm of right kidney, except renal pelvis: C64.1

## 2017-01-31 HISTORY — DX: Personal history of other infectious and parasitic diseases: Z86.19

## 2017-01-31 HISTORY — DX: Presence of other specified devices: Z97.8

## 2017-01-31 HISTORY — DX: Thrombocytopenia, unspecified: D69.6

## 2017-01-31 HISTORY — DX: Presence of urogenital implants: Z96.0

## 2017-01-31 HISTORY — PX: ORCHIECTOMY: SHX2116

## 2017-01-31 SURGERY — ORCHIECTOMY
Anesthesia: General | Laterality: Bilateral

## 2017-01-31 MED ORDER — NALOXONE HCL 0.4 MG/ML IJ SOLN
INTRAMUSCULAR | Status: AC
  Filled 2017-01-31: qty 1

## 2017-01-31 MED ORDER — BUPIVACAINE HCL (PF) 0.25 % IJ SOLN
INTRAMUSCULAR | Status: AC
  Filled 2017-01-31: qty 30

## 2017-01-31 MED ORDER — FENTANYL CITRATE (PF) 100 MCG/2ML IJ SOLN
INTRAMUSCULAR | Status: AC
  Filled 2017-01-31: qty 2

## 2017-01-31 MED ORDER — MIDAZOLAM HCL 5 MG/5ML IJ SOLN
INTRAMUSCULAR | Status: DC | PRN
  Administered 2017-01-31: 2 mg via INTRAVENOUS

## 2017-01-31 MED ORDER — PROPOFOL 10 MG/ML IV BOLUS
INTRAVENOUS | Status: AC
  Filled 2017-01-31: qty 40

## 2017-01-31 MED ORDER — NALOXONE HCL 0.4 MG/ML IJ SOLN
INTRAMUSCULAR | Status: DC | PRN
  Administered 2017-01-31: 80 ug via INTRAVENOUS

## 2017-01-31 MED ORDER — LIDOCAINE 2% (20 MG/ML) 5 ML SYRINGE
INTRAMUSCULAR | Status: AC
  Filled 2017-01-31: qty 5

## 2017-01-31 MED ORDER — ONDANSETRON HCL 4 MG/2ML IJ SOLN
INTRAMUSCULAR | Status: DC | PRN
  Administered 2017-01-31: 4 mg via INTRAVENOUS

## 2017-01-31 MED ORDER — PROPOFOL 10 MG/ML IV BOLUS
INTRAVENOUS | Status: DC | PRN
  Administered 2017-01-31: 200 mg via INTRAVENOUS

## 2017-01-31 MED ORDER — CLINDAMYCIN PHOSPHATE 900 MG/50ML IV SOLN
INTRAVENOUS | Status: AC
  Filled 2017-01-31: qty 50

## 2017-01-31 MED ORDER — LIDOCAINE 2% (20 MG/ML) 5 ML SYRINGE
INTRAMUSCULAR | Status: DC | PRN
Start: 2017-01-31 — End: 2017-01-31
  Administered 2017-01-31: 80 mg via INTRAVENOUS

## 2017-01-31 MED ORDER — PROMETHAZINE HCL 25 MG/ML IJ SOLN
6.2500 mg | INTRAMUSCULAR | Status: DC | PRN
  Filled 2017-01-31: qty 1

## 2017-01-31 MED ORDER — DEXAMETHASONE SODIUM PHOSPHATE 4 MG/ML IJ SOLN
INTRAMUSCULAR | Status: DC | PRN
  Administered 2017-01-31: 10 mg via INTRAVENOUS

## 2017-01-31 MED ORDER — TRAMADOL HCL 50 MG PO TABS: 50.0000 mg | ORAL_TABLET | Freq: Four times a day (QID) | ORAL | 0 refills | Status: DC | PRN

## 2017-01-31 MED ORDER — LACTATED RINGERS IV SOLN
INTRAVENOUS | Status: DC
  Administered 2017-01-31: 12:00:00 via INTRAVENOUS
  Filled 2017-01-31: qty 1000

## 2017-01-31 MED ORDER — ONDANSETRON HCL 4 MG/2ML IJ SOLN
INTRAMUSCULAR | Status: AC
  Filled 2017-01-31: qty 2

## 2017-01-31 MED ORDER — FENTANYL CITRATE (PF) 100 MCG/2ML IJ SOLN
INTRAMUSCULAR | Status: DC | PRN
  Administered 2017-01-31: 100 ug via INTRAVENOUS

## 2017-01-31 MED ORDER — BUPIVACAINE HCL (PF) 0.25 % IJ SOLN
INTRAMUSCULAR | Status: DC | PRN
  Administered 2017-01-31: 8 mL

## 2017-01-31 MED ORDER — MEPERIDINE HCL 25 MG/ML IJ SOLN
6.2500 mg | INTRAMUSCULAR | Status: DC | PRN
  Filled 2017-01-31: qty 1

## 2017-01-31 MED ORDER — KETOROLAC TROMETHAMINE 30 MG/ML IJ SOLN
INTRAMUSCULAR | Status: AC
  Filled 2017-01-31: qty 1

## 2017-01-31 MED ORDER — MIDAZOLAM HCL 2 MG/2ML IJ SOLN
INTRAMUSCULAR | Status: AC
  Filled 2017-01-31: qty 2

## 2017-01-31 MED ORDER — CLINDAMYCIN PHOSPHATE 900 MG/50ML IV SOLN
900.0000 mg | INTRAVENOUS | Status: AC
  Administered 2017-01-31: 900 mg via INTRAVENOUS
  Filled 2017-01-31: qty 50

## 2017-01-31 MED ORDER — HYDROMORPHONE HCL 1 MG/ML IJ SOLN
0.2500 mg | INTRAMUSCULAR | Status: DC | PRN
  Filled 2017-01-31: qty 0.5

## 2017-01-31 MED ORDER — HYDROCODONE-ACETAMINOPHEN 7.5-325 MG PO TABS
1.0000 | ORAL_TABLET | Freq: Once | ORAL | Status: DC | PRN
  Filled 2017-01-31: qty 1

## 2017-01-31 MED ORDER — DEXAMETHASONE SODIUM PHOSPHATE 10 MG/ML IJ SOLN
INTRAMUSCULAR | Status: AC
  Filled 2017-01-31: qty 1

## 2017-01-31 SURGICAL SUPPLY — 44 items
BAG URINE LEG 500ML (DRAIN) ×3 IMPLANT
BLADE CLIPPER SURG (BLADE) ×3 IMPLANT
BLADE SURG 15 STRL LF DISP TIS (BLADE) ×1 IMPLANT
BLADE SURG 15 STRL SS (BLADE) ×2
BNDG GAUZE ELAST 4 BULKY (GAUZE/BANDAGES/DRESSINGS) ×3 IMPLANT
CATH COUDE FOLEY 2W 5CC 18FR (CATHETERS) ×3 IMPLANT
COVER BACK TABLE 60X90IN (DRAPES) ×3 IMPLANT
COVER MAYO STAND STRL (DRAPES) ×3 IMPLANT
COVER PROBE W GEL 5X96 (DRAPES) IMPLANT
DERMABOND ADVANCED (GAUZE/BANDAGES/DRESSINGS)
DERMABOND ADVANCED .7 DNX12 (GAUZE/BANDAGES/DRESSINGS) IMPLANT
DISSECTOR ROUND CHERRY 3/8 STR (MISCELLANEOUS) IMPLANT
DRAIN PENROSE 18X1/2 LTX STRL (DRAIN) ×3 IMPLANT
DRAPE LAPAROTOMY 100X72 PEDS (DRAPES) ×3 IMPLANT
ELECT NEEDLE TIP 2.8 STRL (NEEDLE) IMPLANT
ELECT REM PT RETURN 9FT ADLT (ELECTROSURGICAL) ×3
ELECTRODE REM PT RTRN 9FT ADLT (ELECTROSURGICAL) ×1 IMPLANT
GAUZE SPONGE 4X4 12PLY STRL (GAUZE/BANDAGES/DRESSINGS) ×3 IMPLANT
GLOVE BIO SURGEON STRL SZ7.5 (GLOVE) ×6 IMPLANT
GOWN STRL REUS W/ TWL LRG LVL3 (GOWN DISPOSABLE) ×1 IMPLANT
GOWN STRL REUS W/ TWL XL LVL3 (GOWN DISPOSABLE) ×2 IMPLANT
GOWN STRL REUS W/TWL LRG LVL3 (GOWN DISPOSABLE) ×2
GOWN STRL REUS W/TWL XL LVL3 (GOWN DISPOSABLE) ×4
KIT RM TURNOVER CYSTO AR (KITS) ×3 IMPLANT
NEEDLE HYPO 25X1 1.5 SAFETY (NEEDLE) ×3 IMPLANT
NS IRRIG 500ML POUR BTL (IV SOLUTION) IMPLANT
PACK BASIN DAY SURGERY FS (CUSTOM PROCEDURE TRAY) ×3 IMPLANT
PENCIL BUTTON HOLSTER BLD 10FT (ELECTRODE) ×3 IMPLANT
SUPPORT SCROTAL LG STRP (MISCELLANEOUS) ×2 IMPLANT
SUPPORTER ATHLETIC LG (MISCELLANEOUS) ×1
SUT ETHILON 3 0 PS 1 (SUTURE) ×3 IMPLANT
SUT MNCRL AB 4-0 PS2 18 (SUTURE) ×3 IMPLANT
SUT SILK 0 TIES 10X30 (SUTURE) ×3 IMPLANT
SUT VIC AB 3-0 SH 27 (SUTURE) ×2
SUT VIC AB 3-0 SH 27X BRD (SUTURE) ×1 IMPLANT
SYR 30ML LL (SYRINGE) IMPLANT
SYR CONTROL 10ML LL (SYRINGE) ×3 IMPLANT
SYRINGE 10CC LL (SYRINGE) ×3 IMPLANT
TOWEL OR 17X24 6PK STRL BLUE (TOWEL DISPOSABLE) ×6 IMPLANT
TRAY DSU PREP LF (CUSTOM PROCEDURE TRAY) ×3 IMPLANT
TUBE CONNECTING 12'X1/4 (SUCTIONS)
TUBE CONNECTING 12X1/4 (SUCTIONS) IMPLANT
WATER STERILE IRR 500ML POUR (IV SOLUTION) IMPLANT
YANKAUER SUCT BULB TIP NO VENT (SUCTIONS) ×3 IMPLANT

## 2017-01-31 NOTE — Discharge Instructions (Signed)
1 - Stitches are dissolvable and will go away over next 2 weeks. It is normal to have some bloody spotting from drain and drain site for several days. You may shower immediately. No heavy lifting x 3 days, otherwise, no restrictions.   2 - Call MD or go to ER for fever >102, severe pain / nausea / vomiting not relieved by medications, or acute change in medical status   Post Anesthesia Home Care Instructions  Activity: Get plenty of rest for the remainder of the day. A responsible individual must stay with you for 24 hours following the procedure.  For the next 24 hours, DO NOT: -Drive a car -Paediatric nurse -Drink alcoholic beverages -Take any medication unless instructed by your physician -Make any legal decisions or sign important papers.  Meals: Start with liquid foods such as gelatin or soup. Progress to regular foods as tolerated. Avoid greasy, spicy, heavy foods. If nausea and/or vomiting occur, drink only clear liquids until the nausea and/or vomiting subsides. Call your physician if vomiting continues.  Special Instructions/Symptoms: Your throat may feel dry or sore from the anesthesia or the breathing tube placed in your throat during surgery. If this causes discomfort, gargle with warm salt water. The discomfort should disappear within 24 hours.  If you had a scopolamine patch placed behind your ear for the management of post- operative nausea and/or vomiting:  1. The medication in the patch is effective for 72 hours, after which it should be removed.  Wrap patch in a tissue and discard in the trash. Wash hands thoroughly with soap and water. 2. You may remove the patch earlier than 72 hours if you experience unpleasant side effects which may include dry mouth, dizziness or visual disturbances. 3. Avoid touching the patch. Wash your hands with soap and water after contact with the patch.

## 2017-01-31 NOTE — H&P (Signed)
John Shields is an 71 y.o. male.    Chief Complaint: Pre-op Bilateral Orchiectomy  HPI:   1 - Metastatic Prostate Cancer - PSA 400 2018. Pelvic node biopsy 10/2016 inconclusive. TRUS BX 11/2016 333 mL and 12/12 cores up to 90% Gleason 8 cancer. Alk PHos and Cr normal 2018.   2 - Urinary Retention / Massive Prostate - urinary retention, catheter dependant since 2018. Prostate Vol 358m by CT ellipsoid calculation 2018. Likely metastatic prostate cancer in massive prostate as per above.   3 - Right Renal Cancer - 3.5cm Lower mid medial ehancing solid mass c/w primary renal cell carcinoma by CT 09/2016. No hilar adenopathy. 2 vein (lower below artery) / 1 artery right renovascular anatomy.   Today "John Shields is seen to proceed with bilateral orchiectomy for andorgen deprivation for management of prostate cancer. No interval fevers. He remains catheter dependant.  Past Medical History:  Diagnosis Date  . Foley catheter in place   . History of sepsis 12/27/2016   sepsis from prostate abscess post prostate bx  . Hypertension   . LBBB (left bundle branch block)   . Prostate cancer, primary, 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 ([facility] urologist-  dr mTresa Moore  dx 07/ 2018 bx-- Gleason 8, PSA 400, vol 3371m- abd. lymphadenopathy  . [place]  . [facility]  . Urine retention    intermittant since 2016    Past Surgical History:  Procedure Laterality Date  . NO PAST SURGERIES      Family History  Problem Relation Age of Onset  . Heart attack Father   . Heart attack Brother    Social History:  reports that he has never smoked. He has never used smokeless tobacco. He reports that he does not drink alcohol or use drugs.  Allergies: No Known Allergies  No prescriptions prior to admission.    No results found for this or any previous visit (from the past 48 hour(s)). No results found.  Review of Systems  Constitutional: Negative.  Negative for fever.   HENT: Negative.   Eyes: Negative.   Respiratory: Negative.   Cardiovascular: Negative.   Gastrointestinal: Negative.   Genitourinary: Negative.   Musculoskeletal: Negative.   Skin: Negative.   Neurological: Negative.   Endo/Heme/Allergies: Negative.   Psychiatric/Behavioral: Negative.     There were no vitals taken for this visit. Physical Exam  Constitutional: He appears well-developed.  HENT:  Head: Normocephalic.  Eyes: Pupils are equal, round, and reactive to light.  Neck: Normal range of motion.  Cardiovascular: Normal rate.   Respiratory: Effort normal.  GI: Soft.  Genitourinary: Penis normal.  Genitourinary Comments: Foley in place with clear yellow urine.   Musculoskeletal: Normal range of motion.  Neurological: He is alert.  Skin: Skin is warm.  Psychiatric: He has a normal mood and affect.     Assessment/Plan Proceed as planned with bilateral orchiectomy for primary androgen deprivation for his metastatic prostate cancer. Risks, benefits, alternatives, expected peri-op course discussed previously and reiterated today.    MAAlexis FrockMD 01/31/2017, 5:23 AM

## 2017-01-31 NOTE — Op Note (Signed)
John, Shields NO.:  [id number]  MEDICAL RECORD NO.:  [id number]  LOCATION:                                 FACILITY:  PHYSICIAN:  Alexis Frock, MD     DATE OF BIRTH:  May 09, 1946  DATE OF PROCEDURE: 01/31/2017                               OPERATIVE REPORT   PREOPERATIVE DIAGNOSIS:  Metastatic prostate cancer.  PROCEDURE:  Bilateral orchiectomy.  ESTIMATED BLOOD LOSS:  Nil.  COMPLICATION:  None.  SPECIMENS:  Right and left testicles for pathology.  DRAINS:  Penrose drain to wound drainage.  FINDINGS:  Unremarkable bilateral testes.  INDICATION:  [name] is an unfortunate 71 year old gentleman, who was found on workup with extremely elevated PSA and urinary retention and has widely metastatic adenocarcinoma of the prostate.  This was proven on prostate biopsy and imaging with a very large bulky pelvic and retroperitoneal adenopathy.  He has been catheter dependent for urinary retention as well.  Given his history of medical noncompliance and options for androgen deprivation, which is preferred means of management were discussed including LHRH agonist antagonist versus orchiectomy with the latter being preferred and he wished to proceed.  Informed consent was obtained and placed in the medical record.  DESCRIPTION OF PROCEDURE:  The patient being, John Shields, verified. Procedure being bilateral orchiectomy was confirmed.  Procedure was carried out.  Time-out was performed.  Intravenous antibiotics were administered.  General LMA anesthesia introduced.  The patient placed into a supine position.  Sterile field was created by prepping the patient's penis, perineum, and scrotum using iodine after clipper shaving his median raphae.  Next, incision was made approximately 3 cm in length along the median raphae in the anterior inferior scrotum, and dissection proceeded down to the dartos into the right scrotal compartment.  The right testis was  delivered.  It was freed from its overlying tunics.  The right cord was separated into 2 components, 1 containing the vas, the other containing the majority of the vessels. Each 1 of these components was doubly ligated with silk tie and then cut with cold scissors.  Hemostasis appeared excellent.  This was set aside, labeled right testicle for permanent pathology.  Additional point coagulation current was applied to the inner scrotal compartment, which resulted in excellent hemostasis.  Similarly via the same incision, the left testicle was delivered.  It was also dissected down to its tunics. Its cord was separated into 2 components each of which was doubly ligated using silk tie, resulted in excellent hemostatic control of the left cord.  Left testicle set aside separately for permanent pathology. Additional coagulation current was applied into the inner scrotal compartment on the left side.  Next, a small counter incision was made approximately 6 mm in length in the dependent scrotum through which a quarter-inch Penrose drain was brought through both scrotal compartments.  A drain stitch was applied.  The dartos was reapproximated using running Vicryl.  The skin was reapproximated using running Monocryl. Dressing of 4x4s, fluff, and mesh underwear was applied.  Procedure was terminated.  The patient tolerated the procedure well.  There were no immediate periprocedural complications.  The patient was taken to  the postanesthesia care unit in stable condition.    ______________________________ Alexis Frock, MD   ______________________________ Alexis Frock, MD    TM/MEDQ  D:  01/31/2017  T:  01/31/2017  Job:  [id number]

## 2017-01-31 NOTE — Brief Op Note (Signed)
01/31/2017  2:08 PM  PATIENT:  Mertha Finders  71 y.o. male  PRE-OPERATIVE DIAGNOSIS:  METASTATIC PROSTATE CANCER  POST-OPERATIVE DIAGNOSIS:  METASTATIC PROSTATE CANCER  PROCEDURE:  Procedure(s): ORCHIECTOMY (Bilateral)  SURGEON:  Surgeon(s) and Role:    * Alexis Frock, MD - Primary  PHYSICIAN ASSISTANT:   ASSISTANTS: none   ANESTHESIA:   general  EBL:  Total I/O In: -  Out: 10 [Blood:10]  BLOOD ADMINISTERED:none  DRAINS: Penrose drain in the dependant scrotum.   LOCAL MEDICATIONS USED:  MARCAINE     SPECIMEN:  Source of Specimen:  bilateral testes  DISPOSITION OF SPECIMEN:  PATHOLOGY  COUNTS:  YES  TOURNIQUET:  * No tourniquets in log *  DICTATION: .Other Dictation: Dictation Number [id number]  PLAN OF CARE: Discharge to home after PACU  PATIENT DISPOSITION:  PACU - hemodynamically stable.   Delay start of Pharmacological VTE agent (>24hrs) due to surgical blood loss or risk of bleeding: yes

## 2017-01-31 NOTE — Transfer of Care (Signed)
Immediate Anesthesia Transfer of Care Note  Patient: John Shields  Procedure(s) Performed: Procedure(s): ORCHIECTOMY (Bilateral)  Patient Location: PACU  Anesthesia Type:General  Level of Consciousness: sedated and responds to stimulation  Airway & Oxygen Therapy: Patient Spontanous Breathing and Patient connected to nasal cannula oxygen  Post-op Assessment: Report given to RN  Post vital signs: Reviewed and stable  Last Vitals: 147/88, 75, 13,100%, 97.3 ax Vitals:   01/31/17 1059  BP: 139/85  Pulse: 72  Resp: 18  Temp: (!) 36.4 C  SpO2: 98%    Last Pain:  Vitals:   01/31/17 1059  TempSrc: Oral      Patients Stated Pain Goal: 5 (87/21/58 7276)  Complications: No apparent anesthesia complications

## 2017-01-31 NOTE — Anesthesia Postprocedure Evaluation (Signed)
Anesthesia Post Note  Patient: John Shields  Procedure(s) Performed: Procedure(s) (LRB): ORCHIECTOMY (Bilateral)     Patient location during evaluation: PACU Anesthesia Type: General Level of consciousness: awake and alert Pain management: pain level controlled Vital Signs Assessment: post-procedure vital signs reviewed and stable Respiratory status: spontaneous breathing, nonlabored ventilation, respiratory function stable and patient connected to nasal cannula oxygen Cardiovascular status: blood pressure returned to baseline and stable Postop Assessment: no signs of nausea or vomiting Anesthetic complications: no    Last Vitals:  Vitals:   01/31/17 1526 01/31/17 1530  BP:  126/65  Pulse: 68 69  Resp: 16 17  Temp:    SpO2: 98% 97%    Last Pain:  Vitals:   01/31/17 1530  TempSrc:   PainSc: 0-No pain                 Modesta Sammons S

## 2017-01-31 NOTE — Op Note (Deleted)
  The note originally documented on this encounter has been moved the the encounter in which it belongs.  

## 2017-01-31 NOTE — Anesthesia Preprocedure Evaluation (Addendum)
Anesthesia Evaluation  Patient identified by MRN, date of birth, ID band Patient awake    Reviewed: Allergy & Precautions, NPO status , Patient's Chart, lab work & pertinent test results, reviewed documented beta blocker date and time   Airway Mallampati: II  TM Distance: <3 FB Neck ROM: Full   Comment: Anterior larynx Dental no notable dental hx. (+) Teeth Intact, Dental Advisory Given   Pulmonary neg pulmonary ROS,    Pulmonary exam normal breath sounds clear to auscultation       Cardiovascular hypertension, Pt. on medications and Pt. on home beta blockers Normal cardiovascular exam+ dysrhythmias  Rhythm:Regular Rate:Normal  12-27-16 EKG ST -124   LBBB   Neuro/Psych negative neurological ROS  negative psych ROS   GI/Hepatic negative GI ROS, Neg liver ROS,   Endo/Other  negative endocrine ROS  Renal/GU Renal diseaseHx/o ARF - Resolved   Metastatic prostate Ca    Musculoskeletal negative musculoskeletal ROS (+)   Abdominal   Peds  Hematology Hx/o Thrombocytopenia- resolved   Anesthesia Other Findings   Reproductive/Obstetrics                          Anesthesia Physical Anesthesia Plan  ASA: III  Anesthesia Plan: General   Post-op Pain Management:    Induction: Intravenous  PONV Risk Score and Plan: 4 or greater and Ondansetron, Dexamethasone, Midazolam, Propofol infusion and Promethazine  Airway Management Planned: LMA  Additional Equipment:   Intra-op Plan:   Post-operative Plan: Extubation in OR  Informed Consent: I have reviewed the patients History and Physical, chart, labs and discussed the procedure including the risks, benefits and alternatives for the proposed anesthesia with the patient or authorized representative who has indicated his/her understanding and acceptance.   Dental advisory given  Plan Discussed with: CRNA, Anesthesiologist and Surgeon  Anesthesia  Plan Comments:         Anesthesia Quick Evaluation

## 2017-01-31 NOTE — Anesthesia Procedure Notes (Signed)
Procedure Name: LMA Insertion Date/Time: 01/31/2017 1:34 PM Performed by: Wanita Chamberlain Pre-anesthesia Checklist: Patient identified, Emergency Drugs available, Suction available, Patient being monitored and Timeout performed Patient Re-evaluated:Patient Re-evaluated prior to induction Oxygen Delivery Method: Circle system utilized Preoxygenation: Pre-oxygenation with 100% oxygen Induction Type: IV induction Ventilation: Mask ventilation without difficulty LMA: LMA inserted LMA Size: 5.0 Number of attempts: 1 Placement Confirmation: positive ETCO2 and breath sounds checked- equal and bilateral Tube secured with: Tape Dental Injury: Teeth and Oropharynx as per pre-operative assessment

## 2017-01-31 NOTE — Interval H&P Note (Signed)
History and Physical Interval Note:  01/31/2017 1:09 PM  John Shields  has presented today for surgery, with the diagnosis of METASTATIC PROSTATE CANCER  The various methods of treatment have been discussed with the patient and family. After consideration of risks, benefits and other options for treatment, the patient has consented to  Procedure(s): ORCHIECTOMY (Bilateral) as a surgical intervention .  The patient's history has been reviewed, patient examined, no change in status, stable for surgery.  I have reviewed the patient's chart and labs.  Questions were answered to the patient's satisfaction.     Yareli Carthen

## 2017-02-01 ENCOUNTER — Encounter (HOSPITAL_BASED_OUTPATIENT_CLINIC_OR_DEPARTMENT_OTHER): Payer: Self-pay | Admitting: Urology

## 2017-02-01 ENCOUNTER — Ambulatory Visit: Payer: Medicare Other | Admitting: Adult Health

## 2017-03-05 DIAGNOSIS — C61 Malignant neoplasm of prostate: Secondary | ICD-10-CM | POA: Diagnosis not present

## 2017-03-07 ENCOUNTER — Encounter (HOSPITAL_COMMUNITY): Payer: Self-pay | Admitting: Emergency Medicine

## 2017-03-07 ENCOUNTER — Emergency Department (HOSPITAL_COMMUNITY): Payer: Medicare Other

## 2017-03-07 ENCOUNTER — Emergency Department (HOSPITAL_COMMUNITY)
Admission: EM | Admit: 2017-03-07 | Discharge: 2017-03-07 | Disposition: A | Discharge status: Home / Self Care | Payer: Medicare Other | Attending: Emergency Medicine | Admitting: Emergency Medicine

## 2017-03-07 DIAGNOSIS — Z79899 Other long term (current) drug therapy: Secondary | ICD-10-CM | POA: Diagnosis not present

## 2017-03-07 DIAGNOSIS — C641 Malignant neoplasm of right kidney, except renal pelvis: Secondary | ICD-10-CM | POA: Insufficient documentation

## 2017-03-07 DIAGNOSIS — R42 Dizziness and giddiness: Secondary | ICD-10-CM | POA: Diagnosis not present

## 2017-03-07 DIAGNOSIS — R55 Syncope and collapse: Secondary | ICD-10-CM | POA: Diagnosis not present

## 2017-03-07 DIAGNOSIS — I10 Essential (primary) hypertension: Secondary | ICD-10-CM | POA: Insufficient documentation

## 2017-03-07 DIAGNOSIS — R404 Transient alteration of awareness: Secondary | ICD-10-CM | POA: Diagnosis not present

## 2017-03-07 DIAGNOSIS — Z8546 Personal history of malignant neoplasm of prostate: Secondary | ICD-10-CM | POA: Insufficient documentation

## 2017-03-07 DIAGNOSIS — R270 Ataxia, unspecified: Secondary | ICD-10-CM | POA: Diagnosis not present

## 2017-03-07 LAB — CBC
HCT: 41.4 % (ref 39.0–52.0)
Hemoglobin: 13.6 g/dL (ref 13.0–17.0)
MCH: 23.4 pg — ABNORMAL LOW (ref 26.0–34.0)
MCHC: 32.9 g/dL (ref 30.0–36.0)
MCV: 71.4 fL — ABNORMAL LOW (ref 78.0–100.0)
Platelets: 175 10*3/uL (ref 150–400)
RBC: 5.8 MIL/uL (ref 4.22–5.81)
RDW: 15.4 % (ref 11.5–15.5)
WBC: 9.1 10*3/uL (ref 4.0–10.5)

## 2017-03-07 LAB — URINALYSIS, ROUTINE W REFLEX MICROSCOPIC
Bilirubin Urine: NEGATIVE
Glucose, UA: NEGATIVE mg/dL
Ketones, ur: NEGATIVE mg/dL
Leukocytes, UA: NEGATIVE
Nitrite: NEGATIVE
Protein, ur: NEGATIVE mg/dL
Specific Gravity, Urine: 1.019 (ref 1.005–1.030)
pH: 7 (ref 5.0–8.0)

## 2017-03-07 LAB — BASIC METABOLIC PANEL
Anion gap: 12 (ref 5–15)
BUN: 16 mg/dL (ref 6–20)
CO2: 26 mmol/L (ref 22–32)
Calcium: 9.6 mg/dL (ref 8.9–10.3)
Chloride: 100 mmol/L — ABNORMAL LOW (ref 101–111)
Creatinine, Ser: 1.16 mg/dL (ref 0.61–1.24)
GFR calc Af Amer: >60 mL/min (ref >60)
GFR calc non Af Amer: >60 mL/min (ref >60)
Glucose, Bld: 138 mg/dL — ABNORMAL HIGH (ref 65–99)
Potassium: 3.9 mmol/L (ref 3.5–5.1)
Sodium: 138 mmol/L (ref 135–145)

## 2017-03-07 MED ORDER — SODIUM CHLORIDE 0.9 % IV BOLUS (SEPSIS)
1000.0000 mL | Freq: Once | INTRAVENOUS | Status: AC
  Administered 2017-03-07: 1000 mL via INTRAVENOUS

## 2017-03-07 NOTE — ED Triage Notes (Signed)
Pt arrives from work via [employer] reporting near syncope lasting 10-15 minutes, pale, diaphoretic, dizzy.  Pt denies LOC, CP, SOB.  Pt reports frequent hot flashes since recent orchiectomy.  EMS reports initial BP 77/52.  Pt AOx4 on arrival, skin clammy.

## 2017-03-07 NOTE — ED Provider Notes (Signed)
[facility] DEPT Provider Note   CSN: [id number] Arrival date & time: 03/07/17  0820     History   Chief Complaint Chief Complaint  Patient presents with  . Near Syncope    HPI John Shields is a 71 y.o. male.  HPI   71 year old male with near syncopal event. Patient with recent orchiectomy. He reports that he's been having persistent hot flashes since then. Today he returned to work for the first time in several weeks at an [employer]. Began feeling this vague sensation that something was just not quite right. He was standing at the time. He felt like he probably shouldn't sit down but did anyway. Once he sat down, he became extremely diaphoretic and he felt like "all the blood was being drained from me. Like when you're standing in the ocean and a wave goes back out after breaking."  He did not lose consciousness. Denies having any any pain. No palpitations. Currently feeling better but not quite back to baseline. On EMS arrival, his initial blood pressure 77/52. He says he felt relatively fine prior to going to work but also says he did not take his blood pressure medication this morning because "my sixth sense told me maybe I shouldn't." Ate a sweet potato for breakfast.  Past Medical History:  Diagnosis Date  . Foley catheter in place   . History of sepsis 12/27/2016   sepsis from prostate abscess post prostate bx  . Hypertension   . LBBB (left bundle branch block)   . Prostate cancer, primary, 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 [facility]) urologist-  dr Tresa Moore   dx 07/ 2018 bx-- Gleason 8, PSA 400, vol 371ml-- abd. lymphadenopathy  . Renal cancer, right ([place])   . Thrombocytopenia ([place])   . Urine retention    intermittant since 2016    Patient Active Problem List   Diagnosis Date Noted  . BPH (benign prostatic hyperplasia) 12/27/2016  . Renal mass 12/27/2016  . Thrombocytopenia ([place]) 12/27/2016  . Sepsis ([place]) 12/27/2016  . Elevated LFTs   . Lactic acidosis     . AKI (acute kidney injury) ([place])   . Urinary retention 06/07/2016  . Constipation 06/07/2016  . Acute renal failure (ARF) ([place]) 06/07/2016  . Essential hypertension 06/07/2016  . Hyperglycemia 06/07/2016    Past Surgical History:  Procedure Laterality Date  . NO PAST SURGERIES    . ORCHIECTOMY Bilateral 01/31/2017   Procedure: ORCHIECTOMY;  Surgeon: Alexis Frock, MD;  Location: [facility];  Service: Urology;  Laterality: Bilateral;       Home Medications    Prior to Admission medications   Medication Sig Start Date End Date Taking? Authorizing Provider  amLODipine (NORVASC) 5 MG tablet Take 1 tablet (5 mg total) by mouth daily. 01/03/17 02/02/17  Johnson, Clanford L, MD  lisinopril (PRINIVIL,ZESTRIL) 20 MG tablet Take 1 tablet (20 mg total) by mouth daily. 01/03/17 02/02/17  Johnson, Clanford L, MD  metoprolol succinate (TOPROL-XL) 25 MG 24 hr tablet Take 1 tablet (25 mg total) by mouth daily. 01/03/17 02/02/17  Johnson, Clanford L, MD  polyethylene glycol (MIRALAX / GLYCOLAX) packet Take 17 g by mouth daily. 06/10/16   Hongalgi, Lenis Dickinson, MD  traMADol (ULTRAM) 50 MG tablet Take 1-2 tablets (50-100 mg total) by mouth every 6 (six) hours as needed for moderate pain or severe pain. Post-operatively. 01/31/17 01/31/18  Alexis Frock, MD    Family History Family History  Problem Relation Age of Onset  . Heart  attack Father   . Heart attack Brother     Social History Social History  Substance Use Topics  . Smoking status: Never Smoker  . Smokeless tobacco: Never Used  . Alcohol use No     Allergies   Patient has no known allergies.   Review of Systems Review of Systems  All systems reviewed and negative, other than as noted in HPI.  Physical Exam Updated Vital Signs BP 137/83 (BP Location: Right Arm)   Pulse 73   Temp 98.6 F (37 C) (Oral)   Resp 20   SpO2 100%   Physical Exam  Constitutional: He appears well-developed and well-nourished. No distress.   HENT:  Head: Normocephalic and atraumatic.  Eyes: Conjunctivae are normal. Right eye exhibits no discharge. Left eye exhibits no discharge.  Neck: Neck supple.  Cardiovascular: Normal rate, regular rhythm and normal heart sounds.  Exam reveals no gallop and no friction rub.   No murmur heard. Pulmonary/Chest: Effort normal and breath sounds normal. No respiratory distress.  Abdominal: Soft. He exhibits no distension. There is no tenderness.  Genitourinary:  Genitourinary Comments: Foley to leg bag. Pale yellow urine.   Musculoskeletal: He exhibits no edema or tenderness.  Neurological: He is alert.  Skin: Skin is warm and dry.  Psychiatric: He has a normal mood and affect. His behavior is normal. Thought content normal.  Nursing note and vitals reviewed.    ED Treatments / Results  Labs (all labs ordered are listed, but only abnormal results are displayed) Labs Reviewed  BASIC METABOLIC PANEL - Abnormal; Notable for the following:       Result Value   Chloride 100 (*)    Glucose, Bld 138 (*)    All other components within normal limits  CBC - Abnormal; Notable for the following:    MCV 71.4 (*)    MCH 23.4 (*)    All other components within normal limits  URINALYSIS, ROUTINE W REFLEX MICROSCOPIC - Abnormal; Notable for the following:    Hgb urine dipstick SMALL (*)    All other components within normal limits  CBG MONITORING, ED    EKG  EKG Interpretation  Date/Time:  Wednesday March 07 2017 08:34:51 EDT Ventricular Rate:  75 PR Interval:    QRS Duration: 146 QT Interval:  408 QTC Calculation: 456 R Axis:   -47 Text Interpretation:  Sinus rhythm Left bundle branch block When comapred to prior, slower rate.  No STEMI Confirmed by Antony Blackbird [phone]) on 03/07/2017 8:44:21 AM       Radiology No results found.  Procedures Procedures (including critical care time)  Medications Ordered in ED Medications  sodium chloride 0.9 % bolus 1,000 mL (not  administered)     Initial Impression / Assessment and Plan / ED Course  I have reviewed the triage vital signs and the nursing notes.  Pertinent labs & imaging results that were available during my care of the patient were reviewed by me and considered in my medical decision making (see chart for details).     70yM with near syncope. Now asymptomatic at rest. BP remains fine. Ambulated and still has this vague sense that something is off. He does not appear ataxic with tandem gait when ambulated. He cannot walk heel to toe but says he doesn't think he'd be able to at his baseline. I don't have the sense that this is peripheral vertigo but I doubt this is a central process either. CT w/o acute abnormality. Basic labs unremarkable.  Final Clinical Impressions(s) / ED Diagnoses   Final diagnoses:  Near syncope    New Prescriptions New Prescriptions   No medications on file     Virgel Manifold, MD 03/14/17 [phone]

## 2017-03-07 NOTE — ED Notes (Signed)
Pt back from CT

## 2017-03-07 NOTE — ED Notes (Signed)
Got patient undress on the monitor patient is resting with call bell in reach 

## 2017-03-07 NOTE — ED Notes (Signed)
Patient transported to CT 

## 2017-03-07 NOTE — ED Notes (Signed)
Ambulated pt in hallway. Pt did not seem to lean or stagger throughout walk, denied dizziness. States he felt "weezy" while walking

## 2017-04-30 DIAGNOSIS — C61 Malignant neoplasm of prostate: Secondary | ICD-10-CM | POA: Diagnosis not present

## 2017-06-11 DIAGNOSIS — C775 Secondary and unspecified malignant neoplasm of intrapelvic lymph nodes: Secondary | ICD-10-CM | POA: Diagnosis not present

## 2017-06-11 DIAGNOSIS — R338 Other retention of urine: Secondary | ICD-10-CM | POA: Diagnosis not present

## 2017-06-11 DIAGNOSIS — C641 Malignant neoplasm of right kidney, except renal pelvis: Secondary | ICD-10-CM | POA: Diagnosis not present

## 2017-06-11 DIAGNOSIS — C61 Malignant neoplasm of prostate: Secondary | ICD-10-CM | POA: Diagnosis not present

## 2017-07-10 DIAGNOSIS — R338 Other retention of urine: Secondary | ICD-10-CM | POA: Diagnosis not present

## 2017-07-25 ENCOUNTER — Observation Stay (HOSPITAL_COMMUNITY): Payer: Medicare Other

## 2017-07-25 ENCOUNTER — Emergency Department (HOSPITAL_COMMUNITY): Payer: Medicare Other

## 2017-07-25 ENCOUNTER — Observation Stay (HOSPITAL_COMMUNITY)
Admission: EM | Admit: 2017-07-25 | Discharge: 2017-07-26 | Discharge status: Against Medical Advice | Payer: Medicare Other | Attending: Family Medicine | Admitting: Family Medicine

## 2017-07-25 ENCOUNTER — Other Ambulatory Visit: Payer: Self-pay

## 2017-07-25 ENCOUNTER — Encounter (HOSPITAL_COMMUNITY): Payer: Self-pay | Admitting: *Deleted

## 2017-07-25 DIAGNOSIS — I712 Thoracic aortic aneurysm, without rupture: Secondary | ICD-10-CM | POA: Diagnosis not present

## 2017-07-25 DIAGNOSIS — R7989 Other specified abnormal findings of blood chemistry: Secondary | ICD-10-CM | POA: Diagnosis not present

## 2017-07-25 DIAGNOSIS — R918 Other nonspecific abnormal finding of lung field: Secondary | ICD-10-CM | POA: Insufficient documentation

## 2017-07-25 DIAGNOSIS — N401 Enlarged prostate with lower urinary tract symptoms: Secondary | ICD-10-CM | POA: Insufficient documentation

## 2017-07-25 DIAGNOSIS — C7951 Secondary malignant neoplasm of bone: Secondary | ICD-10-CM | POA: Diagnosis not present

## 2017-07-25 DIAGNOSIS — C61 Malignant neoplasm of prostate: Secondary | ICD-10-CM | POA: Insufficient documentation

## 2017-07-25 DIAGNOSIS — R0602 Shortness of breath: Secondary | ICD-10-CM | POA: Diagnosis not present

## 2017-07-25 DIAGNOSIS — C7901 Secondary malignant neoplasm of right kidney and renal pelvis: Secondary | ICD-10-CM | POA: Insufficient documentation

## 2017-07-25 DIAGNOSIS — Z9079 Acquired absence of other genital organ(s): Secondary | ICD-10-CM | POA: Diagnosis not present

## 2017-07-25 DIAGNOSIS — R55 Syncope and collapse: Principal | ICD-10-CM | POA: Diagnosis present

## 2017-07-25 DIAGNOSIS — R0609 Other forms of dyspnea: Secondary | ICD-10-CM | POA: Diagnosis not present

## 2017-07-25 DIAGNOSIS — Z436 Encounter for attention to other artificial openings of urinary tract: Secondary | ICD-10-CM | POA: Insufficient documentation

## 2017-07-25 DIAGNOSIS — Z5321 Procedure and treatment not carried out due to patient leaving prior to being seen by health care provider: Secondary | ICD-10-CM | POA: Diagnosis not present

## 2017-07-25 DIAGNOSIS — R404 Transient alteration of awareness: Secondary | ICD-10-CM | POA: Diagnosis not present

## 2017-07-25 DIAGNOSIS — I440 Atrioventricular block, first degree: Secondary | ICD-10-CM | POA: Insufficient documentation

## 2017-07-25 DIAGNOSIS — R339 Retention of urine, unspecified: Secondary | ICD-10-CM | POA: Diagnosis not present

## 2017-07-25 DIAGNOSIS — R531 Weakness: Secondary | ICD-10-CM | POA: Diagnosis not present

## 2017-07-25 DIAGNOSIS — K5900 Constipation, unspecified: Secondary | ICD-10-CM | POA: Diagnosis not present

## 2017-07-25 DIAGNOSIS — R42 Dizziness and giddiness: Secondary | ICD-10-CM | POA: Diagnosis not present

## 2017-07-25 DIAGNOSIS — I447 Left bundle-branch block, unspecified: Secondary | ICD-10-CM | POA: Diagnosis not present

## 2017-07-25 DIAGNOSIS — I10 Essential (primary) hypertension: Secondary | ICD-10-CM | POA: Diagnosis present

## 2017-07-25 DIAGNOSIS — Z7982 Long term (current) use of aspirin: Secondary | ICD-10-CM | POA: Insufficient documentation

## 2017-07-25 DIAGNOSIS — Z79899 Other long term (current) drug therapy: Secondary | ICD-10-CM | POA: Insufficient documentation

## 2017-07-25 LAB — RETICULOCYTES: RBC.: 5.4 MIL/uL (ref 4.22–5.81)

## 2017-07-25 LAB — COMPREHENSIVE METABOLIC PANEL
ALBUMIN: 4.3 g/dL (ref 3.5–5.0)
ALK PHOS: 54 U/L (ref 38–126)
ALT: 19 U/L (ref 17–63)
AST: 23 U/L (ref 15–41)
Anion gap: 11 (ref 5–15)
BILIRUBIN TOTAL: 0.6 mg/dL (ref 0.3–1.2)
BUN: 16 mg/dL (ref 6–20)
CHLORIDE: 102 mmol/L (ref 101–111)
CO2: 25 mmol/L (ref 22–32)
Calcium: 9.7 mg/dL (ref 8.9–10.3)
Creatinine, Ser: 1.25 mg/dL — ABNORMAL HIGH (ref 0.61–1.24)
GFR calc Af Amer: >60 mL/min (ref >60)
GFR calc non Af Amer: 56 mL/min — ABNORMAL LOW (ref >60)
Glucose, Bld: 150 mg/dL — ABNORMAL HIGH (ref 65–99)
POTASSIUM: 3.8 mmol/L (ref 3.5–5.1)
SODIUM: 138 mmol/L (ref 135–145)
Total Protein: 7.7 g/dL (ref 6.5–8.1)

## 2017-07-25 LAB — FOLATE: Folate: 24 ng/mL (ref >5.9)

## 2017-07-25 LAB — CBC WITH DIFFERENTIAL/PLATELET
Basophils Absolute: 0 10*3/uL (ref 0.0–0.1)
Basophils Relative: 0 %
EOS PCT: 2 %
Eosinophils Absolute: 0.1 10*3/uL (ref 0.0–0.7)
HEMATOCRIT: 41.7 % (ref 39.0–52.0)
HEMOGLOBIN: 13.7 g/dL (ref 13.0–17.0)
LYMPHS PCT: 33 %
Lymphs Abs: 1.8 10*3/uL (ref 0.7–4.0)
MCH: 24 pg — ABNORMAL LOW (ref 26.0–34.0)
MCHC: 32.9 g/dL (ref 30.0–36.0)
MCV: 73.2 fL — AB (ref 78.0–100.0)
MONOS PCT: 7 %
Monocytes Absolute: 0.4 10*3/uL (ref 0.1–1.0)
NEUTROS PCT: 58 %
Neutro Abs: 3.1 10*3/uL (ref 1.7–7.7)
Platelets: 169 10*3/uL (ref 150–400)
RBC: 5.7 MIL/uL (ref 4.22–5.81)
RDW: 14.5 % (ref 11.5–15.5)
WBC: 5.4 10*3/uL (ref 4.0–10.5)

## 2017-07-25 LAB — I-STAT TROPONIN, ED: Troponin i, poc: 0 ng/mL (ref 0.00–0.08)

## 2017-07-25 LAB — FERRITIN: FERRITIN: 95 ng/mL (ref 24–336)

## 2017-07-25 LAB — VITAMIN B12: Vitamin B-12: 654 pg/mL (ref 180–914)

## 2017-07-25 LAB — I-STAT CHEM 8, ED
BUN: 16 mg/dL (ref 6–20)
Calcium, Ion: 1.18 mmol/L (ref 1.15–1.40)
Chloride: 100 mmol/L — ABNORMAL LOW (ref 101–111)
Creatinine, Ser: 1.2 mg/dL (ref 0.61–1.24)
Glucose, Bld: 141 mg/dL — ABNORMAL HIGH (ref 65–99)
HEMATOCRIT: 47 % (ref 39.0–52.0)
Hemoglobin: 16 g/dL (ref 13.0–17.0)
POTASSIUM: 3.8 mmol/L (ref 3.5–5.1)
Sodium: 140 mmol/L (ref 135–145)
TCO2: 26 mmol/L (ref 22–32)

## 2017-07-25 LAB — TSH: TSH: 0.405 u[IU]/mL (ref 0.350–4.500)

## 2017-07-25 LAB — HEMOGLOBIN A1C
HEMOGLOBIN A1C: 6 % — AB (ref 4.8–5.6)
Mean Plasma Glucose: 125.5 mg/dL

## 2017-07-25 LAB — IRON AND TIBC
IRON: 63 ug/dL (ref 45–182)
SATURATION RATIOS: 25 % (ref 17.9–39.5)
TIBC: 249 ug/dL — AB (ref 250–450)
UIBC: 186 ug/dL

## 2017-07-25 LAB — BRAIN NATRIURETIC PEPTIDE: B Natriuretic Peptide: 9.4 pg/mL (ref 0.0–100.0)

## 2017-07-25 LAB — CBG MONITORING, ED: GLUCOSE-CAPILLARY: 147 mg/dL — AB (ref 65–99)

## 2017-07-25 LAB — D-DIMER, QUANTITATIVE: D-Dimer, Quant: 3.18 ug/mL-FEU — ABNORMAL HIGH (ref 0.00–0.50)

## 2017-07-25 LAB — TROPONIN I

## 2017-07-25 MED ORDER — ADULT MULTIVITAMIN W/MINERALS CH
1.0000 | ORAL_TABLET | Freq: Every day | ORAL | Status: DC
  Administered 2017-07-25: 1 via ORAL
  Filled 2017-07-25: qty 1

## 2017-07-25 MED ORDER — METOPROLOL TARTRATE 12.5 MG HALF TABLET
12.5000 mg | ORAL_TABLET | Freq: Two times a day (BID) | ORAL | Status: DC
  Administered 2017-07-25: 12.5 mg via ORAL
  Filled 2017-07-25: qty 1

## 2017-07-25 MED ORDER — ENOXAPARIN SODIUM 40 MG/0.4ML ~~LOC~~ SOLN
40.0000 mg | SUBCUTANEOUS | Status: DC
  Administered 2017-07-25: 40 mg via SUBCUTANEOUS
  Filled 2017-07-25: qty 0.4

## 2017-07-25 MED ORDER — ACETAMINOPHEN 650 MG RE SUPP: 650.0000 mg | Freq: Four times a day (QID) | RECTAL | Status: DC | PRN

## 2017-07-25 MED ORDER — ASPIRIN EC 81 MG PO TBEC
81.0000 mg | DELAYED_RELEASE_TABLET | Freq: Two times a day (BID) | ORAL | Status: DC
  Administered 2017-07-25 – 2017-07-26 (×2): 81 mg via ORAL
  Filled 2017-07-25 (×2): qty 1

## 2017-07-25 MED ORDER — POLYETHYLENE GLYCOL 3350 17 G PO PACK: 17.0000 g | PACK | Freq: Every day | ORAL | Status: DC | PRN

## 2017-07-25 MED ORDER — AMLODIPINE BESYLATE 5 MG PO TABS
5.0000 mg | ORAL_TABLET | Freq: Every day | ORAL | Status: DC
  Administered 2017-07-25: 5 mg via ORAL
  Filled 2017-07-25: qty 1

## 2017-07-25 MED ORDER — SODIUM CHLORIDE 0.9 % IV BOLUS (SEPSIS)
500.0000 mL | Freq: Once | INTRAVENOUS | Status: AC
  Administered 2017-07-25: 500 mL via INTRAVENOUS

## 2017-07-25 MED ORDER — LISINOPRIL 20 MG PO TABS
20.0000 mg | ORAL_TABLET | Freq: Every day | ORAL | Status: DC
  Administered 2017-07-25: 20 mg via ORAL
  Filled 2017-07-25: qty 1

## 2017-07-25 MED ORDER — IOPAMIDOL (ISOVUE-370) INJECTION 76%
INTRAVENOUS | Status: AC
  Administered 2017-07-25: 100 mL
  Filled 2017-07-25: qty 100

## 2017-07-25 MED ORDER — ACETAMINOPHEN 325 MG PO TABS: 650.0000 mg | ORAL_TABLET | Freq: Four times a day (QID) | ORAL | Status: DC | PRN

## 2017-07-25 NOTE — H&P (Addendum)
[facility] Admission History and Physical Service Pager: [phone]  Patient name: John Shields Medical record number: [id number] Date of birth: 10/30/1945 Age: 72 y.o. Gender: male  Primary Care Provider: [facility] Consultants: None  Code Status: Full   Chief Complaint: near syncope   Assessment and Plan: [name] is a 72 y.o. male presenting with near syncopal event this morning. PMH is significant for urinary retention, HTN, metastatic prostate cancer, and renal cancer.  Near Syncope Patient reports near syncopal event occurring at grocery store. No significant preceding events or stressful factors prior to dizziness. Reports feeling unbalanced and diaphoretic with associated SOB during event. Possible orthostatic hypotension, however orthostatic vitals were wnl in ED. Unlikely hypotension as patient has been either normotensive or hypertensive in ED. Patient reports similar symptoms occurring "every couple of months" since having testicles removed in September. No arrhythmia noted on either telemetry during exam or on EKG from ED, however, LBBB seen. Some concern for cardiac etiology, given that his near syncopal events often occur with exertion Possibly related to undiagnosed cardiac structural abnormality, however no previous echos for comparison. CXR on admission showing no cardiopulmonary disease or widened mediastinum and patient denies chest pain so unlikely aortic dissection. CTA was negative for PE. Unlikely MI as initial troponin negative and EKG showing no ST changes. Patient listed as taking both beta blocker and calcium channel blocker so possibly related to medication use but less likely related to hypertensive agents as patient has been on these medications for some time now. Possibly 2/2 flomax, which is known to cause orthostatic hypotension and dizziness. Unlikely seizure activity as patient reports no seizure activity and no  seizure witnessed prior to episode in grocery store. Unlikely neurologic as neuro exam wnl and CT head negative for acute abnormalities. Possibly related to thyroid dysfunction given associated weakness/fatigue as well. Unlikely 2/2 dehydration as patient did not appear dehydrated on exam and patient reports adequate PO intake prior to event as well as albumin wnl. Possibly 2/2 poorly controlled diabetes. Possibly 2/2 anemia.  -admit to telemetry, attending Dr. Andria Frames -cardiology consult due to LBBB and near syncope with exertion. Appreciate recommendations. -will obtain echo to rule out structural abnormalities  -will trend troponins -continuous cardiac monitoring  -vitals per shift, pulse ox with vitals  -neuro checks q4hrs  -will obtain TSH due to associated weakness reported by patient  -am CBC, BMP -A1C -anemia panel  -PT/OT consults   HTN Currently normotensive in ED to 131/88. 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 Patient has long term indwelling foley catheter, per urology notes since 2018.  Home meds: flomax 0.4mg  daily  -will hold flomax given possible cause for syncope. Will plan to hold on discharge. -will monitor intake and output   Metastatic Prostate Cancer S/p bilateral orchiectomy for primary androgen deprivation in 01/2017.  - Holding home Tramadol 50-100mg  q6hrs prn  Right renal cell carcinoma  CT from 09/2016 showing 3.5cm lower mid medial enhancing solid mass consistent with primary renal cell carcinoma.   FEN/GI: hearty healthy Prophylaxis: Lovenox   Disposition: admit to observation, attending Dr. Andria Frames   History of Present Illness:  John Shields is a 72 y.o. male presenting with near syncope + diaphoresis since this afternoon. Patient was at [employer] and states his head felt like  his "brain is falling in  water" and felt like "my brain was in jello". States that when he is sitting or laying down he feels fine, but when standing he feels unbalanced. Patient states that he has these episodes "every so often" since having orchiectomy 5-6 months ago, stating they happen once every couple of months. Patient normally lives alone and does ADLs alone, including driving a car, but needed to walk to [employer] today (25 min walk) due to a broken down car.   In ED orthostatic vitals were negative. CTA was negative for PE. CXR negative.   Review Of Systems: Per HPI with the following additions:   Review of Systems  Constitutional: Positive for diaphoresis. Negative for malaise/fatigue and weight loss.  HENT: Negative for ear pain and tinnitus.   Eyes: Negative for blurred vision and double vision.  Respiratory: Positive for shortness of breath. Negative for cough, hemoptysis, sputum production and wheezing.   Cardiovascular: Negative for chest pain and palpitations.  Gastrointestinal: Negative for abdominal pain, blood in stool, constipation, diarrhea, nausea and vomiting.  Genitourinary: Negative for hematuria.  Musculoskeletal: Positive for joint pain. Negative for falls, myalgias and neck pain.  Neurological: Positive for dizziness, weakness and headaches. Negative for tingling, tremors, focal weakness, seizures and loss of consciousness.  Endo/Heme/Allergies: Does not bruise/bleed easily.    Patient Active Problem List   Diagnosis Date Noted  . Near syncope 07/25/2017  . BPH (benign prostatic hyperplasia) 12/27/2016  . Renal mass 12/27/2016  . Thrombocytopenia ([place]) 12/27/2016  . Sepsis ([place]) 12/27/2016  . Elevated LFTs   . Lactic acidosis   . AKI (acute kidney injury) ([place])   . Urinary retention 06/07/2016  . Constipation 06/07/2016  . Acute renal failure (ARF) ([place]) 06/07/2016  . Essential hypertension 06/07/2016  . Hyperglycemia 06/07/2016    Past Medical  History: Past Medical History:  Diagnosis Date  . Foley catheter in place   . History of sepsis 12/27/2016   sepsis from prostate abscess post prostate bx  . Hypertension   . LBBB (left bundle branch block)   . Prostate cancer, primary, 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 [facility]) urologist-  dr Tresa Moore   dx 07/ 2018 bx-- Gleason 8, PSA 400, vol 313ml-- abd. lymphadenopathy  . Renal cancer, right ([place])   . Thrombocytopenia ([place])   . Urine retention    intermittant since 2016    Past Surgical History: Past Surgical History:  Procedure Laterality Date  . NO PAST SURGERIES    . ORCHIECTOMY Bilateral 01/31/2017   Procedure: ORCHIECTOMY;  Surgeon: Alexis Frock, MD;  [facility];  Service: Urology;  Laterality: Bilateral;    Social History: Social History   Tobacco Use  . Smoking status: Never Smoker  . Smokeless tobacco: Never Used  Substance Use Topics  . Alcohol use: No  . Drug use: No   Additional social history: Lives alone, denies alcohol, no tobacco use, no illicit drug use  Please also refer to relevant sections of EMR.  Family History: Family History  Problem Relation Age of Onset  . Heart attack Father   . Heart attack Brother   Sister had history of "Balance" issues   Allergies and Medications: No Known Allergies No current facility-administered medications on file prior to encounter.    Current Outpatient Medications on File Prior to Encounter  Medication Sig Dispense Refill  . amLODipine (NORVASC) 5 MG tablet Take 1 tablet (5 mg total) by mouth daily. 30 tablet 0  . lisinopril (PRINIVIL,ZESTRIL) 20 MG  tablet Take 1 tablet (20 mg total) by mouth daily. 30 tablet 0  . metoprolol succinate (TOPROL-XL) 25 MG 24 hr tablet Take 1 tablet (25 mg total) by mouth daily. (Patient taking differently: Take 12.5 mg by mouth 2 (two) times daily. ) 30 tablet 0  . Multiple Vitamin (MULTIVITAMIN) tablet Take 1 tablet by mouth daily.    .  polyethylene glycol (MIRALAX / GLYCOLAX) packet Take 17 g by mouth daily. (Patient not taking: Reported on 03/07/2017) 14 each 0  . tamsulosin (FLOMAX) 0.4 MG CAPS capsule Take 0.4 mg by mouth daily.    . traMADol (ULTRAM) 50 MG tablet Take 1-2 tablets (50-100 mg total) by mouth every 6 (six) hours as needed for moderate pain or severe pain. Post-operatively. (Patient not taking: Reported on 03/07/2017) 30 tablet 0    Objective: BP 131/88   Pulse (!) 102   Temp 97.7 F (36.5 C) (Oral)   Resp 17   Ht 6' (1.829 m)   Wt 205 lb (93 kg)   SpO2 97%   BMI 27.80 kg/m  Exam: General: awake and alert, laying in bed, NAD watching television  Eyes: PERRL, EOMI ENTM: moist mucous membranes  Neck: supple, no LAD  Cardiovascular: RRR, no MRG Respiratory: CTAB, no wheezes, rales, or rhonchi  Gastrointestinal: soft, non tender, non distended, bowel sounds normal x4 quadrants  MSK: no edema, non tender, 5/5 muscle strength bilaterally in upper and lower extremities  Derm: seborrheic keratosis noted on back  Neuro: CN2-12 grossly intact, no focal deficits, sensation intact bilaterally, no finger to nose dysmetria Psych: normal affect   Labs and Imaging: CBC BMET  Recent Labs  Lab 07/25/17 1057 07/25/17 1119  WBC 5.4  --   HGB 13.7 16.0  HCT 41.7 47.0  PLT 169  --    Recent Labs  Lab 07/25/17 1057 07/25/17 1119  NA 138 140  K 3.8 3.8  CL 102 100*  CO2 25  --   BUN 16 16  CREATININE 1.25* 1.20  GLUCOSE 150* 141*  CALCIUM 9.7  --       Ref. Range 07/25/2017 11:16  Troponin i, poc Latest Ref Range: 0.00 - 0.08 ng/mL 0.00    Ref. Range 07/25/2017 10:57  D-Dimer, Quant Latest Ref Range: 0.00 - 0.50 ug/mL-FEU 3.18 (H)   Dg Chest 2 View  Result Date: 07/25/2017 CLINICAL DATA:  Shortness of breath EXAM: CHEST  2 VIEW COMPARISON:  12/28/2016 FINDINGS: The lungs are clear without focal pneumonia, edema, pneumothorax or pleural effusion. Cardiopericardial silhouette is at upper limits of  normal for size. The visualized bony structures of the thorax are intact. Telemetry leads overlie the chest. IMPRESSION: No active cardiopulmonary disease. Electronically Signed   By: Misty Stanley M.D.   On: 07/25/2017 11:47   Ct Head Wo Contrast  Result Date: 07/25/2017 CLINICAL DATA:  Near syncopal episode, dizziness EXAM: CT HEAD WITHOUT CONTRAST TECHNIQUE: Contiguous axial images were obtained from the base of the skull through the vertex without intravenous contrast. COMPARISON:  CT brain 03/07/2017 FINDINGS: Brain: No acute territorial infarction, hemorrhage, or intracranial mass is visualized. Mild small vessel ischemic changes of the white matter. Mild to moderate atrophy. Stable mildly prominent ventricle size. Vascular: No hyperdense vessels. Scattered calcifications at the carotid siphons. Skull: Normal. Negative for fracture or focal lesion. Sinuses/Orbits: Mucosal thickening in the ethmoid and maxillary sinuses. Small mucous retention cyst in the left maxillary sinus. Other: None IMPRESSION: 1. No CT evidence for acute intracranial abnormality. 2.  Atrophy and mild small vessel ischemic changes of the white matter Electronically Signed   By: Donavan Foil M.D.   On: 07/25/2017 14:44   Ct Angio Chest Pe W/cm &/or Wo Cm  Result Date: 07/25/2017 CLINICAL DATA:  Prostate cancer, shortness of breath, elevated D-dimer, intermediate clinical suspicion of pulmonary embolism, history hypertension EXAM: CT ANGIOGRAPHY CHEST WITH CONTRAST TECHNIQUE: Multidetector CT imaging of the chest was performed using the standard protocol during bolus administration of intravenous contrast. Multiplanar CT image reconstructions and MIPs were obtained to evaluate the vascular anatomy. CONTRAST:  159mL ISOVUE-370 IOPAMIDOL (ISOVUE-370) INJECTION 76% IV COMPARISON:  12/23/2016 FINDINGS: Cardiovascular: Minimal atherosclerotic calcification aorta. Minimally dilated ascending thoracic aorta 4.0 cm diameter. No evidence of  aortic dissection. Heart unremarkable. No pericardial effusion. Pulmonary arteries well opacified and patent. No evidence of pulmonary embolism. Mediastinum/Nodes: Esophagus unremarkable. Base of cervical region normal appearance. No thoracic adenopathy. Lungs/Pleura: 4 mm subpleural nodule RIGHT middle lobe image 51. 2 mm LEFT upper lobe nodule image 47. No acute infiltrate, pleural effusion or pneumothorax. Upper Abdomen: Cyst at upper pole RIGHT kidney 3.6 x 3.6 cm image 91. Adrenal thickening without mass. Remaining visualized upper abdomen unremarkable. Musculoskeletal: Scattered sclerotic foci within thoracic vertebra, sternum, BILATERAL ribs, and RIGHT scapula consistent with osseous metastases. Review of the MIP images confirms the above findings. IMPRESSION: No evidence of pulmonary embolism. Scattered small sclerotic osseous foci suspicious for osseous metastases from prostate cancer. Small nonspecific BILATERAL pulmonary nodules. RIGHT renal cyst. 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 Recommend annual imaging followup by CTA or MRA. This recommendation follows 2010 ACCF/AHA/AATS/ACR/ASA/SCA/SCAI/SIR/STS/SVM Guidelines for the Diagnosis and Management of Patients with Thoracic Aortic Disease. Circulation. 2010; 121: I016-P537 Aortic Atherosclerosis (ICD10-I70.0). Aortic aneurysm NOS (ICD10-I71.9). Electronically Signed   By: Lavonia Dana M.D.   On: 07/25/2017 13:09    Caroline More, DO 07/25/2017, 4:14 PM PGY-1, [facility] pager: [phone], text pages welcome  FPTS Upper-Level Resident Addendum  I have independently interviewed and examined the patient. I have discussed the above with the original author and agree with their documentation. My edits for correction/addition/clarification are in blue. Please see also any attending notes.   Hyman Bible, MD PGY-3, [place] Service pager:  [phone] (text pages welcome through [place])

## 2017-07-25 NOTE — ED Notes (Signed)
Pt ambulatory to restroom with steady gait with +2 assist

## 2017-07-25 NOTE — ED Notes (Signed)
Phlebotomy called for lab draw 

## 2017-07-25 NOTE — ED Notes (Signed)
Labs collected by phlebotomist. Unable to click off due to not being visible on phlebotomist screen. This RN collected labs in system. Sent to main lab

## 2017-07-25 NOTE — ED Notes (Signed)
[facility] Lunch tray ordered @ 1430-per RN-called by Levada Dy

## 2017-07-25 NOTE — Progress Notes (Signed)
I saw and examined Mr. Medinger.  Discussed with Drs Tammi Klippel and [place].  Agree with their documentation and management.  I will co-sign the H&PE when available. Briefly, 72 yo male with known HBP, metastatic prostate CA, urinary retention (in-dwelling foley) presents with another near syncope event.  He states he has had many of these since his bilateral orchiectomy ~10 months ago.  Not the best historian.  Describes events as "jello headed".  These spells are more likely to come on with exertion.  He also endorses DOE.  Today, spell was after much more exertion than normal and accompanied by DOE and diaphoresis.  Has been having diaphoretic random spells since orchiectomy.  Issues: 1. Near syncope:  Broad diff dx. Cardiac - EKG shows complete LBBB so arrythmia or heart block is possible.  Also, with DOE, CHF is likely.  Diaphoresis suggests possibility of angina equivalent. Pulm - I am glad PE has been ruled out.  Metastatic ca and syncope made that an important dx to exclude. Medication - on multiple HBP meds plus flomax.  He is unclear of what meds he is taking.   2. DOE - R/O CHF.  Might be just deconditioning with his known metastatic Ca. 3. Diaphoresis. A common side effect post orchiectomy but I am concerned that it seemed to be part of his spell today (Triad of dyspnea, lightheadedness and diaphoresis.)  He has enough indicators that I feel a cardiac evaluation is in order.  Admit with tele.  Serial trops.  Await good med rec by pharmacy.

## 2017-07-25 NOTE — ED Notes (Signed)
Attempted to ambulate pt.  Pt able to stand and take several steps to edge of bed, but stated that his head was "still felt full of jelly" and he felt unsafe to walk.  HR increased slightly to 106 when standing.  Pt also c/o joint pain to LE.

## 2017-07-25 NOTE — ED Notes (Signed)
Dinner tray ordered.

## 2017-07-25 NOTE — ED Notes (Signed)
Attempted report x1. 

## 2017-07-25 NOTE — ED Notes (Signed)
PT states no increase in dizziness when changing position.  States "my head still feels like jello".

## 2017-07-25 NOTE — Progress Notes (Signed)
Attempted to get report.  No answer.  Will try again.  Earleen Reaper RN-BC, [employer]

## 2017-07-25 NOTE — ED Provider Notes (Signed)
[place] EMERGENCY DEPARTMENT Provider Note   CSN: [id number] Arrival date & time: 07/25/17  1033     History   Chief Complaint Chief Complaint  Patient presents with  . Near Syncope    HPI John Shields is a 72 y.o. male.  The history is provided by the patient and the EMS personnel. No language interpreter was used.  Near Syncope    John Shields is a 72 y.o. male who presents to the Emergency Department complaining of near syncope.  Presents via EMS for evaluation of near syncope.  He walked to [employer] this morning because his car was broken down.  He felt well during the walk.  While he was shopping he began to feel diaphoresis and lightheadedness and felt as if he might pass out.  He went to sit down and 911 was called.  He denies any headache, chest pain, abdominal pain, shortness of breath, nausea, vomiting.  He does have a history of orchiectomy and does develop recurrent diaphoresis intermittently but not dizziness.  He has an indwelling catheter in place and it was last changed a few weeks ago.  No change in urine quality.  He has no history of blood clots.  No lower extremity edema or pain. Past Medical History:  Diagnosis Date  . Foley catheter in place   . History of sepsis 12/27/2016   sepsis from prostate abscess post prostate bx  . Hypertension   . LBBB (left bundle branch block)   . Prostate cancer, primary, 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 [facility]) urologist-  dr Tresa Moore   dx 07/ 2018 bx-- Gleason 8, PSA 400, vol 361ml-- abd. lymphadenopathy  . Renal cancer, right ([place])   . Thrombocytopenia ([place])   . Urine retention    intermittant since 2016    Patient Active Problem List   Diagnosis Date Noted  . Near syncope 07/25/2017  . BPH (benign prostatic hyperplasia) 12/27/2016  . Renal mass 12/27/2016  . Thrombocytopenia ([place]) 12/27/2016  . Sepsis ([place]) 12/27/2016  . Elevated LFTs   . Lactic acidosis   . AKI (acute kidney  injury) ([place])   . Urinary retention 06/07/2016  . Constipation 06/07/2016  . Acute renal failure (ARF) ([place]) 06/07/2016  . Essential hypertension 06/07/2016  . Hyperglycemia 06/07/2016    Past Surgical History:  Procedure Laterality Date  . NO PAST SURGERIES    . ORCHIECTOMY Bilateral 01/31/2017   Procedure: ORCHIECTOMY;  Surgeon: Alexis Frock, MD;  [facility];  Service: Urology;  Laterality: Bilateral;       Home Medications    Prior to Admission medications   Medication Sig Start Date End Date Taking? Authorizing Provider  amLODipine (NORVASC) 5 MG tablet Take 1 tablet (5 mg total) by mouth daily. 01/03/17 03/07/17  Johnson, Clanford L, MD  lisinopril (PRINIVIL,ZESTRIL) 20 MG tablet Take 1 tablet (20 mg total) by mouth daily. 01/03/17 03/07/17  Irwin Brakeman L, MD  metoprolol succinate (TOPROL-XL) 25 MG 24 hr tablet Take 1 tablet (25 mg total) by mouth daily. Patient taking differently: Take 12.5 mg by mouth 2 (two) times daily.  01/03/17 03/07/17  Murlean Iba, MD  Multiple Vitamin (MULTIVITAMIN) tablet Take 1 tablet by mouth daily.    [provider]  polyethylene glycol (MIRALAX / GLYCOLAX) packet Take 17 g by mouth daily. Patient not taking: Reported on 03/07/2017 06/10/16   Modena Jansky, MD  tamsulosin (FLOMAX) 0.4 MG CAPS capsule Take 0.4 mg by  mouth daily.    [provider]  traMADol (ULTRAM) 50 MG tablet Take 1-2 tablets (50-100 mg total) by mouth every 6 (six) hours as needed for moderate pain or severe pain. Post-operatively. Patient not taking: Reported on 03/07/2017 01/31/17 01/31/18  Alexis Frock, MD    Family History Family History  Problem Relation Age of Onset  . Heart attack Father   . Heart attack Brother     Social History Social History   Tobacco Use  . Smoking status: Never Smoker  . Smokeless tobacco: Never Used  Substance Use Topics  . Alcohol use: No  . Drug use: No     Allergies     Patient has no known allergies.   Review of Systems Review of Systems  Cardiovascular: Positive for near-syncope.  All other systems reviewed and are negative.    Physical Exam Updated Vital Signs BP (!) 146/90   Pulse 94   Temp 97.7 F (36.5 C) (Oral)   Resp 16   Ht 6' (1.829 m)   Wt 93 kg (205 lb)   SpO2 100%   BMI 27.80 kg/m   Physical Exam  Constitutional: He is oriented to person, place, and time. He appears well-developed and well-nourished.  HENT:  Head: Normocephalic and atraumatic.  Cardiovascular: Normal rate and regular rhythm.  No murmur heard. Pulmonary/Chest: Effort normal and breath sounds normal. No respiratory distress.  Abdominal: Soft. There is no tenderness. There is no rebound and no guarding.  Musculoskeletal: He exhibits no edema or tenderness.  Neurological: He is alert and oriented to person, place, and time. No cranial nerve deficit. Coordination normal.  5 out of 5 strength in all 4 extremities with sensation to light touch intact in all 4 extremities.  Visual fields grossly intact.  No ataxia on finger to nose bilaterally.  Skin: Skin is warm and dry.  Psychiatric: He has a normal mood and affect. His behavior is normal.  Nursing note and vitals reviewed.    ED Treatments / Results  Labs (all labs ordered are listed, but only abnormal results are displayed) Labs Reviewed  COMPREHENSIVE METABOLIC PANEL - Abnormal; Notable for the following components:      Result Value   Glucose, Bld 150 (*)    Creatinine, Ser 1.25 (*)    GFR calc non Af Amer 56 (*)    All other components within normal limits  CBC WITH DIFFERENTIAL/PLATELET - Abnormal; Notable for the following components:   MCV 73.2 (*)    MCH 24.0 (*)    All other components within normal limits  D-DIMER, QUANTITATIVE (NOT AT [facility]) - Abnormal; Notable for the following components:   D-Dimer, Quant 3.18 (*)    All other components within normal limits  CBG MONITORING, ED -  Abnormal; Notable for the following components:   Glucose-Capillary 147 (*)    All other components within normal limits  I-STAT CHEM 8, ED - Abnormal; Notable for the following components:   Chloride 100 (*)    Glucose, Bld 141 (*)    All other components within normal limits  I-STAT TROPONIN, ED    EKG  EKG Interpretation  Date/Time:  Wednesday July 25 2017 10:33:41 EST Ventricular Rate:  70 PR Interval:    QRS Duration: 153 QT Interval:  401 QTC Calculation: 433 R Axis:   -58 Text Interpretation:  Sinus rhythm Borderline prolonged PR interval Left bundle branch block Confirmed by Quintella Reichert [phone]) on 07/25/2017 10:36:53 AM  Radiology Dg Chest 2 View  Result Date: 07/25/2017 CLINICAL DATA:  Shortness of breath EXAM: CHEST  2 VIEW COMPARISON:  12/28/2016 FINDINGS: The lungs are clear without focal pneumonia, edema, pneumothorax or pleural effusion. Cardiopericardial silhouette is at upper limits of normal for size. The visualized bony structures of the thorax are intact. Telemetry leads overlie the chest. IMPRESSION: No active cardiopulmonary disease. Electronically Signed   By: Misty Stanley M.D.   On: 07/25/2017 11:47   Ct Head Wo Contrast  Result Date: 07/25/2017 CLINICAL DATA:  Near syncopal episode, dizziness EXAM: CT HEAD WITHOUT CONTRAST TECHNIQUE: Contiguous axial images were obtained from the base of the skull through the vertex without intravenous contrast. COMPARISON:  CT brain 03/07/2017 FINDINGS: Brain: No acute territorial infarction, hemorrhage, or intracranial mass is visualized. Mild small vessel ischemic changes of the white matter. Mild to moderate atrophy. Stable mildly prominent ventricle size. Vascular: No hyperdense vessels. Scattered calcifications at the carotid siphons. Skull: Normal. Negative for fracture or focal lesion. Sinuses/Orbits: Mucosal thickening in the ethmoid and maxillary sinuses. Small mucous retention cyst in the left maxillary  sinus. Other: None IMPRESSION: 1. No CT evidence for acute intracranial abnormality. 2. Atrophy and mild small vessel ischemic changes of the white matter Electronically Signed   By: Donavan Foil M.D.   On: 07/25/2017 14:44   Ct Angio Chest Pe W/cm &/or Wo Cm  Result Date: 07/25/2017 CLINICAL DATA:  Prostate cancer, shortness of breath, elevated D-dimer, intermediate clinical suspicion of pulmonary embolism, history hypertension EXAM: CT ANGIOGRAPHY CHEST WITH CONTRAST TECHNIQUE: Multidetector CT imaging of the chest was performed using the standard protocol during bolus administration of intravenous contrast. Multiplanar CT image reconstructions and MIPs were obtained to evaluate the vascular anatomy. CONTRAST:  137mL ISOVUE-370 IOPAMIDOL (ISOVUE-370) INJECTION 76% IV COMPARISON:  12/23/2016 FINDINGS: Cardiovascular: Minimal atherosclerotic calcification aorta. Minimally dilated ascending thoracic aorta 4.0 cm diameter. No evidence of aortic dissection. Heart unremarkable. No pericardial effusion. Pulmonary arteries well opacified and patent. No evidence of pulmonary embolism. Mediastinum/Nodes: Esophagus unremarkable. Base of cervical region normal appearance. No thoracic adenopathy. Lungs/Pleura: 4 mm subpleural nodule RIGHT middle lobe image 51. 2 mm LEFT upper lobe nodule image 47. No acute infiltrate, pleural effusion or pneumothorax. Upper Abdomen: Cyst at upper pole RIGHT kidney 3.6 x 3.6 cm image 91. Adrenal thickening without mass. Remaining visualized upper abdomen unremarkable. Musculoskeletal: Scattered sclerotic foci within thoracic vertebra, sternum, BILATERAL ribs, and RIGHT scapula consistent with osseous metastases. Review of the MIP images confirms the above findings. IMPRESSION: No evidence of pulmonary embolism. Scattered small sclerotic osseous foci suspicious for osseous metastases from prostate cancer. Small nonspecific BILATERAL pulmonary nodules. RIGHT renal cyst. Aneurysmal dilatation  of the ascending thoracic aorta 4.0 cm diameter, recommendation below. Recommend annual imaging followup by CTA or MRA. This recommendation follows 2010 ACCF/AHA/AATS/ACR/ASA/SCA/SCAI/SIR/STS/SVM Guidelines for the Diagnosis and Management of Patients with Thoracic Aortic Disease. Circulation. 2010; 121: Z366-Y403 Aortic Atherosclerosis (ICD10-I70.0). Aortic aneurysm NOS (ICD10-I71.9). Electronically Signed   By: Lavonia Dana M.D.   On: 07/25/2017 13:09    Procedures Procedures (including critical care time)  Medications Ordered in ED Medications  sodium chloride 0.9 % bolus 500 mL (0 mLs Intravenous Stopped 07/25/17 1405)  iopamidol (ISOVUE-370) 76 % injection (100 mLs  Contrast Given 07/25/17 1224)     Initial Impression / Assessment and Plan / ED Course  I have reviewed the triage vital signs and the nursing notes.  Pertinent labs & imaging results that were available during my care of  the patient were reviewed by me and considered in my medical decision making (see chart for details).    Old records reviewed.  Patient had a CT abdomen pelvis in August 2018 with no evidence of abdominal aneurysm.  Patient here for evaluation of near syncopal episode that occurred earlier today.  He had no associated pain.  He is symptom-free at rest in the department but when he attempts to ambulate or stand he again feels lightheaded and unsteady.  No focal neurologic deficits on examination.  He did have a blood pressure of 774 systolic for EMS prior to ED arrival.  Providing IV fluids.  Medicine consulted for observation admission for near syncope.  No evidence of PE.  Presentation is not consistent with ACS, sepsis.  Final Clinical Impressions(s) / ED Diagnoses   Final diagnoses:  None    ED Discharge Orders    None       Quintella Reichert, MD 07/25/17 1531

## 2017-07-25 NOTE — Consult Note (Addendum)
Cardiology Consultation:   Patient ID: John Shields; [id number]; [birth year]   Admit date: 07/25/2017 Date of Consult: 07/25/2017  Primary Care Provider: [facility] Primary Cardiologist: New- John Shields Primary Electrophysiologist: None   Patient Profile:   John Shields is a 72 y.o. male being seen today for the evaluation of near syncope at the request of John Shields.  History of Present Illness:   John Shields is metastatic prostate cancer and is status post orchiectomy about 9 months ago.  Since that time he has not felt well.  He has frequent hot flashes.  He is also had the sensation that he describes as "my head is floating in jello".  He has not had syncope.  Today while shopping at [facility], this feeling was prolonged and more severe than previous episodes.  No associated chest discomfort, orthopnea, or PND.  Over the last 5-6 months he has noted dyspnea on exertion.  He denies palpitations.  He has never had true syncope.  No prior history of heart disease.  Does have history of essential hypertension, metastatic prostate cancer, and renal cell carcinoma.  Past Medical History:  Diagnosis Date  . Foley catheter in place   . History of sepsis 12/27/2016   sepsis from prostate abscess post prostate bx  . Hypertension   . LBBB (left bundle branch block)   . Prostate cancer, primary, 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 [facility]) urologist-  dr Tresa Moore   dx 07/ 2018 bx-- Gleason 8, PSA 400, vol 330ml-- abd. lymphadenopathy  . Renal cancer, right ([place])   . Thrombocytopenia ([place])   . Urine retention    intermittant since 2016    Past Surgical History:  Procedure Laterality Date  . NO PAST SURGERIES    . ORCHIECTOMY Bilateral 01/31/2017   Procedure: ORCHIECTOMY;  Surgeon: Alexis Frock, MD;  [facility];  Service: Urology;  Laterality: Bilateral;     Home Medications:  Prior to Admission medications   Medication Sig Start  Date End Date Taking? Authorizing Provider  amLODipine (NORVASC) 5 MG tablet Take 1 tablet (5 mg total) by mouth daily. 01/03/17 07/25/17 Yes Johnson, Clanford L, MD  aspirin EC 81 MG tablet Take 81 mg by mouth 2 (two) times daily.   Yes [provider]  lisinopril (PRINIVIL,ZESTRIL) 20 MG tablet Take 1 tablet (20 mg total) by mouth daily. 01/03/17 07/25/17 Yes Johnson, Clanford L, MD  metoprolol succinate (TOPROL-XL) 25 MG 24 hr tablet Take 1 tablet (25 mg total) by mouth daily. Patient taking differently: Take 12.5 mg by mouth 2 (two) times daily.  01/03/17 07/25/17 Yes Johnson, Clanford L, MD  Multiple Vitamin (MULTIVITAMIN) tablet Take 1 tablet by mouth daily.   Yes [provider]  tamsulosin (FLOMAX) 0.4 MG CAPS capsule Take 0.4 mg by mouth daily.   Yes [provider]  polyethylene glycol (MIRALAX / GLYCOLAX) packet Take 17 g by mouth daily. Patient not taking: Reported on 03/07/2017 06/10/16   Modena Jansky, MD  traMADol (ULTRAM) 50 MG tablet Take 1-2 tablets (50-100 mg total) by mouth every 6 (six) hours as needed for moderate pain or severe pain. Post-operatively. Patient not taking: Reported on 03/07/2017 01/31/17 01/31/18  Alexis Frock, MD    Inpatient Medications: Scheduled Meds: . amLODipine  5 mg Oral Daily  . aspirin EC  81 mg Oral BID  . enoxaparin (LOVENOX) injection  40 mg Subcutaneous Q24H  . lisinopril  20 mg Oral Daily  . metoprolol  tartrate  12.5 mg Oral BID  . multivitamin with minerals  1 tablet Oral Daily   Continuous Infusions:  PRN Meds: acetaminophen **OR** acetaminophen, polyethylene glycol  Allergies:   No Known Allergies  Social History:   Social History   Socioeconomic History  . Marital status: Married    Spouse name: Not on file  . Number of children: Not on file  . Years of education: Not on file  . Highest education level: Not on file  Social Needs  . Financial resource strain: Not on file  . Food insecurity - worry:  Not on file  . Food insecurity - inability: Not on file  . Transportation needs - medical: Not on file  . Transportation needs - non-medical: Not on file  Occupational History  . Not on file  Tobacco Use  . Smoking status: Never Smoker  . Smokeless tobacco: Never Used  Substance and Sexual Activity  . Alcohol use: No  . Drug use: No  . Sexual activity: Not Currently  Other Topics Concern  . Not on file  Social History Narrative  . Not on file    Family History:    Family History  Problem Relation Age of Onset  . Heart attack Father   . Heart attack Brother      ROS:  Please see the history of present illness.  Hot flashes, difficulty with balance, dyspnea on exertion, and weakness status post orchiectomy. All other ROS reviewed and negative.     Physical Exam/Data:   Vitals:   07/25/17 1500 07/25/17 1515 07/25/17 1600 07/25/17 1712  BP: 140/90 (!) 146/90 131/88 (!) 157/66  Pulse: 94 94 (!) 102 97  Resp: (!) 21 16 17 20   Temp:    98 F (36.7 C)  TempSrc:    Oral  SpO2: 98% 100% 97% 100%  Weight:      Height:        Intake/Output Summary (Last 24 hours) at 07/25/2017 1736 Last data filed at 07/25/2017 1405 Gross per 24 hour  Intake 500 ml  Output -  Net 500 ml   Filed Weights   07/25/17 1041  Weight: 205 lb (93 kg)   Body mass index is 27.8 kg/m.  General:  Well nourished, well developed, in no acute distress.  Clean shaven.  Absence of facial hair is notable. HEENT: normal Lymph: no adenopathy Neck: no JVD Endocrine:  No thryomegaly Vascular: No carotid bruits; FA pulses 2+ bilaterally without bruits  Cardiac:  normal S1, S2; RRR; no murmur.  Soft S4 gallop is audible. Lungs:  clear to auscultation bilaterally, no wheezing, rhonchi or rales  Abd: soft, nontender, no hepatomegaly  Ext: no edema Musculoskeletal:  No deformities, BUE and BLE strength normal and equal Skin: warm and dry  Neuro:  CNs 2-12 intact, no focal abnormalities noted Psych:   Normal affect   EKG:  The EKG was personally reviewed and demonstrates: Normal sinus rhythm, left axis deviation, and left bundle branch block. Telemetry:  Telemetry was personally reviewed and demonstrates: Sinus rhythm with wide QRS complex.  Relevant CV Studies: No cardiovascular studies available  Laboratory Data:  Chemistry Recent Labs  Lab 07/25/17 1057 07/25/17 1119  NA 138 140  K 3.8 3.8  CL 102 100*  CO2 25  --   GLUCOSE 150* 141*  BUN 16 16  CREATININE 1.25* 1.20  CALCIUM 9.7  --   GFRNONAA 56*  --   GFRAA >60  --   ANIONGAP 11  --  Recent Labs  Lab 07/25/17 1057  PROT 7.7  ALBUMIN 4.3  AST 23  ALT 19  ALKPHOS 54  BILITOT 0.6   Hematology Recent Labs  Lab 07/25/17 1057 07/25/17 1119  WBC 5.4  --   RBC 5.70  --   HGB 13.7 16.0  HCT 41.7 47.0  MCV 73.2*  --   MCH 24.0*  --   MCHC 32.9  --   RDW 14.5  --   PLT 169  --    Cardiac EnzymesNo results for input(s): TROPONINI in the last 168 hours.  Recent Labs  Lab 07/25/17 1116  TROPIPOC 0.00    BNPNo results for input(s): BNP, PROBNP in the last 168 hours.  DDimer  Recent Labs  Lab 07/25/17 1057  DDIMER 3.18*    Radiology/Studies:  Dg Chest 2 View  Result Date: 07/25/2017 CLINICAL DATA:  Shortness of breath EXAM: CHEST  2 VIEW COMPARISON:  12/28/2016 FINDINGS: The lungs are clear without focal pneumonia, edema, pneumothorax or pleural effusion. Cardiopericardial silhouette is at upper limits of normal for size. The visualized bony structures of the thorax are intact. Telemetry leads overlie the chest. IMPRESSION: No active cardiopulmonary disease. Electronically Signed   By: Misty Stanley M.D.   On: 07/25/2017 11:47   Ct Head Wo Contrast  Result Date: 07/25/2017 CLINICAL DATA:  Near syncopal episode, dizziness EXAM: CT HEAD WITHOUT CONTRAST TECHNIQUE: Contiguous axial images were obtained from the base of the skull through the vertex without intravenous contrast. COMPARISON:  CT brain  03/07/2017 FINDINGS: Brain: No acute territorial infarction, hemorrhage, or intracranial mass is visualized. Mild small vessel ischemic changes of the white matter. Mild to moderate atrophy. Stable mildly prominent ventricle size. Vascular: No hyperdense vessels. Scattered calcifications at the carotid siphons. Skull: Normal. Negative for fracture or focal lesion. Sinuses/Orbits: Mucosal thickening in the ethmoid and maxillary sinuses. Small mucous retention cyst in the left maxillary sinus. Other: None IMPRESSION: 1. No CT evidence for acute intracranial abnormality. 2. Atrophy and mild small vessel ischemic changes of the white matter Electronically Signed   By: Donavan Foil M.D.   On: 07/25/2017 14:44   Ct Angio Chest Pe W/cm &/or Wo Cm  Result Date: 07/25/2017 CLINICAL DATA:  Prostate cancer, shortness of breath, elevated D-dimer, intermediate clinical suspicion of pulmonary embolism, history hypertension EXAM: CT ANGIOGRAPHY CHEST WITH CONTRAST TECHNIQUE: Multidetector CT imaging of the chest was performed using the standard protocol during bolus administration of intravenous contrast. Multiplanar CT image reconstructions and MIPs were obtained to evaluate the vascular anatomy. CONTRAST:  165mL ISOVUE-370 IOPAMIDOL (ISOVUE-370) INJECTION 76% IV COMPARISON:  12/23/2016 FINDINGS: Cardiovascular: Minimal atherosclerotic calcification aorta. Minimally dilated ascending thoracic aorta 4.0 cm diameter. No evidence of aortic dissection. Heart unremarkable. No pericardial effusion. Pulmonary arteries well opacified and patent. No evidence of pulmonary embolism. Mediastinum/Nodes: Esophagus unremarkable. Base of cervical region normal appearance. No thoracic adenopathy. Lungs/Pleura: 4 mm subpleural nodule RIGHT middle lobe image 51. 2 mm LEFT upper lobe nodule image 47. No acute infiltrate, pleural effusion or pneumothorax. Upper Abdomen: Cyst at upper pole RIGHT kidney 3.6 x 3.6 cm image 91. Adrenal thickening  without mass. Remaining visualized upper abdomen unremarkable. Musculoskeletal: Scattered sclerotic foci within thoracic vertebra, sternum, BILATERAL ribs, and RIGHT scapula consistent with osseous metastases. Review of the MIP images confirms the above findings. IMPRESSION: No evidence of pulmonary embolism. Scattered small sclerotic osseous foci suspicious for osseous metastases from prostate cancer. Small nonspecific BILATERAL pulmonary nodules. RIGHT renal cyst. Aneurysmal dilatation of the  ascending thoracic aorta 4.0 cm diameter, recommendation below. Recommend annual imaging followup by CTA or MRA. This recommendation follows 2010 ACCF/AHA/AATS/ACR/ASA/SCA/SCAI/SIR/STS/SVM Guidelines for the Diagnosis and Management of Patients with Thoracic Aortic Disease. Circulation. 2010; 121: J941-D408 Aortic Atherosclerosis (ICD10-I70.0). Aortic aneurysm NOS (ICD10-I71.9). Electronically Signed   By: Lavonia Dana M.D.   On: 07/25/2017 13:09    Assessment and Plan:   1. Near syncope, etiology is uncertain.  He still does not feel right even though vital signs and other parameters are normal.  Check orthostatic blood pressures should be evaluated..  Telemetry to rule out bradycardia or tachycardia. 2. Left bundle branch block -observe for bradycardia.  If he does not experience episodes while on telemetry he may need to wear a monitor. 3. Dyspnea on exertion, rule out diastolic versus systolic heart failure.  Presence of left bundle branch block could signify that there is underlying systolic dysfunction. 4. Essential hypertension: Continue aggressive blood pressure control.  ACE inhibitor and beta-blocker therapy are appropriate given risk factors and the dilated aorta. 5. Metastatic prostate cancer, status post orchiectomy 6. Ascending aortic aneurysm, measuring 4 cm on CT 07/25/2017.Marland Kitchen No coronary calcification noted on CT.  No obvious acute issue from cardiac standpoint at this time.   For questions or  updates, please contact [facility] Please consult [email or web address] for contact info under Cardiology/STEMI.   Signed, Sinclair Grooms, MD  07/25/2017 5:36 PM

## 2017-07-25 NOTE — ED Triage Notes (Signed)
Pt here from [facility] after near-syncopal episode while walking in [employer].  Pt is diaphoretic, pale, hypotensive with bp of 103/60 laying.  Pt ao x 4, hr 76 LBB, cbg 139, rr 30.  Pt c/o dizziness, but denies any pain.  Hx of indwelling cath.

## 2017-07-25 NOTE — ED Notes (Signed)
Phlebotomy at bedside.

## 2017-07-26 ENCOUNTER — Encounter (HOSPITAL_COMMUNITY): Payer: Self-pay | Admitting: *Deleted

## 2017-07-26 ENCOUNTER — Other Ambulatory Visit: Payer: Self-pay

## 2017-07-26 ENCOUNTER — Observation Stay (HOSPITAL_COMMUNITY): Payer: Medicare Other

## 2017-07-26 DIAGNOSIS — R0602 Shortness of breath: Secondary | ICD-10-CM | POA: Diagnosis not present

## 2017-07-26 DIAGNOSIS — K5900 Constipation, unspecified: Secondary | ICD-10-CM | POA: Diagnosis not present

## 2017-07-26 DIAGNOSIS — N401 Enlarged prostate with lower urinary tract symptoms: Secondary | ICD-10-CM | POA: Diagnosis not present

## 2017-07-26 DIAGNOSIS — C61 Malignant neoplasm of prostate: Secondary | ICD-10-CM | POA: Diagnosis not present

## 2017-07-26 DIAGNOSIS — R0609 Other forms of dyspnea: Secondary | ICD-10-CM | POA: Diagnosis not present

## 2017-07-26 DIAGNOSIS — R55 Syncope and collapse: Secondary | ICD-10-CM | POA: Diagnosis not present

## 2017-07-26 DIAGNOSIS — C7901 Secondary malignant neoplasm of right kidney and renal pelvis: Secondary | ICD-10-CM | POA: Diagnosis not present

## 2017-07-26 DIAGNOSIS — R918 Other nonspecific abnormal finding of lung field: Secondary | ICD-10-CM | POA: Diagnosis not present

## 2017-07-26 DIAGNOSIS — I10 Essential (primary) hypertension: Secondary | ICD-10-CM | POA: Diagnosis not present

## 2017-07-26 DIAGNOSIS — I447 Left bundle-branch block, unspecified: Secondary | ICD-10-CM | POA: Diagnosis not present

## 2017-07-26 DIAGNOSIS — I712 Thoracic aortic aneurysm, without rupture: Secondary | ICD-10-CM | POA: Diagnosis not present

## 2017-07-26 DIAGNOSIS — Z5321 Procedure and treatment not carried out due to patient leaving prior to being seen by health care provider: Secondary | ICD-10-CM | POA: Diagnosis not present

## 2017-07-26 DIAGNOSIS — R339 Retention of urine, unspecified: Secondary | ICD-10-CM | POA: Diagnosis not present

## 2017-07-26 LAB — BASIC METABOLIC PANEL
ANION GAP: 10 (ref 5–15)
BUN: 14 mg/dL (ref 6–20)
CALCIUM: 9.4 mg/dL (ref 8.9–10.3)
CO2: 25 mmol/L (ref 22–32)
CREATININE: 1.01 mg/dL (ref 0.61–1.24)
Chloride: 103 mmol/L (ref 101–111)
Glucose, Bld: 96 mg/dL (ref 65–99)
Potassium: 4.1 mmol/L (ref 3.5–5.1)
Sodium: 138 mmol/L (ref 135–145)

## 2017-07-26 LAB — CBC
HCT: 38.9 % — ABNORMAL LOW (ref 39.0–52.0)
HEMOGLOBIN: 12.3 g/dL — AB (ref 13.0–17.0)
MCH: 23 pg — AB (ref 26.0–34.0)
MCHC: 31.6 g/dL (ref 30.0–36.0)
MCV: 72.7 fL — ABNORMAL LOW (ref 78.0–100.0)
PLATELETS: 182 10*3/uL (ref 150–400)
RBC: 5.35 MIL/uL (ref 4.22–5.81)
RDW: 14.5 % (ref 11.5–15.5)
WBC: 6.9 10*3/uL (ref 4.0–10.5)

## 2017-07-26 LAB — TROPONIN I
Troponin I: <0.03 ng/mL (ref <0.03)
Troponin I: <0.03 ng/mL (ref <0.03)

## 2017-07-26 NOTE — Care Management Note (Addendum)
Case Management Note  Patient Details  Name: John Shields MRN: [id number] Date of Birth: 30-Oct-1945  Subjective/Objective:   Admitted with near syncopal event   Action/Plan: PCP is with [facility].  Prior to admission patient lived at home with spouse.  NCM will continue to monitor for discharge transition needs.  Expected Discharge Date:  07/28/17               Expected Discharge Plan:   TBD  Discharge planning Services   CM  Status of Service:  In process, will continue to follow  Additional Comments: Call placed to [facility] at [name], LCSW of patient admission.  Patient John worker is [name] x [phone].    [name], RN  Nurse case manager [name] 07/26/2017, 9:21 AM

## 2017-07-26 NOTE — Progress Notes (Addendum)
Progress Note  Patient Name: John Shields Date of Encounter: 07/26/2017  Primary Cardiologist: [place].  Ayeza Therriault  Subjective   No sleep last evening.  Continued sensation of "head and Jell-O".  No chest pain or dyspnea.  Inpatient Medications    Scheduled Meds: . amLODipine  5 mg Oral Daily  . aspirin EC  81 mg Oral BID  . enoxaparin (LOVENOX) injection  40 mg Subcutaneous Q24H  . lisinopril  20 mg Oral Daily  . metoprolol tartrate  12.5 mg Oral BID  . multivitamin with minerals  1 tablet Oral Daily   Continuous Infusions:  PRN Meds: acetaminophen **OR** acetaminophen, polyethylene glycol   Vital Signs    Vitals:   07/25/17 2130 07/25/17 2200 07/25/17 2306 07/26/17 0456  BP: 129/81 120/82 138/88 (!) 155/86  Pulse: 83 91 87 75  Resp: 20 (!) 22 20 17   Temp:   98.1 F (36.7 C) 98 F (36.7 C)  TempSrc:   Oral Oral  SpO2: 98% 99% 100% 98%  Weight:   205 lb 0.1 oz (93 kg)   Height:   6' (1.829 m)     Intake/Output Summary (Last 24 hours) at 07/26/2017 0835 Last data filed at 07/26/2017 0600 Gross per 24 hour  Intake 740 ml  Output 200 ml  Net 540 ml   Filed Weights   07/25/17 1041 07/25/17 2306  Weight: 205 lb (93 kg) 205 lb 0.1 oz (93 kg)    Telemetry    Normal sinus rhythm- Personally Reviewed  ECG    Normal sinus rhythm with left bundle branch block, left axis deviation, and mild first-degree AV block.- Personally Reviewed  Physical Exam  No 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 GEN: No acute distress.   Neck: No JVD Cardiac: RRR, no murmurs, rubs.  An S4 gallop is audible. Respiratory: Clear to auscultation bilaterally. GI: Soft, nontender, non-distended  MS: No edema; No deformity. Neuro:  Nonfocal  Psych: Normal affect   Labs    Chemistry Recent Labs  Lab 07/25/17 1057 07/25/17 1119 07/26/17 0439  NA 138 140 138  K 3.8 3.8 4.1  CL 102 100* 103  CO2 25  --  25  GLUCOSE 150* 141* 96  BUN 16 16 14   CREATININE 1.25*  1.20 1.01  CALCIUM 9.7  --  9.4  PROT 7.7  --   --   ALBUMIN 4.3  --   --   AST 23  --   --   ALT 19  --   --   ALKPHOS 54  --   --   BILITOT 0.6  --   --   GFRNONAA 56*  --  >60  GFRAA >60  --  >60  ANIONGAP 11  --  10     Hematology Recent Labs  Lab 07/25/17 1057 07/25/17 1119 07/25/17 1745 07/26/17 0439  WBC 5.4  --   --  6.9  RBC 5.70  --  5.40 5.35  HGB 13.7 16.0  --  12.3*  HCT 41.7 47.0  --  38.9*  MCV 73.2*  --   --  72.7*  MCH 24.0*  --   --  23.0*  MCHC 32.9  --   --  31.6  RDW 14.5  --   --  14.5  PLT 169  --   --  182    Cardiac Enzymes Recent Labs  Lab 07/25/17 1745 07/25/17 2315 07/26/17 0439  TROPONINI <0.03 <0.03 <0.03  Recent Labs  Lab 07/25/17 1116  TROPIPOC 0.00     BNP Recent Labs  Lab 07/25/17 1745  BNP 9.4     DDimer  Recent Labs  Lab 07/25/17 1057  DDIMER 3.18*     Radiology    Dg Chest 2 View  Result Date: 07/25/2017 CLINICAL DATA:  Shortness of breath EXAM: CHEST  2 VIEW COMPARISON:  12/28/2016 FINDINGS: The lungs are clear without focal pneumonia, edema, pneumothorax or pleural effusion. Cardiopericardial silhouette is at upper limits of normal for size. The visualized bony structures of the thorax are intact. Telemetry leads overlie the chest. IMPRESSION: No active cardiopulmonary disease. Electronically Signed   By: Misty Stanley M.D.   On: 07/25/2017 11:47   Ct Head Wo Contrast  Result Date: 07/25/2017 CLINICAL DATA:  Near syncopal episode, dizziness EXAM: CT HEAD WITHOUT CONTRAST TECHNIQUE: Contiguous axial images were obtained from the base of the skull through the vertex without intravenous contrast. COMPARISON:  CT brain 03/07/2017 FINDINGS: Brain: No acute territorial infarction, hemorrhage, or intracranial mass is visualized. Mild small vessel ischemic changes of the white matter. Mild to moderate atrophy. Stable mildly prominent ventricle size. Vascular: No hyperdense vessels. Scattered calcifications at the  carotid siphons. Skull: Normal. Negative for fracture or focal lesion. Sinuses/Orbits: Mucosal thickening in the ethmoid and maxillary sinuses. Small mucous retention cyst in the left maxillary sinus. Other: None IMPRESSION: 1. No CT evidence for acute intracranial abnormality. 2. Atrophy and mild small vessel ischemic changes of the white matter Electronically Signed   By: Donavan Foil M.D.   On: 07/25/2017 14:44   Ct Angio Chest Pe W/cm &/or Wo Cm  Result Date: 07/25/2017 CLINICAL DATA:  Prostate cancer, shortness of breath, elevated D-dimer, intermediate clinical suspicion of pulmonary embolism, history hypertension EXAM: CT ANGIOGRAPHY CHEST WITH CONTRAST TECHNIQUE: Multidetector CT imaging of the chest was performed using the standard protocol during bolus administration of intravenous contrast. Multiplanar CT image reconstructions and MIPs were obtained to evaluate the vascular anatomy. CONTRAST:  178mL ISOVUE-370 IOPAMIDOL (ISOVUE-370) INJECTION 76% IV COMPARISON:  12/23/2016 FINDINGS: Cardiovascular: Minimal atherosclerotic calcification aorta. Minimally dilated ascending thoracic aorta 4.0 cm diameter. No evidence of aortic dissection. Heart unremarkable. No pericardial effusion. Pulmonary arteries well opacified and patent. No evidence of pulmonary embolism. Mediastinum/Nodes: Esophagus unremarkable. Base of cervical region normal appearance. No thoracic adenopathy. Lungs/Pleura: 4 mm subpleural nodule RIGHT middle lobe image 51. 2 mm LEFT upper lobe nodule image 47. No acute infiltrate, pleural effusion or pneumothorax. Upper Abdomen: Cyst at upper pole RIGHT kidney 3.6 x 3.6 cm image 91. Adrenal thickening without mass. Remaining visualized upper abdomen unremarkable. Musculoskeletal: Scattered sclerotic foci within thoracic vertebra, sternum, BILATERAL ribs, and RIGHT scapula consistent with osseous metastases. Review of the MIP images confirms the above findings. IMPRESSION: No evidence of  pulmonary embolism. Scattered small sclerotic osseous foci suspicious for osseous metastases from prostate cancer. Small nonspecific BILATERAL pulmonary nodules. RIGHT renal cyst. 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 Recommend annual imaging followup by CTA or MRA. This recommendation follows 2010 ACCF/AHA/AATS/ACR/ASA/SCA/SCAI/SIR/STS/SVM Guidelines for the Diagnosis and Management of Patients with Thoracic Aortic Disease. Circulation. 2010; 121: X902-I097 Aortic Atherosclerosis (ICD10-I70.0). Aortic aneurysm NOS (ICD10-I71.9). Electronically Signed   By: Lavonia Dana M.D.   On: 07/25/2017 13:09    Cardiac Studies   4 cm ascending aortic aneurysm, asymptomatic  Patient Profile     72 y.o. male with history of metastatic prostate cancer treated with orchiectomy, prior renal cell  carcinoma, essential hypertension, left bundle branch block, and recurring episodes of lightheadedness/"head swimming in Jell-O "since orchiectomy 9 months ago.  Assessment & Plan    1. Near syncope: No arrhythmia on telemetry.  The vague lightheaded feeling has been present intermittently throughout the night with the patient lying.  No bradycardia tachycardia noted.  I do not believe there would be a cardiac explanation for the patient's complaints. 2. Essential hypertension, with blood pressures that have been generally good.  I am unable to find any recorded orthostatic blood pressure data. 3. Left bundle branch block, chronic.  Awaiting echo to determine if there is LV systolic dysfunction. 4. Dyspnea on exertion in setting of left bundle branch block.  BNP is normal.  Doubt there will be any significant left ventricular dysfunction.  Echo will determine. 5. Ascending aortic aneurysm and aortic atherosclerosis.  Both are asymptomatic.  If no arrhythmia or excessive bradycardia is noted, beta-blocker therapy should be added given the aneurysm.  Other therapy can be  down titrated to avoid hypotension.    For questions or updates, please contact [place] Please consult [email or web address] for contact info under Cardiology/STEMI.      Signed, Sinclair Grooms, MD  07/26/2017, 8:35 AM

## 2017-07-26 NOTE — Progress Notes (Signed)
Discharge summary faxed to Juliann Pares at [facility] at [phone].  [name], BSN, RN Nurse case [place]

## 2017-07-26 NOTE — Progress Notes (Signed)
Patient has requested to leave AMA due to a family emergency. Form signed and in the chart. IV removed, and tele box returned. Patient is alert, oriented, and calm.

## 2017-07-26 NOTE — Discharge Summary (Signed)
[facility] Discharge Summary  Patient name: John Shields Medical record number: [id number] Date of birth: 07/03/1945 Age: 72 y.o. Gender: male Date of Admission: 07/25/2017  Date of Discharge: 07/26/2017 Admitting Physician: Zenia Resides, MD  Primary Care Provider: [facility] Consultants: Cardiology  Indication for Hospitalization: Near syncope  Discharge Diagnoses/Problem List:  Near syncope HTN Metastatic prostate cancer s/p bilateral orchiectomy Right renal cell carcinoma  Disposition: AMA  Discharge Condition: Stable  Discharge Exam:  General: NAD, well appearing CVS: RRR, no MRG Lungs: CTAB, no increased work of breathing Abdomen: Soft, nontender, nondistended MSK: No lower extremity edema, 2+ dp  Brief Hospital Course:  Cylus is a 72 year old male presenting to the ED with an episode of near syncope and diaphoresis with exertion. He endorses near syncope once every 2 months. Orthostatic vitals were negative. EKG showed LBBB that was not new. CTA chest was negative for PE. Near syncopal events were thought to be cardiac in etiology, given that that occurred with exertion. Neurological etiology thought to be less likely with negative CT head and non-focal neurological exam. Cardiology was consulted and recommended monitoring on telemetry. He did not have any abnormalities on telemetry and cardiology ultimately did not think that his near syncopal episodes were cardiac in nature. Troponins were negative x 3. EKG remained unchanged without any new ST/T wave changes.   Patient has metastatic prostate cancer and is s/p bilateral orchiectomy with chronic indwelling foley. He was taking Flomax, which seemed unnecessary with a chronic foley. Flomax could have been contributing to his symptoms, so it was stopped.  Issues for Follow Up:  1. Patient left AMA due to a family emergancy and did not complete full work up. Please follow up with  out patient echocardiogram, 30 day event monitor, and cardiology follow up.  2. Of note, patient CTA chest did show aneurysmal dilatation of the ascending thoracic aorta to 4.0cm and new found lower lung nodules. Patient will need repeat CTA or MRA in 1 year for monitoring. 3. We stopped patient's flomax, as he has a chronic indwelling foley and it may have been contributing to his near syncopal events.  Significant Procedures: None  Significant Labs and Imaging:  Recent Labs  Lab 07/25/17 1057 07/25/17 1119 07/26/17 0439  WBC 5.4  --  6.9  HGB 13.7 16.0 12.3*  HCT 41.7 47.0 38.9*  PLT 169  --  182   Recent Labs  Lab 07/25/17 1057 07/25/17 1119 07/26/17 0439  NA 138 140 138  K 3.8 3.8 4.1  CL 102 100* 103  CO2 25  --  25  GLUCOSE 150* 141* 96  BUN 16 16 14   CREATININE 1.25* 1.20 1.01  CALCIUM 9.7  --  9.4  ALKPHOS 54  --   --   AST 23  --   --   ALT 19  --   --   ALBUMIN 4.3  --   --    Troponin - <0.03x3 BNP - 9.4 Folate - 24.0 B12 - 654  Ref Range & Units 1d ago   Retic Ct Pct 0.4 - 3.1 % <0.4 Abnormally low    Comment: REPEATED TO VERIFY  RBC. 4.22 - 5.81 MIL/uL 5.40    Ref Range & Units 1d ago   Iron 45 - 182 ug/dL 63   TIBC 250 - 450 ug/dL 249 Abnormally low    Saturation Ratios 17.9 - 39.5 % 25   UIBC ug/dL 186  Ref Range & Units 1d ago   Ferritin 24 - 336 ng/mL 95    Dg Chest 2 View  Result Date: 07/25/2017 CLINICAL DATA:  Shortness of breath EXAM: CHEST  2 VIEW COMPARISON:  12/28/2016 FINDINGS: The lungs are clear without focal pneumonia, edema, pneumothorax or pleural effusion. Cardiopericardial silhouette is at upper limits of normal for size. The visualized bony structures of the thorax are intact. Telemetry leads overlie the chest. IMPRESSION: No active cardiopulmonary disease. Electronically Signed   By: Misty Stanley M.D.   On: 07/25/2017 11:47   Ct Head Wo Contrast  Result Date: 07/25/2017 CLINICAL DATA:  Near syncopal episode, dizziness  EXAM: CT HEAD WITHOUT CONTRAST TECHNIQUE: Contiguous axial images were obtained from the base of the skull through the vertex without intravenous contrast. COMPARISON:  CT brain 03/07/2017 FINDINGS: Brain: No acute territorial infarction, hemorrhage, or intracranial mass is visualized. Mild small vessel ischemic changes of the white matter. Mild to moderate atrophy. Stable mildly prominent ventricle size. Vascular: No hyperdense vessels. Scattered calcifications at the carotid siphons. Skull: Normal. Negative for fracture or focal lesion. Sinuses/Orbits: Mucosal thickening in the ethmoid and maxillary sinuses. Small mucous retention cyst in the left maxillary sinus. Other: None IMPRESSION: 1. No CT evidence for acute intracranial abnormality. 2. Atrophy and mild small vessel ischemic changes of the white matter Electronically Signed   By: Donavan Foil M.D.   On: 07/25/2017 14:44   Ct Angio Chest Pe W/cm &/or Wo Cm  Result Date: 07/25/2017 CLINICAL DATA:  Prostate cancer, shortness of breath, elevated D-dimer, intermediate clinical suspicion of pulmonary embolism, history hypertension EXAM: CT ANGIOGRAPHY CHEST WITH CONTRAST TECHNIQUE: Multidetector CT imaging of the chest was performed using the standard protocol during bolus administration of intravenous contrast. Multiplanar CT image reconstructions and MIPs were obtained to evaluate the vascular anatomy. CONTRAST:  164mL ISOVUE-370 IOPAMIDOL (ISOVUE-370) INJECTION 76% IV COMPARISON:  12/23/2016 FINDINGS: Cardiovascular: Minimal atherosclerotic calcification aorta. Minimally dilated ascending thoracic aorta 4.0 cm diameter. No evidence of aortic dissection. Heart unremarkable. No pericardial effusion. Pulmonary arteries well opacified and patent. No evidence of pulmonary embolism. Mediastinum/Nodes: Esophagus unremarkable. Base of cervical region normal appearance. No thoracic adenopathy. Lungs/Pleura: 4 mm subpleural nodule RIGHT middle lobe image 51. 2 mm  LEFT upper lobe nodule image 47. No acute infiltrate, pleural effusion or pneumothorax. Upper Abdomen: Cyst at upper pole RIGHT kidney 3.6 x 3.6 cm image 91. Adrenal thickening without mass. Remaining visualized upper abdomen unremarkable. Musculoskeletal: Scattered sclerotic foci within thoracic vertebra, sternum, BILATERAL ribs, and RIGHT scapula consistent with osseous metastases. Review of the MIP images confirms the above findings. IMPRESSION: No evidence of pulmonary embolism. Scattered small sclerotic osseous foci suspicious for osseous metastases from prostate cancer. Small nonspecific BILATERAL pulmonary nodules. RIGHT renal cyst. 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 Recommend annual imaging followup by CTA or MRA. This recommendation follows 2010 ACCF/AHA/AATS/ACR/ASA/SCA/SCAI/SIR/STS/SVM Guidelines for the Diagnosis and Management of Patients with Thoracic Aortic Disease. Circulation. 2010; 121: I458-K998 Aortic Atherosclerosis (ICD10-I70.0). Aortic aneurysm NOS (ICD10-I71.9). Electronically Signed   By: Lavonia Dana M.D.   On: 07/25/2017 13:09      Results/Tests Pending at Time of Discharge: None  Discharge Medications:  Allergies as of 07/26/2017   No Known Allergies     Medication List    STOP taking these medications   tamsulosin 0.4 MG Caps capsule Commonly known as:  FLOMAX   traMADol 50 MG tablet Commonly known as:  Veatrice Bourbon  TAKE these medications   amLODipine 5 MG tablet Commonly known as:  NORVASC Take 1 tablet (5 mg total) by mouth daily.   aspirin EC 81 MG tablet Take 81 mg by mouth 2 (two) times daily.   lisinopril 20 MG tablet Commonly known as:  PRINIVIL,ZESTRIL Take 1 tablet (20 mg total) by mouth daily.   metoprolol succinate 25 MG 24 hr tablet Commonly known as:  TOPROL-XL Take 1 tablet (25 mg total) by mouth daily. What changed:    how much to take  when to take this   multivitamin  tablet Take 1 tablet by mouth daily.   polyethylene glycol packet Commonly known as:  MIRALAX / GLYCOLAX Take 17 g by mouth daily.       Discharge Instructions: Please refer to Patient Instructions section of EMR for full details.  Patient was counseled important signs and symptoms that should prompt return to medical care, changes in medications, dietary instructions, activity restrictions, and follow up appointments.   Follow-Up Appointments: Follow-up Information    Clinic, [place]. Schedule an appointment as soon as possible for a visit.   Contact information: [place] [address] [phone]           Bonnita Hollow, MD 07/26/2017, 1:53 PM PGY-1, [place]

## 2017-08-05 ENCOUNTER — Emergency Department (HOSPITAL_COMMUNITY): Payer: Medicare Other

## 2017-08-05 ENCOUNTER — Other Ambulatory Visit: Payer: Self-pay

## 2017-08-05 ENCOUNTER — Emergency Department (HOSPITAL_COMMUNITY)
Admission: EM | Admit: 2017-08-05 | Discharge: 2017-08-05 | Disposition: A | Discharge status: Home / Self Care | Payer: Medicare Other | Attending: Emergency Medicine | Admitting: Emergency Medicine

## 2017-08-05 ENCOUNTER — Encounter (HOSPITAL_COMMUNITY): Payer: Self-pay | Admitting: Emergency Medicine

## 2017-08-05 DIAGNOSIS — N3001 Acute cystitis with hematuria: Secondary | ICD-10-CM | POA: Diagnosis not present

## 2017-08-05 DIAGNOSIS — Z7982 Long term (current) use of aspirin: Secondary | ICD-10-CM | POA: Insufficient documentation

## 2017-08-05 DIAGNOSIS — R404 Transient alteration of awareness: Secondary | ICD-10-CM | POA: Diagnosis not present

## 2017-08-05 DIAGNOSIS — I10 Essential (primary) hypertension: Secondary | ICD-10-CM | POA: Insufficient documentation

## 2017-08-05 DIAGNOSIS — Z79899 Other long term (current) drug therapy: Secondary | ICD-10-CM | POA: Diagnosis not present

## 2017-08-05 DIAGNOSIS — R42 Dizziness and giddiness: Secondary | ICD-10-CM | POA: Diagnosis not present

## 2017-08-05 LAB — URINALYSIS, ROUTINE W REFLEX MICROSCOPIC
Bilirubin Urine: NEGATIVE
GLUCOSE, UA: NEGATIVE mg/dL
KETONES UR: NEGATIVE mg/dL
NITRITE: POSITIVE — AB
PH: 5 (ref 5.0–8.0)
Protein, ur: 100 mg/dL — AB
SPECIFIC GRAVITY, URINE: 1.015 (ref 1.005–1.030)
Squamous Epithelial / LPF: NONE SEEN

## 2017-08-05 LAB — CBC
HEMATOCRIT: 39.4 % (ref 39.0–52.0)
Hemoglobin: 12.4 g/dL — ABNORMAL LOW (ref 13.0–17.0)
MCH: 23.4 pg — ABNORMAL LOW (ref 26.0–34.0)
MCHC: 31.5 g/dL (ref 30.0–36.0)
MCV: 74.2 fL — ABNORMAL LOW (ref 78.0–100.0)
PLATELETS: 189 10*3/uL (ref 150–400)
RBC: 5.31 MIL/uL (ref 4.22–5.81)
RDW: 15.5 % (ref 11.5–15.5)
WBC: 6.3 10*3/uL (ref 4.0–10.5)

## 2017-08-05 LAB — BASIC METABOLIC PANEL
Anion gap: 11 (ref 5–15)
BUN: 16 mg/dL (ref 6–20)
CALCIUM: 9.3 mg/dL (ref 8.9–10.3)
CO2: 23 mmol/L (ref 22–32)
CREATININE: 0.91 mg/dL (ref 0.61–1.24)
Chloride: 106 mmol/L (ref 101–111)
GFR calc Af Amer: >60 mL/min (ref >60)
GLUCOSE: 124 mg/dL — AB (ref 65–99)
POTASSIUM: 4.3 mmol/L (ref 3.5–5.1)
SODIUM: 140 mmol/L (ref 135–145)

## 2017-08-05 MED ORDER — CEPHALEXIN 500 MG PO CAPS: 500.0000 mg | ORAL_CAPSULE | Freq: Four times a day (QID) | ORAL | 0 refills | Status: DC

## 2017-08-05 MED ORDER — MECLIZINE HCL 25 MG PO TABS: 25.0000 mg | ORAL_TABLET | Freq: Three times a day (TID) | ORAL | 0 refills | Status: DC | PRN

## 2017-08-05 MED ORDER — SODIUM CHLORIDE 0.9 % IV SOLN
INTRAVENOUS | Status: DC
  Administered 2017-08-05: 11:00:00 via INTRAVENOUS

## 2017-08-05 MED ORDER — MECLIZINE HCL 25 MG PO TABS
25.0000 mg | ORAL_TABLET | Freq: Once | ORAL | Status: AC
  Administered 2017-08-05: 25 mg via ORAL
  Filled 2017-08-05: qty 1

## 2017-08-05 MED ORDER — SODIUM CHLORIDE 0.9 % IV SOLN
1.0000 g | Freq: Once | INTRAVENOUS | Status: AC
  Administered 2017-08-05: 1 g via INTRAVENOUS
  Filled 2017-08-05: qty 10

## 2017-08-05 NOTE — ED Notes (Signed)
Patient transported to MRI 

## 2017-08-05 NOTE — ED Provider Notes (Signed)
[facility] EMERGENCY DEPARTMENT Provider Note   CSN: [id number] Arrival date & time: 08/05/17  0715     History   Chief Complaint Chief Complaint  Patient presents with  . Dizziness    HPI John Shields is a 72 y.o. male.  Patient with an admission February 27 - February 28 but he did leave AMA before the admission for near syncopal workup was complete.  Patient actually returning today coming in by EMS.  Patient awoke this morning with room spinning.  When you talk to limits actually some of the symptoms he has been having over the last several days to weeks.  It is brief in nature and then resolves.  Comes on unexpectedly.  Not constant in nature.  Patient denies any chest pain or shortness of breath.  Denies any syncopal episodes.  Patient's followed by the [facility] clinic in [place].  Patient did have a head CT with his last admission no acute findings.  Patient has a history of a long-standing indwelling Foley catheter.  This is secondary to his treatment for prostate cancer.      Past Medical History:  Diagnosis Date  . Foley catheter in place   . History of sepsis 12/27/2016   sepsis from prostate abscess post prostate bx  . Hypertension   . LBBB (left bundle branch block)   . Prostate cancer, primary, 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 [facility]) urologist-  dr Tresa Moore   dx 07/ 2018 bx-- Gleason 8, PSA 400, vol 373ml-- abd. lymphadenopathy  . Renal cancer, right ([place])   . Thrombocytopenia ([place])   . Urine retention    intermittant since 2016    Patient Active Problem List   Diagnosis Date Noted  . Near syncope 07/25/2017  . Prostate cancer metastatic to multiple sites [facility])   . Left bundle branch block (LBBB) on electrocardiogram   . H/O bilateral orchiectomies   . Dyspnea on exertion   . BPH (benign prostatic hyperplasia) 12/27/2016  . Renal mass 12/27/2016  . Thrombocytopenia ([place]) 12/27/2016  . Sepsis ([place]) 12/27/2016  . Elevated LFTs     . Lactic acidosis   . AKI (acute kidney injury) ([place])   . Urinary retention 06/07/2016  . Constipation 06/07/2016  . Acute renal failure (ARF) ([place]) 06/07/2016  . Essential hypertension 06/07/2016  . Hyperglycemia 06/07/2016    Past Surgical History:  Procedure Laterality Date  . NO PAST SURGERIES    . ORCHIECTOMY Bilateral 01/31/2017   Procedure: ORCHIECTOMY;  Surgeon: Alexis Frock, MD;  Location: [facility];  Service: Urology;  Laterality: Bilateral;       Home Medications    Prior to Admission medications   Medication Sig Start Date End Date Taking? Authorizing Provider  amLODipine (NORVASC) 5 MG tablet Take 1 tablet (5 mg total) by mouth daily. 01/03/17 08/05/17 Yes Johnson, Clanford L, MD  aspirin EC 81 MG tablet Take 81 mg by mouth 2 (two) times daily.   Yes [provider]  lisinopril (PRINIVIL,ZESTRIL) 20 MG tablet Take 1 tablet (20 mg total) by mouth daily. 01/03/17 08/05/17 Yes Johnson, Clanford L, MD  metoprolol tartrate (LOPRESSOR) 25 MG tablet Take 25 mg by mouth daily.   Yes [provider]  tamsulosin (FLOMAX) 0.4 MG CAPS capsule Take 0.4 mg by mouth.   Yes [provider]  cephALEXin (KEFLEX) 500 MG capsule Take 1 capsule (500 mg total) by mouth 4 (four) times daily. 08/05/17   Fredia Sorrow, MD  meclizine (  ANTIVERT) 25 MG tablet Take 1 tablet (25 mg total) by mouth 3 (three) times daily as needed for dizziness. 08/05/17   Fredia Sorrow, MD  metoprolol succinate (TOPROL-XL) 25 MG 24 hr tablet Take 1 tablet (25 mg total) by mouth daily. Patient taking differently: Take 12.5 mg by mouth 2 (two) times daily.  01/03/17 07/25/17  Johnson, Clanford L, MD  polyethylene glycol (MIRALAX / GLYCOLAX) packet Take 17 g by mouth daily. Patient not taking: Reported on 03/07/2017 06/10/16   Modena Jansky, MD    Family History Family History  Problem Relation Age of Onset  . Heart attack Father   . Heart attack Brother     Social  History Social History   Tobacco Use  . Smoking status: Never Smoker  . Smokeless tobacco: Never Used  Substance Use Topics  . Alcohol use: No  . Drug use: No     Allergies   Patient has no known allergies.   Review of Systems Review of Systems  Constitutional: Negative for fatigue and fever.  HENT: Negative for congestion.   Eyes: Negative for redness.  Respiratory: Negative for shortness of breath.   Cardiovascular: Negative for chest pain.  Gastrointestinal: Negative for abdominal pain and vomiting.  Genitourinary: Negative for dysuria.  Musculoskeletal: Negative for myalgias.  Skin: Negative for rash.  Neurological: Positive for dizziness. Negative for syncope and headaches.  Hematological: Does not bruise/bleed easily.  Psychiatric/Behavioral: Negative for confusion.     Physical Exam Updated Vital Signs BP (!) 149/97   Pulse 91   Temp 97.9 F (36.6 C) (Oral)   Resp 18   Ht 1.829 m (6')   Wt 93 kg (205 lb)   SpO2 100%   BMI 27.80 kg/m   Physical Exam  Constitutional: He is oriented to person, place, and time. He appears well-developed and well-nourished. No distress.  HENT:  Head: Normocephalic and atraumatic.  Mouth/Throat: Oropharynx is clear and moist.  Eyes: Conjunctivae and EOM are normal. Pupils are equal, round, and reactive to light.  Neck: Normal range of motion. Neck supple.  Cardiovascular: Normal rate, regular rhythm and normal heart sounds.  Pulmonary/Chest: Effort normal and breath sounds normal. No respiratory distress.  Abdominal: Soft. Bowel sounds are normal.  Musculoskeletal: Normal range of motion. He exhibits no edema.  Neurological: He is alert and oriented to person, place, and time. No cranial nerve deficit or sensory deficit. He exhibits normal muscle tone. Coordination normal.  No reproducible vertigo  Skin: Skin is warm.  Nursing note and vitals reviewed.    ED Treatments / Results  Labs (all labs ordered are listed,  but only abnormal results are displayed) Labs Reviewed  BASIC METABOLIC PANEL - Abnormal; Notable for the following components:      Result Value   Glucose, Bld 124 (*)    All other components within normal limits  CBC - Abnormal; Notable for the following components:   Hemoglobin 12.4 (*)    MCV 74.2 (*)    MCH 23.4 (*)    All other components within normal limits  URINALYSIS, ROUTINE W REFLEX MICROSCOPIC - Abnormal; Notable for the following components:   APPearance HAZY (*)    Hgb urine dipstick LARGE (*)    Protein, ur 100 (*)    Nitrite POSITIVE (*)    Leukocytes, UA LARGE (*)    Bacteria, UA MANY (*)    All other components within normal limits  URINE CULTURE    EKG  EKG Interpretation  Date/Time:  "Sunday August 05 2017 07:25:07 EDT Ventricular Rate:  82 PR Interval:  192 QRS Duration: 138 QT Interval:  370 QTC Calculation: 432 R Axis:   -57 Text Interpretation:  Normal sinus rhythm Left ventricular hypertrophy with QRS widening Cannot rule out Septal infarct , age undetermined Abnormal ECG ? LBBB Confirmed by ,  ([id number]) on 08/05/2017 10:21:26 AM       Radiology Mr Brain Wo Contrast (neuro Protocol)  Result Date: 08/05/2017 CLINICAL DATA:  Dizziness. Ataxia. History of prostate and renal cancer. EXAM: MRI HEAD WITHOUT CONTRAST TECHNIQUE: Multiplanar, multiecho pulse sequences of the brain and surrounding structures were obtained without intravenous contrast. COMPARISON:  Head CT 07/25/2017 FINDINGS: Brain: There is no evidence of acute infarct, mass, midline shift, or extra-axial fluid collection. There is mild cerebral atrophy. There are innumerable chronic microhemorrhages in the cerebellum and brainstem as well as parasagittal frontal and parietal lobes. A few additional chronic microhemorrhages are noted in the temporal and occipital lobes and left external capsule/posterior lentiform nucleus region. T2 hyperintensities in the periventricular greater than  subcortical cerebral white matter and pons are nonspecific but compatible with mild-to-moderate chronic small vessel ischemic disease. Chronic lacunar infarcts are present in the right pons, bilateral basal ganglia, left external capsule/corona radiata, and left splenium of the corpus callosum/periatrial white matter. There is moderate cerebral atrophy. Vascular: Major intracranial vascular flow voids are preserved. Skull and upper cervical spine: Unremarkable bone marrow signal. Sinuses/Orbits: Unremarkable orbits. Minimal scattered paranasal sinus mucosal thickening. Clear mastoid air cells. Other: None. IMPRESSION: 1. No acute intracranial abnormality. 2. Numerous chronic microhemorrhages in the cerebellum, brainstem, and cerebral hemispheres which could reflect the sequelae of chronic hypertension or cerebral amyloid angiopathy. 3. 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 Electronically Signed   By: Allen  Grady M.D.   On: 08/05/2017 13:29    Procedures Procedures (including critical care time)  Medications Ordered in ED Medications  0.9 %  sodium chloride infusion ( Intravenous New Bag/Given 08/05/17 1047)  cefTRIAXone (ROCEPHIN) 1 g in sodium chloride 0.9 % 100 mL IVPB (0 g Intravenous Stopped 08/05/17 1502)  meclizine (ANTIVERT) tablet 25 mg (25 mg Oral Given 08/05/17 1422)     Initial Impression / Assessment and Plan / ED Course  I have reviewed the triage vital signs and the nursing notes.  Pertinent labs & imaging results that were available during my care of the patient were reviewed by me and considered in my medical decision making (see chart for details).     Patient's workup for the vertigo included MRI brain without evidence of any acute stroke.  Patient has long-standing indwelling catheter.  Urinalysis suggestive of urinary tract infection vertically sensitive positive nitrite.  Culture sent and started with antibiotics here received a dose of  Rocephin and will be continued on Keflex as an outpatient.  Also given follow-up with neurology for the vertigo.  No acute findings for the vertigo.  Patient feeling much better on Antivert.  Patient stable for discharge home.  Sheet was made aware that the MRI did show evidence of some chronic infarcts.  Final Clinical Impressions(s) / ED Diagnoses   Final diagnoses:  Acute cystitis with hematuria  Vertigo    ED Discharge Orders        Ordered    cephALEXin (KEFLEX) 500 MG capsule  4 times daily     03" /10/19 1502    meclizine (ANTIVERT) 25 MG tablet  3 times daily PRN  08/05/17 1502       Fredia Sorrow, MD 08/05/17 1513

## 2017-08-05 NOTE — Discharge Instructions (Signed)
Take the antibiotic for the urinary tract infection.  Take the Antivert for the dizziness.  Also given a referral to follow-up with neurology for the dizziness.  Today's workup without any acute findings to include MRI of the brain.  However her urine is very suggestive of being a urinary tract infection could be contamination urine culture is been sent for confirmation.  Return for any new or worse symptoms.

## 2017-08-05 NOTE — ED Triage Notes (Signed)
EMS stated, he called and said John Shields been dizzy for the last 4 hours, walked down steps to EMS truck. Here earlier and left AMA.

## 2017-08-05 NOTE — ED Triage Notes (Signed)
Pt. Stated, I woke up this morning and felt the room spinning. This has happened before.

## 2017-08-06 LAB — URINE CULTURE

## 2017-08-07 DIAGNOSIS — R338 Other retention of urine: Secondary | ICD-10-CM | POA: Diagnosis not present

## 2017-08-31 ENCOUNTER — Emergency Department (HOSPITAL_COMMUNITY): Payer: Medicare Other

## 2017-08-31 ENCOUNTER — Other Ambulatory Visit: Payer: Self-pay

## 2017-08-31 ENCOUNTER — Inpatient Hospital Stay (HOSPITAL_COMMUNITY)
Admission: EM | Admit: 2017-08-31 | Discharge: 2017-09-03 | DRG: 065 | Disposition: A | Discharge status: Home Health | Payer: Medicare Other | Attending: Internal Medicine | Admitting: Internal Medicine

## 2017-08-31 DIAGNOSIS — I6789 Other cerebrovascular disease: Secondary | ICD-10-CM | POA: Diagnosis not present

## 2017-08-31 DIAGNOSIS — C641 Malignant neoplasm of right kidney, except renal pelvis: Secondary | ICD-10-CM | POA: Diagnosis present

## 2017-08-31 DIAGNOSIS — I639 Cerebral infarction, unspecified: Secondary | ICD-10-CM | POA: Diagnosis not present

## 2017-08-31 DIAGNOSIS — I10 Essential (primary) hypertension: Secondary | ICD-10-CM | POA: Diagnosis not present

## 2017-08-31 DIAGNOSIS — I447 Left bundle-branch block, unspecified: Secondary | ICD-10-CM | POA: Diagnosis not present

## 2017-08-31 DIAGNOSIS — N390 Urinary tract infection, site not specified: Secondary | ICD-10-CM | POA: Diagnosis present

## 2017-08-31 DIAGNOSIS — G43909 Migraine, unspecified, not intractable, without status migrainosus: Secondary | ICD-10-CM | POA: Diagnosis present

## 2017-08-31 DIAGNOSIS — I6339 Cerebral infarction due to thrombosis of other cerebral artery: Secondary | ICD-10-CM | POA: Diagnosis not present

## 2017-08-31 DIAGNOSIS — R4781 Slurred speech: Secondary | ICD-10-CM | POA: Diagnosis present

## 2017-08-31 DIAGNOSIS — G8194 Hemiplegia, unspecified affecting left nondominant side: Secondary | ICD-10-CM | POA: Diagnosis present

## 2017-08-31 DIAGNOSIS — I6330 Cerebral infarction due to thrombosis of unspecified cerebral artery: Secondary | ICD-10-CM

## 2017-08-31 DIAGNOSIS — R29702 NIHSS score 2: Secondary | ICD-10-CM | POA: Diagnosis present

## 2017-08-31 DIAGNOSIS — C61 Malignant neoplasm of prostate: Secondary | ICD-10-CM | POA: Diagnosis present

## 2017-08-31 DIAGNOSIS — E785 Hyperlipidemia, unspecified: Secondary | ICD-10-CM | POA: Diagnosis present

## 2017-08-31 DIAGNOSIS — R29818 Other symptoms and signs involving the nervous system: Secondary | ICD-10-CM | POA: Diagnosis not present

## 2017-08-31 DIAGNOSIS — D696 Thrombocytopenia, unspecified: Secondary | ICD-10-CM | POA: Diagnosis present

## 2017-08-31 DIAGNOSIS — N400 Enlarged prostate without lower urinary tract symptoms: Secondary | ICD-10-CM | POA: Diagnosis present

## 2017-08-31 DIAGNOSIS — R531 Weakness: Secondary | ICD-10-CM

## 2017-08-31 DIAGNOSIS — Z8249 Family history of ischemic heart disease and other diseases of the circulatory system: Secondary | ICD-10-CM

## 2017-08-31 DIAGNOSIS — Z7982 Long term (current) use of aspirin: Secondary | ICD-10-CM

## 2017-08-31 DIAGNOSIS — B9620 Unspecified Escherichia coli [E. coli] as the cause of diseases classified elsewhere: Secondary | ICD-10-CM | POA: Diagnosis present

## 2017-08-31 DIAGNOSIS — Z8673 Personal history of transient ischemic attack (TIA), and cerebral infarction without residual deficits: Secondary | ICD-10-CM

## 2017-08-31 LAB — CBC
HEMATOCRIT: 40.9 % (ref 39.0–52.0)
Hemoglobin: 13.1 g/dL (ref 13.0–17.0)
MCH: 23.5 pg — AB (ref 26.0–34.0)
MCHC: 32 g/dL (ref 30.0–36.0)
MCV: 73.4 fL — AB (ref 78.0–100.0)
PLATELETS: 183 10*3/uL (ref 150–400)
RBC: 5.57 MIL/uL (ref 4.22–5.81)
RDW: 15 % (ref 11.5–15.5)
WBC: 6.2 10*3/uL (ref 4.0–10.5)

## 2017-08-31 LAB — I-STAT CHEM 8, ED
BUN: 26 mg/dL — ABNORMAL HIGH (ref 6–20)
CALCIUM ION: 1.14 mmol/L — AB (ref 1.15–1.40)
Chloride: 101 mmol/L (ref 101–111)
Creatinine, Ser: 1.1 mg/dL (ref 0.61–1.24)
GLUCOSE: 112 mg/dL — AB (ref 65–99)
HCT: 44 % (ref 39.0–52.0)
Hemoglobin: 15 g/dL (ref 13.0–17.0)
Potassium: 4.7 mmol/L (ref 3.5–5.1)
SODIUM: 138 mmol/L (ref 135–145)
TCO2: 28 mmol/L (ref 22–32)

## 2017-08-31 LAB — COMPREHENSIVE METABOLIC PANEL
ALT: 21 U/L (ref 17–63)
AST: 26 U/L (ref 15–41)
Albumin: 4.3 g/dL (ref 3.5–5.0)
Alkaline Phosphatase: 61 U/L (ref 38–126)
Anion gap: 11 (ref 5–15)
BILIRUBIN TOTAL: 0.5 mg/dL (ref 0.3–1.2)
BUN: 22 mg/dL — AB (ref 6–20)
CHLORIDE: 100 mmol/L — AB (ref 101–111)
CO2: 27 mmol/L (ref 22–32)
CREATININE: 1.06 mg/dL (ref 0.61–1.24)
Calcium: 9.6 mg/dL (ref 8.9–10.3)
Glucose, Bld: 115 mg/dL — ABNORMAL HIGH (ref 65–99)
Potassium: 4.7 mmol/L (ref 3.5–5.1)
Sodium: 138 mmol/L (ref 135–145)
TOTAL PROTEIN: 7.7 g/dL (ref 6.5–8.1)

## 2017-08-31 LAB — APTT: aPTT: 30 seconds (ref 24–36)

## 2017-08-31 LAB — PROTIME-INR
INR: 0.89
PROTHROMBIN TIME: 11.9 s (ref 11.4–15.2)

## 2017-08-31 LAB — I-STAT TROPONIN, ED: TROPONIN I, POC: 0 ng/mL (ref 0.00–0.08)

## 2017-08-31 LAB — CBG MONITORING, ED: Glucose-Capillary: 109 mg/dL — ABNORMAL HIGH (ref 65–99)

## 2017-08-31 LAB — ETHANOL

## 2017-08-31 MED ORDER — IOPAMIDOL (ISOVUE-370) INJECTION 76%
100.0000 mL | Freq: Once | INTRAVENOUS | Status: AC | PRN
  Administered 2017-08-31: 100 mL via INTRAVENOUS

## 2017-08-31 MED ORDER — IOPAMIDOL (ISOVUE-370) INJECTION 76%
INTRAVENOUS | Status: AC
  Filled 2017-08-31: qty 100

## 2017-08-31 NOTE — Code Documentation (Signed)
72 yo male to [facility] ED via [facility] for code stroke.  Initial findings on arrival per EMS were left sided weakness and slurred speech.  Dr. Rory Percy at bedside.  Upon arrival, pt is alert, answering questions, bilateral lower extremity weakness with drift and wandering symptoms.  Some symptoms worsened when pt was upright like slurred speech and left sided weakness.  LSW difficult to determine.  LSW decided 2115.Pt stated onset of symptoms were 2115 but no one had seen the patient all day prior to symptom onset to verify LSW.  CT/CTA/CTP done.  NIH 2.  Symptoms improving.  No acute treatment.  No TPA due to questionable LSW and microhemmorages seen on CT.  MRI ordered.  Handoff report given to [employer].Pt to be admitted overnight.

## 2017-08-31 NOTE — ED Provider Notes (Signed)
[place] EMERGENCY DEPARTMENT Provider Note   CSN: [id number] Arrival date & time: 08/31/17  2257     History   Chief Complaint Chief Complaint  Patient presents with  . Code Stroke    HPI John Shields is a 72 y.o. male.  The history is provided by the patient.  He has history of hypertension, renal cancer, thrombocytopenia and was brought in by ambulance as a code stroke.  He states that about 9:15 PM, he try to get off his sofa and was unable to stand.  He felt like his left side was numb and weak.  He also feels like he is slurring his words.  Strength is improving, but not back to baseline.  He was last known normal at 8:45 PM when he sat down on the sofa.  He denies headache or chest pain.  Denies dyspnea or nausea.  Past Medical History:  Diagnosis Date  . Foley catheter in place   . History of sepsis 12/27/2016   sepsis from prostate abscess post prostate bx  . Hypertension   . LBBB (left bundle branch block)   . Prostate cancer, primary, 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 [facility]) urologist-  dr Tresa Moore   dx 07/ 2018 bx-- Gleason 8, PSA 400, vol 373ml-- abd. lymphadenopathy  . Renal cancer, right ([place])   . Thrombocytopenia ([place])   . Urine retention    intermittant since 2016    Patient Active Problem List   Diagnosis Date Noted  . Near syncope 07/25/2017  . Prostate cancer metastatic to multiple sites [facility])   . Left bundle branch block (LBBB) on electrocardiogram   . H/O bilateral orchiectomies   . Dyspnea on exertion   . BPH (benign prostatic hyperplasia) 12/27/2016  . Renal mass 12/27/2016  . Thrombocytopenia ([place]) 12/27/2016  . Sepsis ([place]) 12/27/2016  . Elevated LFTs   . Lactic acidosis   . AKI (acute kidney injury) ([place])   . Urinary retention 06/07/2016  . Constipation 06/07/2016  . Acute renal failure (ARF) ([place]) 06/07/2016  . Essential hypertension 06/07/2016  . Hyperglycemia 06/07/2016    Past Surgical History:   Procedure Laterality Date  . NO PAST SURGERIES    . ORCHIECTOMY Bilateral 01/31/2017   Procedure: ORCHIECTOMY;  Surgeon: Alexis Frock, MD;  Location: [facility];  Service: Urology;  Laterality: Bilateral;        Home Medications    Prior to Admission medications   Medication Sig Start Date End Date Taking? Authorizing Provider  amLODipine (NORVASC) 5 MG tablet Take 1 tablet (5 mg total) by mouth daily. 01/03/17 08/05/17  Murlean Iba, MD  aspirin EC 81 MG tablet Take 81 mg by mouth 2 (two) times daily.    [provider]  cephALEXin (KEFLEX) 500 MG capsule Take 1 capsule (500 mg total) by mouth 4 (four) times daily. 08/05/17   Fredia Sorrow, MD  lisinopril (PRINIVIL,ZESTRIL) 20 MG tablet Take 1 tablet (20 mg total) by mouth daily. 01/03/17 08/05/17  Murlean Iba, MD  meclizine (ANTIVERT) 25 MG tablet Take 1 tablet (25 mg total) by mouth 3 (three) times daily as needed for dizziness. 08/05/17   Fredia Sorrow, MD  metoprolol succinate (TOPROL-XL) 25 MG 24 hr tablet Take 1 tablet (25 mg total) by mouth daily. Patient taking differently: Take 12.5 mg by mouth 2 (two) times daily.  01/03/17 07/25/17  Johnson, Clanford L, MD  metoprolol tartrate (LOPRESSOR) 25 MG tablet Take 25 mg by mouth  daily.    [provider]  polyethylene glycol (MIRALAX / GLYCOLAX) packet Take 17 g by mouth daily. Patient not taking: Reported on 03/07/2017 06/10/16   Modena Jansky, MD  tamsulosin (FLOMAX) 0.4 MG CAPS capsule Take 0.4 mg by mouth.    [provider]    Family History Family History  Problem Relation Age of Onset  . Heart attack Father   . Heart attack Brother     Social History Social History   Tobacco Use  . Smoking status: Never Smoker  . Smokeless tobacco: Never Used  Substance Use Topics  . Alcohol use: No  . Drug use: No     Allergies   Patient has no known allergies.   Review of Systems Review of Systems  All other  systems reviewed and are negative.    Physical Exam Updated Vital Signs BP 119/90 (BP Location: Right Arm)   Pulse 76   Temp 98.3 F (36.8 C) (Oral)   Resp 20   Ht 6' (1.829 m)   Wt 98.8 kg (217 lb 13 oz)   SpO2 95%   BMI 29.54 kg/m   Physical Exam  Nursing note and vitals reviewed.  72 year old male, resting comfortably and in no acute distress. Vital signs are normal. Oxygen saturation is 95%, which is normal. Head is normocephalic and atraumatic. PERRLA, EOMI. Oropharynx is clear. Neck is nontender and supple without adenopathy or JVD.  There are no carotid bruits. Back is nontender and there is no CVA tenderness. Lungs are clear without rales, wheezes, or rhonchi. Chest is nontender. Heart has regular rate and rhythm without murmur. Abdomen is soft, flat, nontender without masses or hepatosplenomegaly and peristalsis is normoactive. Extremities have no cyanosis or edema, full range of motion is present. Skin is warm and dry without rash. Neurologic: He is awake and alert.  He is oriented x3.  Speech is very minimally dysarthric.  There is no facial asymmetry.  Cranial nerves are intact.  There is no pronator drift, but left arm strength is slightly diminished at 4/5.  Left leg strength is also diminished at 4/5.  Right arm and leg strength are 5/5.  He is left-handed.  There are no objective sensory deficits.  ED Treatments / Results  Labs (all labs ordered are listed, but only abnormal results are displayed) Labs Reviewed  ETHANOL  PROTIME-INR  APTT  CBC  DIFFERENTIAL  COMPREHENSIVE METABOLIC PANEL  RAPID URINE DRUG SCREEN, HOSP PERFORMED  URINALYSIS, ROUTINE W REFLEX MICROSCOPIC  I-STAT CHEM 8, ED  I-STAT TROPONIN, ED    EKG EKG Interpretation  Date/Time:  Friday August 31 2017 23:30:44 EDT Ventricular Rate:  80 PR Interval:    QRS Duration: 140 QT Interval:  398 QTC Calculation: 460 R Axis:   -65 Text Interpretation:  Sinus rhythm Left bundle branch  block When compared with ECG of 08/05/2017, No significant change was found Confirmed by Delora Fuel ([id number]) on 08/31/2017 11:37:42 PM   Radiology Ct Angio Head W Or Wo Contrast  Result Date: 08/31/2017 CLINICAL DATA:  72 y/o  M; left-sided weakness and slurred speech. EXAM: CT ANGIOGRAPHY HEAD AND NECK CT PERFUSION BRAIN TECHNIQUE: Multidetector CT imaging of the head and neck was performed using the standard protocol during bolus administration of intravenous contrast. Multiplanar CT image reconstructions and MIPs were obtained to evaluate the vascular anatomy. Carotid stenosis measurements (when applicable) are obtained utilizing NASCET criteria, using the distal internal carotid diameter as the denominator. Multiphase CT  imaging of the brain was performed following IV bolus contrast injection. Subsequent parametric perfusion maps were calculated using RAPID software. CONTRAST:  179mL ISOVUE-370 IOPAMIDOL (ISOVUE-370) INJECTION 76% COMPARISON:  08/31/2017 CT of the head. FINDINGS: CTA NECK FINDINGS Aortic arch: Left vertebral artery arises from the aortic arch. Imaged portion shows no evidence of aneurysm or dissection. No significant stenosis of the major arch vessel origins. Mild fibrofatty plaque. 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 No high-grade stenosis or dissection. Left carotid system: No evidence of dissection, stenosis (50% or greater) or occlusion. Vertebral arteries: Right dominant. No evidence of dissection, stenosis (50% or greater) or occlusion. Skeleton: Mild multilevel cervical spondylosis. No high-grade bony canal stenosis. Other neck: Negative. Upper chest: Negative. Review of the MIP images confirms the above findings CTA HEAD FINDINGS Anterior circulation: No significant stenosis, proximal occlusion, aneurysm, or vascular malformation. Right cavernous ICA fibrofatty  plaque with mild stenosis. Posterior circulation: No significant stenosis, proximal occlusion, aneurysm, or vascular malformation. Venous sinuses: As permitted by contrast timing, patent. Anatomic variants: Bilateral posterior communicating arteries. No anterior communicating artery, likely hypoplastic or absent. Delayed phase: No abnormal intracranial enhancement. Review of the MIP images confirms the above findings CT Brain Perfusion Findings: CBF (<30%) Volume: 15mL Perfusion (Tmax>6.0s) volume: 88mL Mismatch Volume: 4mL Infarction Location:Negative. IMPRESSION: 1. Patent carotid and vertebral arteries. No dissection or hemodynamically significant stenosis utilizing NASCET criteria. 2. Patent anterior and posterior intracranial circulation. No large vessel occlusion, aneurysm, or significant stenosis. 3. 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 4. Negative CT brain perfusion. These results were called by telephone at the time of interpretation on 08/31/2017 at 11:49 pm to Dr. Amie Portland , who verbally acknowledged these results. Electronically Signed   By: Kristine Garbe M.D.   On: 08/31/2017 23:49   Ct Angio Neck W Or Wo Contrast  Result Date: 08/31/2017 CLINICAL DATA:  72 y/o  M; left-sided weakness and slurred speech. EXAM: CT ANGIOGRAPHY HEAD AND NECK CT PERFUSION BRAIN TECHNIQUE: Multidetector CT imaging of the head and neck was performed using the standard protocol during bolus administration of intravenous contrast. Multiplanar CT image reconstructions and MIPs were obtained to evaluate the vascular anatomy. Carotid stenosis measurements (when applicable) are obtained utilizing NASCET criteria, using the distal internal carotid diameter as the denominator. Multiphase CT imaging of the brain was performed following IV bolus contrast injection. Subsequent parametric perfusion maps were calculated using RAPID software. CONTRAST:  177mL ISOVUE-370 IOPAMIDOL (ISOVUE-370) INJECTION 76%  COMPARISON:  08/31/2017 CT of the head. FINDINGS: CTA NECK FINDINGS Aortic arch: Left vertebral artery arises from the aortic arch. Imaged portion shows no evidence of aneurysm or dissection. No significant stenosis of the major arch vessel origins. Mild fibrofatty plaque. 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 No high-grade stenosis or dissection. Left carotid system: No evidence of dissection, stenosis (50% or greater) or occlusion. Vertebral arteries: Right dominant. No evidence of dissection, stenosis (50% or greater) or occlusion. Skeleton: Mild multilevel cervical spondylosis. No high-grade bony canal stenosis. Other neck: Negative. Upper chest: Negative. Review of the MIP images confirms the above findings CTA HEAD FINDINGS Anterior circulation: No significant stenosis, proximal occlusion, aneurysm, or vascular malformation. Right cavernous ICA fibrofatty plaque  with mild stenosis. Posterior circulation: No significant stenosis, proximal occlusion, aneurysm, or vascular malformation. Venous sinuses: As permitted by contrast timing, patent. Anatomic variants: Bilateral posterior communicating arteries. No anterior communicating artery, likely hypoplastic or absent. Delayed phase: No abnormal intracranial enhancement. Review of the MIP images confirms the above findings CT Brain Perfusion Findings: CBF (<30%) Volume: 22mL Perfusion (Tmax>6.0s) volume: 65mL Mismatch Volume: 18mL Infarction Location:Negative. IMPRESSION: 1. Patent carotid and vertebral arteries. No dissection or hemodynamically significant stenosis utilizing NASCET criteria. 2. Patent anterior and posterior intracranial circulation. No large vessel occlusion, aneurysm, or significant stenosis. 3. 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 4. Negative CT brain perfusion. These results were called by telephone at the time  of interpretation on 08/31/2017 at 11:49 pm to Dr. Amie Portland , who verbally acknowledged these results. Electronically Signed   By: Kristine Garbe M.D.   On: 08/31/2017 23:49   Ct Cerebral Perfusion W Contrast  Result Date: 08/31/2017 CLINICAL DATA:  72 y/o  M; left-sided weakness and slurred speech. EXAM: CT ANGIOGRAPHY HEAD AND NECK CT PERFUSION BRAIN TECHNIQUE: Multidetector CT imaging of the head and neck was performed using the standard protocol during bolus administration of intravenous contrast. Multiplanar CT image reconstructions and MIPs were obtained to evaluate the vascular anatomy. Carotid stenosis measurements (when applicable) are obtained utilizing NASCET criteria, using the distal internal carotid diameter as the denominator. Multiphase CT imaging of the brain was performed following IV bolus contrast injection. Subsequent parametric perfusion maps were calculated using RAPID software. CONTRAST:  129mL ISOVUE-370 IOPAMIDOL (ISOVUE-370) INJECTION 76% COMPARISON:  08/31/2017 CT of the head. FINDINGS: CTA NECK FINDINGS Aortic arch: Left vertebral artery arises from the aortic arch. Imaged portion shows no evidence of aneurysm or dissection. No significant stenosis of the major arch vessel origins. Mild fibrofatty plaque. 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 No high-grade stenosis or dissection. Left carotid system: No evidence of dissection, stenosis (50% or greater) or occlusion. Vertebral arteries: Right dominant. No evidence of dissection, stenosis (50% or greater) or occlusion. Skeleton: Mild multilevel cervical spondylosis. No high-grade bony canal stenosis. Other neck: Negative. Upper chest: Negative. Review of the MIP images confirms the above findings CTA HEAD FINDINGS Anterior circulation: No significant stenosis, proximal occlusion, aneurysm, or vascular  malformation. Right cavernous ICA fibrofatty plaque with mild stenosis. Posterior circulation: No significant stenosis, proximal occlusion, aneurysm, or vascular malformation. Venous sinuses: As permitted by contrast timing, patent. Anatomic variants: Bilateral posterior communicating arteries. No anterior communicating artery, likely hypoplastic or absent. Delayed phase: No abnormal intracranial enhancement. Review of the MIP images confirms the above findings CT Brain Perfusion Findings: CBF (<30%) Volume: 64mL Perfusion (Tmax>6.0s) volume: 11mL Mismatch Volume: 37mL Infarction Location:Negative. IMPRESSION: 1. Patent carotid and vertebral arteries. No dissection or hemodynamically significant stenosis utilizing NASCET criteria. 2. Patent anterior and posterior intracranial circulation. No large vessel occlusion, aneurysm, or significant stenosis. 3. 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 4. Negative CT brain perfusion. These results were called by telephone at the time of interpretation on 08/31/2017 at 11:49 pm to Dr. Amie Portland , who verbally acknowledged these results. Electronically Signed   By: Kristine Garbe M.D.   On: 08/31/2017 23:49   Ct Head Code Stroke Wo Contrast  Result Date: 08/31/2017 CLINICAL DATA:  Code stroke. 72 y/o M; left-sided weakness and slurred  speech. EXAM: CT HEAD WITHOUT CONTRAST TECHNIQUE: Contiguous axial images were obtained from the base of the skull through the vertex without intravenous contrast. COMPARISON:  08/05/2017 MRI head.  07/25/2017 CT head. FINDINGS: Brain: No evidence of acute infarction, hemorrhage, hydrocephalus, extra-axial collection or mass lesion/mass effect. Stable chronic microvascular ischemic changes and parenchymal volume loss of the brain given differences in technique. Vascular: No hyperdense vessel or unexpected calcification. Skull: Normal. Negative for fracture or focal lesion. Sinuses/Orbits: No acute finding. Other: None.  ASPECTS [facility] Stroke Program Early CT Score) - Ganglionic level infarction (caudate, lentiform nuclei, internal capsule, insula, M1-M3 cortex): 7 - Supraganglionic infarction (M4-M6 cortex): 3 Total score (0-10 with 10 being normal): 10 IMPRESSION: 1. No acute intracranial abnormality identified. 2. Stable chronic microvascular ischemic changes and parenchymal volume loss of the brain given differences in technique. 3. ASPECTS is 10 These results were communicated to Dr. Rory Percy at 11:20 pmon 4/5/2019by text page via the [facility] messaging system. Electronically Signed   By: Kristine Garbe M.D.   On: 08/31/2017 23:20    Procedures Procedures  CRITICAL CARE Performed by: Delora Fuel Total critical care time: 50 minutes Critical care time was exclusive of separately billable procedures and treating other patients. Critical care was necessary to treat or prevent imminent or life-threatening deterioration. Critical care was time spent personally by me on the following activities: development of treatment plan with patient and/or surrogate as well as nursing, discussions with consultants, evaluation of patient's response to treatment, examination of patient, obtaining history from patient or surrogate, ordering and performing treatments and interventions, ordering and review of laboratory studies, ordering and review of radiographic studies, pulse oximetry and re-evaluation of patient's condition.  Medications Ordered in ED Medications  iopamidol (ISOVUE-370) 76 % injection 100 mL (100 mLs Intravenous Contrast Given 08/31/17 2312)     Initial Impression / Assessment and Plan / ED Course  I have reviewed the triage vital signs and the nursing notes.  Pertinent labs & imaging results that were available during my care of the patient were reviewed by me and considered in my medical decision making (see chart for details).  Stroke with relatively mild deficits.  Patient was seen in conjunction  with Dr. Rory Percy, neuro hospitalist.  CT of head was unremarkable-no hemorrhage and no evidence of acute stroke.  CT angiogram of neck and head and CT cerebral perfusion studies have been done, results pending.  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  Microvascular ischemic changes noted.  He is not a candidate for TPA or interventional radiology.  MRI has been ordered.  Case is discussed with Dr. Myna Hidalgo of [facility] hospital[facility], who agrees to admit the patient.  Final Clinical Impressions(s) / ED Diagnoses   Final diagnoses:  Cerebrovascular accident (CVA), unspecified mechanism [facility])    ED Discharge Orders    None       Delora Fuel, MD 94/80/16 [phone]

## 2017-08-31 NOTE — Consult Note (Signed)
Neurology Consultation  Reason for Consult: Acute code stroke Referring Physician: Dr. Roxanne Mins  CC: Left-sided numbness  History is obtained from: Patient, EMS  HPI: John Shields is a 72 y.o. male past medical history of hypertension, renal cell cancer, prostate cancer, urinary retention, who has self-reported history of strokes (was never symptomatic from strokes but was told that he has had strokes in the past based on brain imaging prior) who was in his usual state of health till 9:15 PM on 08/31/2017 when he started noticing sudden onset of left arm weakness.  EMS was called.  He also reported left-sided numbness and slurred speech. EMS did not appreciate any facial droop but did appreciate left-sided weakness. His gait was normal as he was getting onto the stretcher per EMS but they said that when he stood up from the lying position, his weakness and numbness subjectively got worse. On arrival here at [facility], he was seen and examined at the bridge.  He demonstrated bilateral leg drift but no other abnormalities on exam. He was taken in for a stat CT of the head and a CT angiogram-see below. He reports that he has had multiple strokes in the past which he came to know off after a brain MRI was done last time he presented to [facility] for ataxia.  He has never had symptoms related to strokes.  Of note, chart review reveals that he has been evaluated multiple times for ataxia neurological symptoms.  MRI has been negative for any acute change but has shown extensive migraine which is both supra and infratentorially. Last time when he presented with ataxia and got the MRI, I did not reveal a stroke but his workup revealed a UTI.  LKW: 9:15 PM on 08/31/2017 tpa given?: no, low NIH, extensive micro bleeds on prior MRI Premorbid modified Rankin scale (mRS): 1  ROS: ROS was performed and is negative except as noted in the HPI.  Past Medical History:  Diagnosis Date  . Foley catheter  in place   . History of sepsis 12/27/2016   sepsis from prostate abscess post prostate bx  . Hypertension   . LBBB (left bundle branch block)   . 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 lymphadenopathy  . Renal cancer, right ([place])   . Thrombocytopenia ([place])   . Urine retention    intermittant since 2016    Family History  Problem Relation Age of Onset  . Heart attack Father   . Heart attack Brother     Social History:   reports that he has never smoked. He has never used smokeless tobacco. He reports that he does not drink alcohol or use drugs.  Medications  Current Facility-Administered Medications:  .  iopamidol (ISOVUE-370) 76 % injection, , , ,   Current Outpatient Medications:  .  amLODipine (NORVASC) 5 MG tablet, Take 1 tablet (5 mg total) by mouth daily., Disp: 30 tablet, Rfl: 0 .  aspirin EC 81 MG tablet, Take 81 mg by mouth 2 (two) times daily., Disp: , Rfl:  .  cephALEXin (KEFLEX) 500 MG capsule, Take 1 capsule (500 mg total) by mouth 4 (four) times daily., Disp: 28 capsule, Rfl: 0 .  lisinopril (PRINIVIL,ZESTRIL) 20 MG tablet, Take 1 tablet (20 mg total) by mouth daily., Disp: 30 tablet, Rfl: 0 .  meclizine (ANTIVERT) 25 MG tablet, Take 1 tablet (  25 mg total) by mouth 3 (three) times daily as needed for dizziness., Disp: 30 tablet, Rfl: 0 .  metoprolol succinate (TOPROL-XL) 25 MG 24 hr tablet, Take 1 tablet (25 mg total) by mouth daily. (Patient taking differently: Take 12.5 mg by mouth 2 (two) times daily. ), Disp: 30 tablet, Rfl: 0 .  metoprolol tartrate (LOPRESSOR) 25 MG tablet, Take 25 mg by mouth daily., Disp: , Rfl:  .  polyethylene glycol (MIRALAX / GLYCOLAX) packet, Take 17 g by mouth daily. (Patient not taking: Reported on 03/07/2017), Disp: 14 each, Rfl: 0 .  tamsulosin (FLOMAX) 0.4 MG CAPS capsule, Take 0.4 mg by mouth., Disp: , Rfl:    Exam: Current vital signs: Ht 6' (1.829 m)   Wt 101.1 kg (222 lb 14.2 oz) Comment: wearing clothes/shoes  BMI 30.23 kg/m  Vital signs in last 24 hours: Weight:  [101.1 kg (222 lb 14.2 oz)] 101.1 kg (222 lb 14.2 oz) (04/05 2300) General: Well-developed well-nourished no acute distress HEENT: Normocephalic atraumatic moist oral looks members CVS: S1-S2 regular rate and Chest clear to auscultate bilaterally Abdomen nondistended nontender Extremities warm well perfused Neurological exam Patient is awake alert oriented x3. Speech is clear. Naming, compression repetition intact. Cranial nerves: Pupils equal round reactive to light, extra movements intact, face symmetric, facial sensation intact bilaterally, palate midline, tongue midline, shoulder shrug intact. Motor exam: 5/5 bilateral upper extremities with no drift. Bilateral lower extremities show vertical drift.  4/5 right lower and 4/5 left lower extremity. Sensory exam: Intact to light touch all over with no extinction Coordination: Intact finger nose finger. Unable to perform heel-knee-shin Gait testing was deferred NIH stroke scale - 2  Labs I have reviewed labs in epic and the results pertinent to this consultation are:  CBC    Component Value Date/Time   WBC 6.3 08/05/2017 0750   RBC 5.31 08/05/2017 0750   HGB 15.0 08/31/2017 2306   HCT 44.0 08/31/2017 2306   PLT 189 08/05/2017 0750   MCV 74.2 (L) 08/05/2017 0750   MCH 23.4 (L) 08/05/2017 0750   MCHC 31.5 08/05/2017 0750   RDW 15.5 08/05/2017 0750   LYMPHSABS 1.8 07/25/2017 1057   MONOABS 0.4 07/25/2017 1057   EOSABS 0.1 07/25/2017 1057   BASOSABS 0.0 07/25/2017 1057    CMP     Component Value Date/Time   NA 138 08/31/2017 2306   K 4.7 08/31/2017 2306   CL 101 08/31/2017 2306   CO2 23 08/05/2017 0750   GLUCOSE 112 (H) 08/31/2017 2306   BUN 26 (H) 08/31/2017 2306   CREATININE 1.10 08/31/2017 2306   CALCIUM 9.3 08/05/2017 0750   PROT 7.7 07/25/2017  1057   ALBUMIN 4.3 07/25/2017 1057   AST 23 07/25/2017 1057   ALT 19 07/25/2017 1057   ALKPHOS 54 07/25/2017 1057   BILITOT 0.6 07/25/2017 1057   GFRNONAA >60 08/05/2017 0750   GFRAA >60 08/05/2017 0750    Imaging I have reviewed the images obtained:  CT-scan of the brain - no acute changes. Chronic WM disease MRI from 07/2017 -numerous microhemorrhages mostly in the cerebellum and also bilateral medial frontal lobes.  Atrophy disproportionate age.  Chronic small vessel disease.  CTA head and neck and perfusion-no LVO.  No perfusion abnormality.  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 No surrounding edema.  Assessment:  72 year old man past history of hypertension, renal cell cancer prostate cancer, evidence of extensive [place]o hemorrhages on his prior MRI, presents for 9:15 PM onset of  left arm weakness.  Says he felt left-sided numbness and slurred speech as well. On my examination, he had bilateral lower extremity weakness but no upper extremity weakness. He has been seen in the past for ataxia for which she received a head CT and a MRI of the brain.  MRI of the brain showed extensive migraine merges both supra and infratentorially. At that time he was also diagnosed with a UTI that probably was causing symptoms. My suspicion for an acute ischemic stroke at this time is low given bilateral lower extremity involvement and no laterality. I would pursue an MRI nonetheless given his risk factors. I would recommend pursuing a stroke/TIA workup since he has had multiple neurological symptoms without a clear answer over the past few months to years.  Impression: -Evaluate for stroke/TIA -Evaluate for recrudescence of stroke symptoms -Prior MRI suggestive of chronic microhemorrhages-?  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   12 mm.  No evidence of expansion of bleed per radiology.  Recommendations: Admit for stroke/TIA workup Maintain on telemetry Frequent neuro checks MRI of the brain without contrast 2D echocardiogram Hemoglobin A1c Lipid panel Continue with home aspirin 81mg  PO daily Atorvastatin 80 mg p.o. daily PT, OT, speech therapy Consider endovascular consult in AM for the right ICA saccular aneurysm  Please page stroke NP/PA/MD (listed on AMION)  from 8am-4 pm as this patient will be followed by the stroke team at this point.  -- Amie Portland, MD [facility] Pager: [phone] If 7pm to 7am, please call on call as listed on AMION.  CRITICAL CARE ATTESTATION This patient is critically ill and at significant risk of neurological worsening, death and care requires constant monitoring of vital signs, hemodynamics,respiratory and cardiac monitoring. I spent 45  minutes of neurocritical care time performing neurological assessment, discussion with family, other specialists and medical decision making of high complexityin the care of  this patient.

## 2017-08-31 NOTE — ED Triage Notes (Signed)
Pt brought in by [facility] from home for left sided weakness and slurred speech. Pt states he was trying to get off of the couch when he noticed he was unable to walk and attempted to crawl on the floor. Pt has hx of multiple strokes. Pt unable to lift either leg on assessment prior to CT scan. Pt A+Ox4 at this time. Pt in no apparent distress. Pt not on any anticoagulants. Per EMS pt was able to ambulate to stretcher.

## 2017-09-01 ENCOUNTER — Emergency Department (HOSPITAL_COMMUNITY): Payer: Medicare Other

## 2017-09-01 DIAGNOSIS — N390 Urinary tract infection, site not specified: Secondary | ICD-10-CM | POA: Diagnosis not present

## 2017-09-01 DIAGNOSIS — R531 Weakness: Secondary | ICD-10-CM | POA: Diagnosis not present

## 2017-09-01 DIAGNOSIS — I639 Cerebral infarction, unspecified: Secondary | ICD-10-CM | POA: Diagnosis not present

## 2017-09-01 DIAGNOSIS — R29818 Other symptoms and signs involving the nervous system: Secondary | ICD-10-CM | POA: Diagnosis not present

## 2017-09-01 DIAGNOSIS — C61 Malignant neoplasm of prostate: Secondary | ICD-10-CM

## 2017-09-01 DIAGNOSIS — I6330 Cerebral infarction due to thrombosis of unspecified cerebral artery: Secondary | ICD-10-CM

## 2017-09-01 DIAGNOSIS — R4781 Slurred speech: Secondary | ICD-10-CM | POA: Diagnosis not present

## 2017-09-01 DIAGNOSIS — I10 Essential (primary) hypertension: Secondary | ICD-10-CM | POA: Diagnosis not present

## 2017-09-01 LAB — URINALYSIS, ROUTINE W REFLEX MICROSCOPIC
BILIRUBIN URINE: NEGATIVE
GLUCOSE, UA: NEGATIVE mg/dL
Ketones, ur: NEGATIVE mg/dL
NITRITE: POSITIVE — AB
PROTEIN: 30 mg/dL — AB
Specific Gravity, Urine: 1.019 (ref 1.005–1.030)
Squamous Epithelial / LPF: NONE SEEN
pH: 6 (ref 5.0–8.0)

## 2017-09-01 LAB — DIFFERENTIAL
Basophils Absolute: 0 10*3/uL (ref 0.0–0.1)
Basophils Relative: 0 %
Eosinophils Absolute: 0.2 10*3/uL (ref 0.0–0.7)
Eosinophils Relative: 3 %
LYMPHS ABS: 3 10*3/uL (ref 0.7–4.0)
LYMPHS PCT: 49 %
MONO ABS: 0.6 10*3/uL (ref 0.1–1.0)
Monocytes Relative: 10 %
NEUTROS ABS: 2.4 10*3/uL (ref 1.7–7.7)
NEUTROS PCT: 38 %
Smear Review: ADEQUATE

## 2017-09-01 LAB — RAPID URINE DRUG SCREEN, HOSP PERFORMED
Amphetamines: NOT DETECTED
Barbiturates: NOT DETECTED
Benzodiazepines: NOT DETECTED
Cocaine: NOT DETECTED
OPIATES: NOT DETECTED
Tetrahydrocannabinol: NOT DETECTED

## 2017-09-01 LAB — LIPID PANEL
Cholesterol: 211 mg/dL — ABNORMAL HIGH (ref 0–200)
HDL: 63 mg/dL (ref >40)
LDL Cholesterol: 139 mg/dL — ABNORMAL HIGH (ref 0–99)
TRIGLYCERIDES: 45 mg/dL (ref <150)
Total CHOL/HDL Ratio: 3.3 RATIO
VLDL: 9 mg/dL (ref 0–40)

## 2017-09-01 LAB — HEMOGLOBIN A1C
Hgb A1c MFr Bld: 5.9 % — ABNORMAL HIGH (ref 4.8–5.6)
MEAN PLASMA GLUCOSE: 122.63 mg/dL

## 2017-09-01 MED ORDER — ATORVASTATIN CALCIUM 80 MG PO TABS
80.0000 mg | ORAL_TABLET | Freq: Every day | ORAL | Status: DC
  Administered 2017-09-01 – 2017-09-03 (×3): 80 mg via ORAL
  Filled 2017-09-01 (×3): qty 1

## 2017-09-01 MED ORDER — ENOXAPARIN SODIUM 40 MG/0.4ML ~~LOC~~ SOLN
40.0000 mg | SUBCUTANEOUS | Status: DC
  Administered 2017-09-01 – 2017-09-03 (×3): 40 mg via SUBCUTANEOUS
  Filled 2017-09-01 (×3): qty 0.4

## 2017-09-01 MED ORDER — ACETAMINOPHEN 325 MG PO TABS
650.0000 mg | ORAL_TABLET | ORAL | Status: DC | PRN
Start: 2017-09-01 — End: 2017-09-03

## 2017-09-01 MED ORDER — STROKE: EARLY STAGES OF RECOVERY BOOK
Freq: Once | Status: AC
  Administered 2017-09-01: 03:00:00
  Filled 2017-09-01: qty 1

## 2017-09-01 MED ORDER — ACETAMINOPHEN 650 MG RE SUPP: 650.0000 mg | RECTAL | Status: DC | PRN

## 2017-09-01 MED ORDER — SODIUM CHLORIDE 0.9 % IV SOLN
1.0000 g | Freq: Two times a day (BID) | INTRAVENOUS | Status: DC
  Administered 2017-09-01 – 2017-09-03 (×5): 1 g via INTRAVENOUS
  Filled 2017-09-01 (×6): qty 1

## 2017-09-01 MED ORDER — CLOPIDOGREL BISULFATE 75 MG PO TABS
75.0000 mg | ORAL_TABLET | Freq: Every day | ORAL | Status: DC
  Administered 2017-09-01 – 2017-09-03 (×3): 75 mg via ORAL
  Filled 2017-09-01 (×3): qty 1

## 2017-09-01 MED ORDER — LORAZEPAM 2 MG/ML IJ SOLN
1.0000 mg | Freq: Once | INTRAMUSCULAR | Status: DC
  Filled 2017-09-01: qty 1

## 2017-09-01 MED ORDER — TAMSULOSIN HCL 0.4 MG PO CAPS
0.4000 mg | ORAL_CAPSULE | Freq: Every day | ORAL | Status: DC
  Administered 2017-09-01 – 2017-09-03 (×3): 0.4 mg via ORAL
  Filled 2017-09-01 (×3): qty 1

## 2017-09-01 MED ORDER — ACETAMINOPHEN 160 MG/5ML PO SOLN: 650.0000 mg | ORAL | Status: DC | PRN

## 2017-09-01 MED ORDER — SENNOSIDES-DOCUSATE SODIUM 8.6-50 MG PO TABS: 1.0000 | ORAL_TABLET | Freq: Every evening | ORAL | Status: DC | PRN

## 2017-09-01 MED ORDER — ASPIRIN EC 81 MG PO TBEC
81.0000 mg | DELAYED_RELEASE_TABLET | Freq: Every day | ORAL | Status: DC
  Administered 2017-09-01 – 2017-09-03 (×3): 81 mg via ORAL
  Filled 2017-09-01 (×4): qty 1

## 2017-09-01 MED ORDER — SODIUM CHLORIDE 0.9 % IV SOLN
1.0000 g | Freq: Once | INTRAVENOUS | Status: AC
  Administered 2017-09-01: 1 g via INTRAVENOUS
  Filled 2017-09-01: qty 1

## 2017-09-01 MED ORDER — SODIUM CHLORIDE 0.9 % IV SOLN
INTRAVENOUS | Status: AC
  Administered 2017-09-01: 03:00:00 via INTRAVENOUS

## 2017-09-01 NOTE — Progress Notes (Signed)
PT Cancellation Note  Patient Details Name: John Shields MRN: [id number] DOB: [birth year]   Cancelled Treatment:    Reason Eval/Treat Not Completed: Patient declined, no reason specified.  Had OT evaluation and notes he walked then, plus reports he did not sleep well.  Will try later as time and pt allow.   Ramond Dial 09/01/2017, 11:18 AM   Mee Hives, PT MS Acute Rehab Dept. Number: [place]

## 2017-09-01 NOTE — Progress Notes (Signed)
Patient found star of david necklace in the bedsheets, patient placed necklace back around his neck.

## 2017-09-01 NOTE — Progress Notes (Signed)
Pharmacy Antibiotic Note  John Shields is a 72 y.o. male admitted on 08/31/2017 with neuro sx and UTI.  Pharmacy has been consulted for cefepime dosing.  Of note in August 2018 urine Cx revealed E.faecalis.  Plan: Cefepime 1g IV Q12H.  Height: 6' (182.9 cm) Weight: 222 lb 14.2 oz (101.1 kg)(wearing clothes/shoes) IBW/kg (Calculated) : 77.6  Temp (24hrs), Avg:98.3 F (36.8 C), Min:98.3 F (36.8 C), Max:98.3 F (36.8 C)  Recent Labs  Lab 08/31/17 2300 08/31/17 2306  WBC 6.2  --   CREATININE 1.06 1.10    Estimated Creatinine Clearance: 75.8 mL/min (by C-G formula based on SCr of 1.1 mg/dL).    No Known Allergies   Thank you for allowing pharmacy to be a part of this patient's care.  Wynona Neat, PharmD, BCPS  09/01/2017 12:57 AM

## 2017-09-01 NOTE — Progress Notes (Signed)
Patient ID: John Shields, male   DOB: May 19, 1946, 72 y.o.   MRN: [id number]                                                                PROGRESS NOTE                                                                                                                                                                                                             Patient Demographics:    John Shields, is a 72 y.o. male, DOB - Jan 12, 1946, SAY:[id number]  Admit date - 08/31/2017   Admitting Physician Vianne Bulls, MD  Outpatient Primary MD for the patient is [facility], [facility]  LOS - 0  Outpatient Specialists:    Chief Complaint  Patient presents with  . Code Stroke       Brief Narrative  72 y.o. male with medical history significant for metastatic prostate cancer status post bilateral orchiectomy, right renal cell carcinoma, and hypertension, now presenting to the emergency department for evaluation of weakness and slurred speech.  Patient reports that he was in his usual state of health when he sat down on his couch at approximately 8:45 PM, but when he tried to stand at approximately 9:15 PM, he was having difficulty that he attributed to the left-sided weakness and numbness.  He also reports difficulty with speech, noting that he was slurring his words.  There was no headache, change in vision or hearing, chest pain, or palpitations.  Reports that symptoms are improving, but not resolved.  He denies history of stroke per se, but reports that there was evidence of such on a prior MRI.  ED Course: Upon arrival to the ED, patient is found to be afebrile, saturating well on room air, and with vitals otherwise normal.  EKG features a sinus rhythm and chronic left bundle branch block.  Noncontrast head CT is negative for acute intracranial abnormality.  Chemistry panel is unremarkable, CBC is notable for microcytosis without anemia, INR is normal, and troponin is undetectable.  Urinalysis is  suggestive of infection.  Neurology was consulted by the ED physician and recommends a medical admission for ongoing evaluation and management of possible CVA/TIA.      Subjective:    John Shields today still feels like has slight  slurred speech. And left sided weakness.    No headache, No chest pain, No abdominal pain - No Nausea,  No Cough - SOB.    Assessment  & Plan :    Principal Problem:   Acute left-sided weakness Active Problems:   Essential hypertension   Prostate cancer, primary, 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 [facility])   Acute lower UTI    1. 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 UTI  - UA is suggestive of infection, possibly colonization of Foley  - Reports Foley changed monthly by [facility] RN - Possible that the acute neurologic sxs reflect recrudescence in old stroke sxs d/t acute UTI, so favor empiric treatment  - Culture urine (pending) -Cont cefepime  3. 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 Hypertension  - Hold lisinopril and Toprol initially while evaluating for possible acute ischemic CVA         Code Status : FULL CODE  Family Communication  : w patient  Disposition Plan  : home  Barriers For Discharge :   Consults  :   neurology  Procedures  : MRI brain  4/6 DVT Prophylaxis  :  Lovenox -  SCDs   Lab Results  Component Value Date   PLT 183 08/31/2017    Antibiotics  : Cefepime 4/6 =>   Anti-infectives (From admission, onward)   Start     Dose/Rate Route Frequency Ordered Stop   09/01/17 1200  ceFEPIme (MAXIPIME) 1 g in sodium chloride 0.9 % 100 mL IVPB     1 g 200 mL/hr over 30 Minutes Intravenous Every 12 hours 09/01/17 0100     09/01/17 0100  ceFEPIme (MAXIPIME) 1 g in sodium chloride 0.9 % 100 mL IVPB     1 g 200 mL/hr over 30 Minutes Intravenous  Once 09/01/17 0100 09/01/17 0325        Objective:   Vitals:   09/01/17 0208 09/01/17 0215 09/01/17 0400 09/01/17 0600  BP:   (!) 145/79 119/90  Pulse:   81 76  Resp:  20  20  Temp:   98.3 F (36.8 C) 98.3 F (36.8 C)  TempSrc:   Oral Oral  SpO2:   95% 95%  Weight: 98.8 kg (217 lb 13 oz)     Height: 6' (1.829 m)       Wt Readings from Last 3 Encounters:  09/01/17 98.8 kg (217 lb 13 oz)  08/05/17 93 kg (205 lb)  07/25/17 93 kg (205 lb 0.1 oz)     Intake/Output Summary (Last 24 hours) at 09/01/2017 0753 Last data filed at 09/01/2017 0341 Gross per 24 hour  Intake 220 ml  Output 500 ml  Net -280 ml     Physical Exam  Awake Alert, Oriented X 3, , Normal affect [place].AT,PERRAL Supple Neck,No  JVD, No cervical lymphadenopathy appriciated.  Symmetrical Chest wall movement, Good air movement bilaterally, CTAB RRR,No Gallops,Rubs or new Murmurs, No Parasternal Heave +ve B.Sounds, Abd Soft, No tenderness, No organomegaly appriciated, No rebound - guarding or rigidity. No Cyanosis, Clubbing or edema, No new Rash or bruise     +slurred speech Subtle left sided weakness,  Slight pronator drift, upgoing toe on the left   Data Review:    CBC Recent Labs  Lab 08/31/17 2300 08/31/17 2306  WBC 6.2  --   HGB 13.1 15.0  HCT 40.9 44.0  PLT 183  --   MCV 73.4*  --   MCH 23.5*  --   MCHC 32.0  --   RDW 15.0  --   LYMPHSABS 3.0  --    MONOABS 0.6  --   EOSABS 0.2  --   BASOSABS 0.0  --     Chemistries  Recent Labs  Lab 08/31/17 2300 08/31/17 2306  NA 138 138  K 4.7 4.7  CL 100* 101  CO2 27  --   GLUCOSE 115* 112*  BUN 22* 26*  CREATININE 1.06 1.10  CALCIUM 9.6  --   AST 26  --   ALT 21  --   ALKPHOS 61  --   BILITOT 0.5  --    ------------------------------------------------------------------------------------------------------------------ Recent Labs    09/01/17 0359  CHOL 211*  HDL 63  LDLCALC 139*  TRIG 45  CHOLHDL 3.3    Lab Results  Component Value Date   HGBA1C 5.9 (H) 09/01/2017   ------------------------------------------------------------------------------------------------------------------ No results for input(s): TSH, T4TOTAL, T3FREE, THYROIDAB in the last 72 hours.  Invalid input(s): FREET3 ------------------------------------------------------------------------------------------------------------------ No results for input(s): VITAMINB12, FOLATE, FERRITIN, TIBC, IRON, RETICCTPCT in the last 72 hours.  Coagulation profile Recent Labs  Lab 08/31/17 2300  INR 0.89    No results for input(s): DDIMER in the last 72 hours.  Cardiac Enzymes No results for input(s): CKMB, TROPONINI, MYOGLOBIN in the last 168 hours.  Invalid input(s): CK ------------------------------------------------------------------------------------------------------------------    Component Value Date/Time   BNP 9.4 07/25/2017 1745    Inpatient Medications  Scheduled Meds: . aspirin EC  81 mg Oral Daily  . atorvastatin  80 mg Oral q1800  . enoxaparin (LOVENOX) injection  40 mg Subcutaneous Q24H  . LORazepam  1 mg Intravenous Once  . tamsulosin  0.4 mg Oral Daily   Continuous Infusions: . sodium chloride 100 mL/hr at 09/01/17 0239  . ceFEPime (MAXIPIME) IV     PRN Meds:.acetaminophen **OR** acetaminophen (TYLENOL) oral liquid 160 mg/5 mL **OR** acetaminophen, senna-docusate  Micro  Results No results found for this or any previous visit (from the past 240 hour(s)).  Radiology Reports Ct Angio Head W Or Wo Contrast  Result Date: 08/31/2017 CLINICAL DATA:  72 y/o  M; left-sided weakness and slurred speech. EXAM: CT ANGIOGRAPHY HEAD AND NECK CT PERFUSION BRAIN TECHNIQUE: Multidetector CT imaging of the head and neck was performed using the standard protocol during bolus administration of intravenous contrast. Multiplanar CT image reconstructions and MIPs were obtained to evaluate the vascular anatomy. Carotid stenosis measurements (when applicable) are obtained utilizing NASCET criteria, using the distal internal carotid diameter as the denominator. Multiphase CT imaging of the brain was performed following IV bolus contrast injection. Subsequent parametric perfusion maps were calculated using RAPID software. CONTRAST:  162mL ISOVUE-370 IOPAMIDOL (ISOVUE-370) INJECTION 76% COMPARISON:  08/31/2017 CT of the head. FINDINGS: CTA NECK FINDINGS Aortic arch: Left vertebral artery arises from the aortic arch. Imaged  portion shows no evidence of aneurysm or dissection. No significant stenosis of the major arch vessel origins. Mild fibrofatty plaque. 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 No high-grade stenosis or dissection. Left carotid system: No evidence of dissection, stenosis (50% or greater) or occlusion. Vertebral arteries: Right dominant. No evidence of dissection, stenosis (50% or greater) or occlusion. Skeleton: Mild multilevel cervical spondylosis. No high-grade bony canal stenosis. Other neck: Negative. Upper chest: Negative. Review of the MIP images confirms the above findings CTA HEAD FINDINGS Anterior circulation: No significant stenosis, proximal occlusion, aneurysm, or vascular malformation. Right cavernous ICA fibrofatty plaque with mild stenosis. Posterior  circulation: No significant stenosis, proximal occlusion, aneurysm, or vascular malformation. Venous sinuses: As permitted by contrast timing, patent. Anatomic variants: Bilateral posterior communicating arteries. No anterior communicating artery, likely hypoplastic or absent. Delayed phase: No abnormal intracranial enhancement. Review of the MIP images confirms the above findings CT Brain Perfusion Findings: CBF (<30%) Volume: 78mL Perfusion (Tmax>6.0s) volume: 73mL Mismatch Volume: 7mL Infarction Location:Negative. IMPRESSION: 1. Patent carotid and vertebral arteries. No dissection or hemodynamically significant stenosis utilizing NASCET criteria. 2. Patent anterior and posterior intracranial circulation. No large vessel occlusion, aneurysm, or significant stenosis. 3. 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 4. Negative CT brain perfusion. These results were called by telephone at the time of interpretation on 08/31/2017 at 11:49 pm to Dr. Amie Portland , who verbally acknowledged these results. Electronically Signed   By: Kristine Garbe M.D.   On: 08/31/2017 23:49   Ct Angio Neck W Or Wo Contrast  Result Date: 08/31/2017 CLINICAL DATA:  72 y/o  M; left-sided weakness and slurred speech. EXAM: CT ANGIOGRAPHY HEAD AND NECK CT PERFUSION BRAIN TECHNIQUE: Multidetector CT imaging of the head and neck was performed using the standard protocol during bolus administration of intravenous contrast. Multiplanar CT image reconstructions and MIPs were obtained to evaluate the vascular anatomy. Carotid stenosis measurements (when applicable) are obtained utilizing NASCET criteria, using the distal internal carotid diameter as the denominator. Multiphase CT imaging of the brain was performed following IV bolus contrast injection. Subsequent parametric perfusion maps were calculated using RAPID software. CONTRAST:  137mL ISOVUE-370 IOPAMIDOL (ISOVUE-370) INJECTION 76% COMPARISON:  08/31/2017 CT of the  head. FINDINGS: CTA NECK FINDINGS Aortic arch: Left vertebral artery arises from the aortic arch. Imaged portion shows no evidence of aneurysm or dissection. No significant stenosis of the major arch vessel origins. Mild fibrofatty plaque. 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 No high-grade stenosis or dissection. Left carotid system: No evidence of dissection, stenosis (50% or greater) or occlusion. Vertebral arteries: Right dominant. No evidence of dissection, stenosis (50% or greater) or occlusion. Skeleton: Mild multilevel cervical spondylosis. No high-grade bony canal stenosis. Other neck: Negative. Upper chest: Negative. Review of the MIP images confirms the above findings CTA HEAD FINDINGS Anterior circulation: No significant stenosis, proximal occlusion, aneurysm, or vascular malformation. Right cavernous ICA fibrofatty plaque with mild stenosis. Posterior circulation: No significant stenosis, proximal occlusion, aneurysm, or vascular malformation. Venous sinuses: As permitted by contrast timing, patent. Anatomic variants: Bilateral posterior communicating arteries. No anterior communicating artery, likely hypoplastic or absent. Delayed phase: No abnormal intracranial enhancement. Review of the MIP images confirms the above  findings CT Brain Perfusion Findings: CBF (<30%) Volume: 67mL Perfusion (Tmax>6.0s) volume: 15mL Mismatch Volume: 73mL Infarction Location:Negative. IMPRESSION: 1. Patent carotid and vertebral arteries. No dissection or hemodynamically significant stenosis utilizing NASCET criteria. 2. Patent anterior and posterior intracranial circulation. No large vessel occlusion, aneurysm, or significant stenosis. 3. 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 4. Negative CT brain perfusion. These results were called by telephone at the time of interpretation on 08/31/2017 at  11:49 pm to Dr. Amie Portland , who verbally acknowledged these results. Electronically Signed   By: Kristine Garbe M.D.   On: 08/31/2017 23:49   Mr Brain Wo Contrast  Result Date: 09/01/2017 CLINICAL DATA:  72 y/o  M; left-sided weakness and slurred speech. EXAM: MRI HEAD WITHOUT CONTRAST TECHNIQUE: Multiplanar, multiecho pulse sequences of the brain and surrounding structures were obtained without intravenous contrast. COMPARISON:  CT head, CT perfusion head, and CTA head dated 08/31/2017. 08/05/2017 MRI head. FINDINGS: Brain: 13 mm focus of reduced diffusion in right putamen extending into posterior corona radiata and subcentimeter focus of reduced diffusion in the right caudate body compatible with acute/early subacute infarction (series 3, image 29-33). No associated hemorrhage or mass effect. Stable scatterednonspecific foci of T2 FLAIR hyperintense signal abnormality in subcortical and periventricular white matter are compatible withmild to moderatechronic microvascular ischemic changes for age. Moderatebrain parenchymal volume loss. Stable small chronic lacunar infarcts in the right pons, bilateral basal ganglia, left splenium of corpus callosum, and left external capsule/corona radiata. Numerous foci of susceptibility hypointensity compatible with hemosiderin deposition of chronic microhemorrhage present throughout the cerebellum, brainstem, left medial temporal lobe, and paramedian frontal parietal lobes. The distribution is bilateral ACA and PCA. Vascular: Normal flow voids. Skull and upper cervical spine: Normal marrow signal. Sinuses/Orbits: Mild diffuse paranasal sinus mucosal thickening. No significant abnormal signal of mastoid air cells. Orbits are unremarkable. Other: None. IMPRESSION: 1. Small foci of acute/early subacute infarction within the right putamen extending into corona radiata and right caudate body. No associated acute hemorrhage or mass effect. 2. Stable background of  chronic microvascular ischemic changes, parenchymal volume loss, and chronic lacunar infarcts. 3. Stable numerous foci of chronic microhemorrhage throughout ACA and PCA distributions. Findings may be related to chronic hypertension or amyloid angiopathy. These results were called by telephone at the time of interpretation on 09/01/2017 at 1:34 am to Dr. Delora Fuel , who verbally acknowledged these results. Electronically Signed   By: Kristine Garbe M.D.   On: 09/01/2017 01:37   Mr Brain Wo Contrast (neuro Protocol)  Result Date: 08/05/2017 CLINICAL DATA:  Dizziness. Ataxia. History of prostate and renal cancer. EXAM: MRI HEAD WITHOUT CONTRAST TECHNIQUE: Multiplanar, multiecho pulse sequences of the brain and surrounding structures were obtained without intravenous contrast. COMPARISON:  Head CT 07/25/2017 FINDINGS: Brain: There is no evidence of acute infarct, mass, midline shift, or extra-axial fluid collection. There is mild cerebral atrophy. There are innumerable chronic microhemorrhages in the cerebellum and brainstem as well as parasagittal frontal and parietal lobes. A few additional chronic microhemorrhages are noted in the temporal and occipital lobes and left external capsule/posterior lentiform nucleus region. T2 hyperintensities in the periventricular greater than subcortical cerebral white matter and pons are nonspecific but compatible with mild-to-moderate chronic small vessel ischemic disease. Chronic lacunar infarcts are present in the right pons, bilateral basal ganglia, left external capsule/corona radiata, and left splenium of the corpus callosum/periatrial white matter. There is moderate cerebral atrophy. Vascular: Major intracranial vascular flow voids are preserved. Skull and upper  cervical spine: Unremarkable bone marrow signal. Sinuses/Orbits: Unremarkable orbits. Minimal scattered paranasal sinus mucosal thickening. Clear mastoid air cells. Other: None. IMPRESSION: 1. No acute  intracranial abnormality. 2. Numerous chronic microhemorrhages in the cerebellum, brainstem, and cerebral hemispheres which could reflect the sequelae of chronic hypertension or cerebral amyloid angiopathy. 3. 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 Electronically Signed   By: Logan Bores M.D.   On: 08/05/2017 13:29   Ct Cerebral Perfusion W Contrast  Result Date: 08/31/2017 CLINICAL DATA:  72 y/o  M; left-sided weakness and slurred speech. EXAM: CT ANGIOGRAPHY HEAD AND NECK CT PERFUSION BRAIN TECHNIQUE: Multidetector CT imaging of the head and neck was performed using the standard protocol during bolus administration of intravenous contrast. Multiplanar CT image reconstructions and MIPs were obtained to evaluate the vascular anatomy. Carotid stenosis measurements (when applicable) are obtained utilizing NASCET criteria, using the distal internal carotid diameter as the denominator. Multiphase CT imaging of the brain was performed following IV bolus contrast injection. Subsequent parametric perfusion maps were calculated using RAPID software. CONTRAST:  144mL ISOVUE-370 IOPAMIDOL (ISOVUE-370) INJECTION 76% COMPARISON:  08/31/2017 CT of the head. FINDINGS: CTA NECK FINDINGS Aortic arch: Left vertebral artery arises from the aortic arch. Imaged portion shows no evidence of aneurysm or dissection. No significant stenosis of the major arch vessel origins. Mild fibrofatty plaque. 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 No high-grade stenosis or dissection. Left carotid system: No evidence of dissection, stenosis (50% or greater) or occlusion. Vertebral arteries: Right dominant. No evidence of dissection, stenosis (50% or greater) or occlusion. Skeleton: Mild multilevel cervical spondylosis. No high-grade bony canal stenosis. Other neck: Negative. Upper chest:  Negative. Review of the MIP images confirms the above findings CTA HEAD FINDINGS Anterior circulation: No significant stenosis, proximal occlusion, aneurysm, or vascular malformation. Right cavernous ICA fibrofatty plaque with mild stenosis. Posterior circulation: No significant stenosis, proximal occlusion, aneurysm, or vascular malformation. Venous sinuses: As permitted by contrast timing, patent. Anatomic variants: Bilateral posterior communicating arteries. No anterior communicating artery, likely hypoplastic or absent. Delayed phase: No abnormal intracranial enhancement. Review of the MIP images confirms the above findings CT Brain Perfusion Findings: CBF (<30%) Volume: 53mL Perfusion (Tmax>6.0s) volume: 101mL Mismatch Volume: 47mL Infarction Location:Negative. IMPRESSION: 1. Patent carotid and vertebral arteries. No dissection or hemodynamically significant stenosis utilizing NASCET criteria. 2. Patent anterior and posterior intracranial circulation. No large vessel occlusion, aneurysm, or significant stenosis. 3. 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 4. Negative CT brain perfusion. These results were called by telephone at the time of interpretation on 08/31/2017 at 11:49 pm to Dr. Amie Portland , who verbally acknowledged these results. Electronically Signed   By: Kristine Garbe M.D.   On: 08/31/2017 23:49   Ct Head Code Stroke Wo Contrast  Result Date: 08/31/2017 CLINICAL DATA:  Code stroke. 72 y/o M; left-sided weakness and slurred speech. EXAM: CT HEAD WITHOUT CONTRAST TECHNIQUE: Contiguous axial images were obtained from the base of the skull through the vertex without intravenous contrast. COMPARISON:  08/05/2017 MRI head.  07/25/2017 CT head. FINDINGS: Brain: No evidence of acute infarction, hemorrhage, hydrocephalus, extra-axial collection or mass lesion/mass effect. Stable chronic microvascular ischemic changes and parenchymal volume loss of the brain given differences in  technique. Vascular: No hyperdense vessel or unexpected calcification. Skull: Normal. Negative for fracture or focal  lesion. Sinuses/Orbits: No acute finding. Other: None. ASPECTS [facility] Stroke Program Early CT Score) - Ganglionic level infarction (caudate, lentiform nuclei, internal capsule, insula, M1-M3 cortex): 7 - Supraganglionic infarction (M4-M6 cortex): 3 Total score (0-10 with 10 being normal): 10 IMPRESSION: 1. No acute intracranial abnormality identified. 2. Stable chronic microvascular ischemic changes and parenchymal volume loss of the brain given differences in technique. 3. ASPECTS is 10 These results were communicated to Dr. Rory Percy at 11:20 pmon 4/5/2019by text page via the [facility] messaging system. Electronically Signed   By: Kristine Garbe M.D.   On: 08/31/2017 23:20    Time Spent in minutes  30   Jani Gravel M.D on 09/01/2017 at 7:53 AM  Between 7am to 7pm - Pager - [phone]  After 7pm go to [email or web address] - password [email or web address]  [facility] -  Office  [phone]

## 2017-09-01 NOTE — Progress Notes (Signed)
STROKE TEAM PROGRESS NOTE   HISTORY OF PRESENT ILLNESS (per record) John Shields is a 72 y.o. male past medical history of hypertension, renal cell cancer, prostate cancer, urinary retention, who has self-reported history of strokes (was never symptomatic from strokes but was told that he has had strokes in the past based on brain imaging prior) who was in his usual state of health till 9:15 PM on 08/31/2017 when he started noticing sudden onset of left arm weakness. EMS was called.  He also reported left-sided numbness and slurred speech. EMS did not appreciate any facial droop but did appreciate left-sided weakness. His gait was normal as he was getting onto the stretcher per EMS but they said that when he stood up from the lying position, his weakness and numbness subjectively got worse. On arrival here at [facility], he was seen and examined at the bridge.  He demonstrated bilateral leg drift but no other abnormalities on exam. He was taken in for a stat CT of the head and a CT angiogram-see below. He reports that he has had multiple strokes in the past which he came to know off after a brain MRI was done last time he presented to [facility] for ataxia.  He has never had symptoms related to strokes.  Of note, chart review reveals that he has been evaluated multiple times for ataxia neurological symptoms.  MRI has been negative for any acute change but has shown extensive migraine which is both supra and infratentorially. Last time when he presented with ataxia and got the MRI, I did not reveal a stroke but his workup revealed a UTI.  LKW: 9:15 PM on 08/31/2017 tpa given?: no, low NIH, extensive micro bleeds on prior MRI Premorbid modified Rankin scale (mRS): 1     SUBJECTIVE (INTERVAL HISTORY) No family member present.  The patient feels he is improving.  Somewhat dysarthric but was able to give a history and follow commands.    OBJECTIVE Temp:  [97.9 F (36.6 C)-98.3 F (36.8  C)] 98 F (36.7 C) (04/06 1148) Pulse Rate:  [76-82] 79 (04/06 1148) Cardiac Rhythm: Normal sinus rhythm (04/06 0800) Resp:  [17-23] 18 (04/06 1148) BP: (115-157)/(75-99) 132/75 (04/06 1148) SpO2:  [93 %-99 %] 98 % (04/06 1148) Weight:  [217 lb 13 oz (98.8 kg)-222 lb 14.2 oz (101.1 kg)] 217 lb 13 oz (98.8 kg) (04/06 0208)  CBC:  Recent Labs  Lab 08/31/17 2300 08/31/17 2306  WBC 6.2  --   NEUTROABS 2.4  --   HGB 13.1 15.0  HCT 40.9 44.0  MCV 73.4*  --   PLT 183  --     Basic Metabolic Panel:  Recent Labs  Lab 08/31/17 2300 08/31/17 2306  NA 138 138  K 4.7 4.7  CL 100* 101  CO2 27  --   GLUCOSE 115* 112*  BUN 22* 26*  CREATININE 1.06 1.10  CALCIUM 9.6  --     Lipid Panel:     Component Value Date/Time   CHOL 211 (H) 09/01/2017 0359   TRIG 45 09/01/2017 0359   HDL 63 09/01/2017 0359   CHOLHDL 3.3 09/01/2017 0359   VLDL 9 09/01/2017 0359   LDLCALC 139 (H) 09/01/2017 0359   HgbA1c:  Lab Results  Component Value Date   HGBA1C 5.9 (H) 09/01/2017   Urine Drug Screen:     Component Value Date/Time   LABOPIA NONE DETECTED 08/31/2017 2348   COCAINSCRNUR NONE DETECTED 08/31/2017 2348   LABBENZ NONE  DETECTED 08/31/2017 2348   AMPHETMU NONE DETECTED 08/31/2017 2348   THCU NONE DETECTED 08/31/2017 2348   LABBARB NONE DETECTED 08/31/2017 2348    Alcohol Level     Component Value Date/Time   ETH <10 08/31/2017 2300    IMAGING  Ct Angio Head W Or Wo Contrast Ct Angio Neck W Or Wo Contrast Ct Cerebral Perfusion W Contrast 08/31/2017 IMPRESSION:  1. Patent carotid and vertebral arteries. No dissection or hemodynamically significant stenosis utilizing NASCET criteria.  2. Patent anterior and posterior intracranial circulation. No large vessel occlusion, aneurysm, or significant stenosis.  3. 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  4. Negative CT brain perfusion.     Mr Brain Wo Contrast 09/01/2017 IMPRESSION:  1. Small foci of acute/early  subacute infarction within the right putamen extending into corona radiata and right caudate body. No associated acute hemorrhage or mass effect.  2. Stable background of chronic microvascular ischemic changes, parenchymal volume loss, and chronic lacunar infarcts.  3. Stable numerous foci of chronic microhemorrhage throughout ACA and PCA distributions.     Ct Head Code Stroke Wo Contrast 08/31/2017 IMPRESSION:  1. No acute intracranial abnormality identified.  2. Stable chronic microvascular ischemic changes and parenchymal volume loss of the brain given differences in technique.  3. ASPECTS is 10    Transthoracic Echocardiogram - pending      PHYSICAL EXAM Vitals:   09/01/17 0400 09/01/17 0600 09/01/17 0918 09/01/17 1148  BP: (!) 145/79 119/90 (!) 153/84 132/75  Pulse: 81 76 79 79  Resp:  20 18 18   Temp: 98.3 F (36.8 C) 98.3 F (36.8 C) 97.9 F (36.6 C) 98 F (36.7 C)  TempSrc: Oral Oral Oral Oral  SpO2: 95% 95% 99% 98%  Weight:      Height:       Pleasant elderly Caucasian male currently not in distress.  Right native kidney kidney edema . Afebrile. Head is nontraumatic. Neck is supple without bruit.    Cardiac exam no murmur or gallop. Lungs are clear to auscultation. Distal pulses are well felt.  Neurological Exam :  Awake alert oriented x 3 normal speech and language. Mild left lower face asymmetry. Tongue midline. No drift. Mild diminished fine finger movements on left. Orbits right over left upper extremity. Mild left grip weak.. Normal sensation . Normal coordination.  ASSESSMENT/PLAN [name] is a 72 y.o. male with history of hypertension, urinary retention, previous strokes, renal carcinoma, thrombocytopenia, prostate cancer, left bundle branch block, and history of sepsis presenting with. He did not receive IV t-PA due to low NIH score with extensive micro bleeds by MRI.  Stroke:  subacute infarction within the right putamen - small vessel  disease.  Resultant   mild left hemiparesis  CT head - No acute intracranial abnormality identified  MRI head - Small foci of acute/early subacute infarction within the right putamen extending into corona radiata and right caudate body.  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   Carotid Doppler - CTA neck  2D Echo - pending  LDL - 139  HgbA1c - 5.9  VTE prophylaxis - Lovenox Fall precautions Diet Heart Room service appropriate? Yes; Fluid consistency: Thin  aspirin 81 mg daily prior to admission, now on aspirin 81 mg daily and clopidogrel 75 mg daily  Patient counseled to be compliant with his antithrombotic medications  Ongoing aggressive stroke risk factor management  Therapy recommendations:  pending  Disposition:  Pending  Hypertension  Stable . Permissive hypertension (OK if < 220/120) but gradually normalize in 5-7 days . 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 Consider consult Dr Estanislado Pandy.    Plan / Recommendations   Stroke workup: awaiting echo  Therapy Follow Up: pending  Disposition: pending  Antiplatelet / Anticoagulation: ASA 81 mg and Plavix 75 mg QD x 3 weeks then Plavix alone.  Statin: now on Lipitor 80 mg daily  - continue.  MD Follow Up: [facility] 6 weeks  Further risk factor modification per primary care MD: Follow Up 2 weeks   Hospital day # 0  Mikey Bussing PA-C [facility] Pager [phone] 09/01/2017, 2:31 PM I have personally examined this patient, reviewed notes, independently  viewed imaging studies, participated in medical decision making and plan of care.ROS completed by me personally and pertinent positives fully documented  I have made any additions or clarifications directly to the above note. Agree with note above.  He presented with left-sided weakness secondary to right brain subcortical infarct from small vessel disease.  Recommend continue ongoing stroke workup and dual antiplatelet therapy of aspirin 81 and Plavix 75 mg daily for 3 weeks followed by Plavix alone.  Patient also has asymptomatic 12 mm right mid ICA saccular aneurysm for which recommend outpatient follow-up with Dr. Estanislado Pandy.  Long discussion with the patient about his lacunar infarct and aneurysm and answered questions.  Discussed with Dr. Maudie Mercury.  Greater than 50% time during this 35-minute visit was spent on counseling and coordination of care about his lacunar infarct, carotid aneurysm and answering questions.  Antony Contras, MD Medical Director [facility] Pager: [phone] 09/01/2017 4:45 PM   To contact Stroke Continuity provider, please refer to [email or web address] After hours, contact General Neurology

## 2017-09-01 NOTE — Evaluation (Signed)
Occupational Therapy Evaluation Patient Details Name: John Shields MRN: [id number] DOB: 06-27-1945 Today's Date: 09/01/2017    History of Present Illness 72 y.o. male with medical history significant for metastatic prostate cancer status post bilateral orchiectomy, right renal cell carcinoma, and hypertension, presented to the ED for evaluation of weakness and slurred speech. MRI brain => small foci of acute/ear subacute infarction in the right putamen extending into the c orona radiata and right caudate body, no hemorrhage   Clinical Impression   Pt admitted with the above diagnoses and presents with below problem list. Pt will benefit from continued acute OT to address the below listed deficits and maximize independence with basic ADLs prior to d/c home. PTA pt was independent to mod I with ADLs and lives alone. Pt is currently close min guard with functional mobility/transfers and LB ADLs (endorses baseline difficulty accessing feet). Pt reporting numbness in LUE after completing oral care standing at sink. He also describes feeling like "my head is swimming in [place]" once OOB. He did endorse, more than once, feeling unsteady while standing. No overt LOB during session. He feels strength and coordination of LUE are back to baseline.      Follow Up Recommendations  Home health OT;Supervision - Intermittent    Equipment Recommendations  3 in 1 bedside commode    Recommendations for Other Services PT consult     Precautions / Restrictions Precautions Precautions: Fall Restrictions Weight Bearing Restrictions: No      Mobility Bed Mobility Overal bed mobility: Needs Assistance Bed Mobility: Supine to Sit;Sit to Supine     Supine to sit: Min guard Sit to supine: Min guard   General bed mobility comments: pt using bed rail and momentum to advance to EOB. Min guard to control descent back into bed at end of session.   Transfers Overall transfer level: Needs  assistance Equipment used: None Transfers: Sit to/from Stand Sit to Stand: Min guard         General transfer comment: pt reporting "not feeling great" standing, feels unsteady.     Balance Overall balance assessment: Needs assistance Sitting-balance support: No upper extremity supported;Feet supported Sitting balance-Leahy Scale: Fair Sitting balance - Comments: LOB posteriorly while trying to remove socks.    Standing balance support: No upper extremity supported Standing balance-Leahy Scale: Fair Standing balance comment: close min guard. Pt reporting feeling unsteady.                           ADL either performed or assessed with clinical judgement   ADL Overall ADL's : Needs assistance/impaired Eating/Feeding: Set up;Sitting   Grooming: Oral care;Min guard;Standing Grooming Details (indicate cue type and reason): close min guard for safety with pt reporting feeling unsteady. Pt able to manipulate containers and manage [facility] tasks with no perceived difficulty.  Upper Body Bathing: Set up;Sitting   Lower Body Bathing: Min guard;Minimal assistance;Sit to/from stand Lower Body Bathing Details (indicate cue type and reason): assist to reach feet in seated position.  Upper Body Dressing : Set up;Sitting   Lower Body Dressing: Min guard;Minimal assistance;Sit to/from stand Lower Body Dressing Details (indicate cue type and reason): difficulty accessing feet Toilet Transfer: Min guard;Ambulation;Comfort height toilet   Toileting- Clothing Manipulation and Hygiene: Min guard;Sit to/from stand   Tub/ Shower Transfer: Tub transfer;Min guard;Minimal assistance;Ambulation   Functional mobility during ADLs: Min guard General ADL Comments: Pt completed bed mobility, in-room functional mobility and grooming task standing at sink. Close  min guard for dynamic standing tasks. Pt with difficulty accessing feet in seated position which he reports is largely baseline.       Vision Patient Visual Report: No change from baseline       Perception     Praxis      Pertinent Vitals/Pain Pain Assessment: Faces Faces Pain Scale: No hurt     Hand Dominance Left   Extremity/Trunk Assessment Upper Extremity Assessment Upper Extremity Assessment: Generalized weakness;LUE deficits/detail LUE Deficits / Details: pt reports numbness in hands at end of oral care. He feels strength and coordination are now at baseline. Does endorse general proprioceptive deficits especially with mobility.   Lower Extremity Assessment Lower Extremity Assessment: Defer to PT evaluation       Communication Communication Communication: slurred speech at times   Cognition Arousal/Alertness: Awake/alert Behavior During Therapy: [facility] for tasks assessed/performed Overall Cognitive Status: Within Functional Limits for tasks assessed                                     General Comments       Exercises     Shoulder Instructions      Home Living Family/patient expects to be discharged to:: Private residence Living Arrangements: Alone Available Help at Discharge: Friend(s);Available PRN/intermittently Type of Home: Apartment Home Access: Level entry     Home Layout: One level     Bathroom Shower/Tub: Tub/shower unit;Curtain   Bathroom Toilet: Standard     Home Equipment: None          Prior Functioning/Environment Level of Independence: Independent        Comments: drives occasionally        OT Problem List: Decreased activity tolerance;Impaired balance (sitting and/or standing);Decreased knowledge of use of DME or AE;Decreased knowledge of precautions;Impaired sensation;Decreased coordination      OT Treatment/Interventions: Self-care/ADL training;Neuromuscular education;DME and/or AE instruction;Therapeutic activities;Patient/family education;Balance training    OT Goals(Current goals can be found in the care plan section) Acute  Rehab OT Goals Patient Stated Goal: regain/maintain independence OT Goal Formulation: With patient Time For Goal Achievement: 09/08/17 Potential to Achieve Goals: Good ADL Goals Pt Will Perform Grooming: Independently;standing Pt Will Transfer to Toilet: with modified independence;ambulating Pt Will Perform Toileting - Clothing Manipulation and hygiene: with modified independence;sit to/from stand Pt Will Perform Tub/Shower Transfer: Tub transfer;with supervision;ambulating  OT Frequency: Min 2X/week   Barriers to D/C:            Co-evaluation              AM-PAC PT "6 Clicks" Daily Activity     Outcome Measure Help from another person eating meals?: None Help from another person taking care of personal grooming?: A Little Help from another person toileting, which includes using toliet, bedpan, or urinal?: A Little Help from another person bathing (including washing, rinsing, drying)?: A Little Help from another person to put on and taking off regular upper body clothing?: None Help from another person to put on and taking off regular lower body clothing?: A Little 6 Click Score: 20   End of Session Equipment Utilized During Treatment: Gait belt Nurse Communication: Mobility status  Activity Tolerance: Patient tolerated treatment well Patient left: in bed;with call bell/phone within reach;with bed alarm set  OT Visit Diagnosis: Unsteadiness on feet (R26.81);Muscle weakness (generalized) (M62.81);Other symptoms and signs involving the nervous system (R29.898)  Time: 1005-1040 OT Time Calculation (min): 35 min Charges:  OT General Charges $OT Visit: 1 Visit OT Evaluation $OT Eval Low Complexity: 1 Low OT Treatments $Self Care/Home Management : 8-22 mins G-Codes:       Hortencia Pilar 09/01/2017, 11:14 AM

## 2017-09-01 NOTE — H&P (Signed)
History and Physical    Bilbo Carcamo ZSW:[id number] DOB: 17-Sep-1945 DOA: 08/31/2017  PCP: [facility]   Patient coming from: Home  Chief Complaint: Weakness, slurred speech   HPI: John Shields is a 72 y.o. male with medical history significant for metastatic prostate cancer status post bilateral orchiectomy, right renal cell carcinoma, and hypertension, now presenting to the emergency department for evaluation of weakness and slurred speech.  Patient reports that he was in his usual state of health when he sat down on his couch at approximately 8:45 PM, but when he tried to stand at approximately 9:15 PM, he was having difficulty that he attributed to the left-sided weakness and numbness.  He also reports difficulty with speech, noting that he was slurring his words.  There was no headache, change in vision or hearing, chest pain, or palpitations.  Reports that symptoms are improving, but not resolved.  He denies history of stroke per se, but reports that there was evidence of such on a prior MRI.  ED Course: Upon arrival to the ED, patient is found to be afebrile, saturating well on room air, and with vitals otherwise normal.  EKG features a sinus rhythm and chronic left bundle branch block.  Noncontrast head CT is negative for acute intracranial abnormality.  Chemistry panel is unremarkable, CBC is notable for microcytosis without anemia, INR is normal, and troponin is undetectable.  Urinalysis is suggestive of infection.  Neurology was consulted by the ED physician and recommends a medical admission for ongoing evaluation and management of possible CVA/TIA.  Review of Systems:  All other systems reviewed and apart from HPI, are negative.  Past Medical History:  Diagnosis Date  . Foley catheter in place   . History of sepsis 12/27/2016   sepsis from prostate abscess post prostate bx  . Hypertension   . LBBB (left bundle branch block)   . Prostate cancer, primary, with  metastasis from prostate to other site [facility]) urologist-  dr Tresa Moore   dx 07/ 2018 bx-- Gleason 8, PSA 400, vol 323ml-- abd. lymphadenopathy  . Renal cancer, right ([place])   . Thrombocytopenia ([place])   . Urine retention    intermittant since 2016    Past Surgical History:  Procedure Laterality Date  . NO PAST SURGERIES    . ORCHIECTOMY Bilateral 01/31/2017   Procedure: ORCHIECTOMY;  Surgeon: Alexis Frock, MD;  Location: [facility];  Service: Urology;  Laterality: Bilateral;     reports that he has never smoked. He has never used smokeless tobacco. He reports that he does not drink alcohol or use drugs.  No Known Allergies  Family History  Problem Relation Age of Onset  . Heart attack Father   . Heart attack Brother      Prior to Admission medications   Medication Sig Start Date End Date Taking? Authorizing Provider  amLODipine (NORVASC) 5 MG tablet Take 1 tablet (5 mg total) by mouth daily. 01/03/17 09/01/23 Yes Johnson, Clanford L, MD  aspirin EC 81 MG tablet Take 81 mg by mouth 2 (two) times daily.   Yes [provider]  lisinopril (PRINIVIL,ZESTRIL) 20 MG tablet Take 1 tablet (20 mg total) by mouth daily. 01/03/17 08/31/24 Yes Johnson, Clanford L, MD  metoprolol succinate (TOPROL-XL) 25 MG 24 hr tablet Take 1 tablet (25 mg total) by mouth daily. 01/03/17 08/31/24 Yes Johnson, Clanford L, MD  tamsulosin (FLOMAX) 0.4 MG CAPS capsule Take 0.4 mg by mouth daily.    Yes [provider]  meclizine (ANTIVERT) 25 MG tablet Take 1 tablet (25 mg total) by mouth 3 (three) times daily as needed for dizziness. Patient not taking: Reported on 08/31/2017 08/05/17   Fredia Sorrow, MD  polyethylene glycol [facility] / Floria Raveling) packet Take 17 g by mouth daily. Patient not taking: Reported on 03/07/2017 06/10/16   Modena Jansky, MD    Physical Exam: Vitals:   08/31/17 2330 08/31/17 2345 09/01/17 0000 09/01/17 0007  BP: (!) 157/82 (!) 144/81 (!) 115/99   Pulse: 80 77 82     Resp: 18 17 (!) 23   Temp:    98.3 F (36.8 C)  SpO2: 98% 96% 93%   Weight:      Height:          Constitutional: NAD, calm, comfortable Eyes: PERTLA, lids and conjunctivae normal ENMT: Mucous membranes are moist. Posterior pharynx clear of any exudate or lesions.   Neck: normal, supple, no masses, no thyromegaly Respiratory: clear to auscultation bilaterally, no wheezing, no crackles. Normal respiratory effort.   Cardiovascular: S1 & S2 heard, regular rate and rhythm. No significant JVD. Abdomen: No distension, no tenderness, no masses palpated. Bowel sounds normal.  Musculoskeletal: no clubbing / cyanosis. No joint deformity upper and lower extremities.    Skin: no significant rashes, lesions, ulcers. Warm, dry, well-perfused. Neurologic: No facial asymmetry, slight dysarthria. Sensation to light touch diminished in LUE and left lower face. Strength 5/5 in all 4 limbs.  Psychiatric:  Alert and oriented x 3. Pleasant, cooperative.     Labs on Admission: I have personally reviewed following labs and imaging studies  CBC: Recent Labs  Lab 08/31/17 2300 08/31/17 2306  WBC 6.2  --   NEUTROABS 2.4  --   HGB 13.1 15.0  HCT 40.9 44.0  MCV 73.4*  --   PLT 183  --    Basic Metabolic Panel: Recent Labs  Lab 08/31/17 2300 08/31/17 2306  NA 138 138  K 4.7 4.7  CL 100* 101  CO2 27  --   GLUCOSE 115* 112*  BUN 22* 26*  CREATININE 1.06 1.10  CALCIUM 9.6  --    GFR: Estimated Creatinine Clearance: 75.8 mL/min (by C-G formula based on SCr of 1.1 mg/dL). Liver Function Tests: Recent Labs  Lab 08/31/17 2300  AST 26  ALT 21  ALKPHOS 61  BILITOT 0.5  PROT 7.7  ALBUMIN 4.3   No results for input(s): LIPASE, AMYLASE in the last 168 hours. No results for input(s): AMMONIA in the last 168 hours. Coagulation Profile: Recent Labs  Lab 08/31/17 2300  INR 0.89   Cardiac Enzymes: No results for input(s): CKTOTAL, CKMB, CKMBINDEX, TROPONINI in the last 168 hours. BNP  (last 3 results) No results for input(s): PROBNP in the last 8760 hours. HbA1C: No results for input(s): HGBA1C in the last 72 hours. CBG: Recent Labs  Lab 08/31/17 2300  GLUCAP 109*   Lipid Profile: No results for input(s): CHOL, HDL, LDLCALC, TRIG, CHOLHDL, LDLDIRECT in the last 72 hours. Thyroid Function Tests: No results for input(s): TSH, T4TOTAL, FREET4, T3FREE, THYROIDAB in the last 72 hours. Anemia Panel: No results for input(s): VITAMINB12, FOLATE, FERRITIN, TIBC, IRON, RETICCTPCT in the last 72 hours. Urine analysis:    Component Value Date/Time   COLORURINE YELLOW 08/31/2017 2348   APPEARANCEUR HAZY (A) 08/31/2017 2348   LABSPEC 1.019 08/31/2017 2348   PHURINE 6.0 08/31/2017 2348   GLUCOSEU NEGATIVE 08/31/2017 2348   HGBUR MODERATE (A) 08/31/2017 2348   BILIRUBINUR NEGATIVE 08/31/2017  [place] 08/31/2017 2348   PROTEINUR 30 (A) 08/31/2017 2348   UROBILINOGEN 0.2 11/30/2014 1942   NITRITE POSITIVE (A) 08/31/2017 2348   LEUKOCYTESUR LARGE (A) 08/31/2017 2348   Sepsis Labs: @LABRCNTIP (procalcitonin:4,lacticidven:4) )No results found for this or any previous visit (from the past 240 hour(s)).   Radiological Exams on Admission: Ct Angio Head W Or Wo Contrast  Result Date: 08/31/2017 CLINICAL DATA:  72 y/o  M; left-sided weakness and slurred speech. EXAM: CT ANGIOGRAPHY HEAD AND NECK CT PERFUSION BRAIN TECHNIQUE: Multidetector CT imaging of the head and neck was performed using the standard protocol during bolus administration of intravenous contrast. Multiplanar CT image reconstructions and MIPs were obtained to evaluate the vascular anatomy. Carotid stenosis measurements (when applicable) are obtained utilizing NASCET criteria, using the distal internal carotid diameter as the denominator. Multiphase CT imaging of the brain was performed following IV bolus contrast injection. Subsequent parametric perfusion maps were calculated using RAPID software.  CONTRAST:  19mL ISOVUE-370 IOPAMIDOL (ISOVUE-370) INJECTION 76% COMPARISON:  08/31/2017 CT of the head. FINDINGS: CTA NECK FINDINGS Aortic arch: Left vertebral artery arises from the aortic arch. Imaged portion shows no evidence of aneurysm or dissection. No significant stenosis of the major arch vessel origins. Mild fibrofatty plaque. 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 No high-grade stenosis or dissection. Left carotid system: No evidence of dissection, stenosis (50% or greater) or occlusion. Vertebral arteries: Right dominant. No evidence of dissection, stenosis (50% or greater) or occlusion. Skeleton: Mild multilevel cervical spondylosis. No high-grade bony canal stenosis. Other neck: Negative. Upper chest: Negative. Review of the MIP images confirms the above findings CTA HEAD FINDINGS Anterior circulation: No significant stenosis, proximal occlusion, aneurysm, or vascular malformation. Right cavernous ICA fibrofatty plaque with mild stenosis. Posterior circulation: No significant stenosis, proximal occlusion, aneurysm, or vascular malformation. Venous sinuses: As permitted by contrast timing, patent. Anatomic variants: Bilateral posterior communicating arteries. No anterior communicating artery, likely hypoplastic or absent. Delayed phase: No abnormal intracranial enhancement. Review of the MIP images confirms the above findings CT Brain Perfusion Findings: CBF (<30%) Volume: 52mL Perfusion (Tmax>6.0s) volume: 54mL Mismatch Volume: 80mL Infarction Location:Negative. IMPRESSION: 1. Patent carotid and vertebral arteries. No dissection or hemodynamically significant stenosis utilizing NASCET criteria. 2. Patent anterior and posterior intracranial circulation. No large vessel occlusion, aneurysm, or significant stenosis. 3. 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 4. Negative CT  brain perfusion. These results were called by telephone at the time of interpretation on 08/31/2017 at 11:49 pm to Dr. Amie Portland , who verbally acknowledged these results. Electronically Signed   By: Kristine Garbe M.D.   On: 08/31/2017 23:49   Ct Angio Neck W Or Wo Contrast  Result Date: 08/31/2017 CLINICAL DATA:  72 y/o  M; left-sided weakness and slurred speech. EXAM: CT ANGIOGRAPHY HEAD AND NECK CT PERFUSION BRAIN TECHNIQUE: Multidetector CT imaging of the head and neck was performed using the standard protocol during bolus administration of intravenous contrast. Multiplanar CT image reconstructions and MIPs were obtained to evaluate the vascular anatomy. Carotid stenosis measurements (when applicable) are obtained utilizing NASCET criteria, using the distal internal carotid diameter as the denominator. Multiphase CT imaging of the brain was performed following IV bolus contrast injection. Subsequent parametric perfusion maps were calculated using RAPID software. CONTRAST:  120mL ISOVUE-370 IOPAMIDOL (ISOVUE-370) INJECTION 76% COMPARISON:  08/31/2017  CT of the head. FINDINGS: CTA NECK FINDINGS Aortic arch: Left vertebral artery arises from the aortic arch. Imaged portion shows no evidence of aneurysm or dissection. No significant stenosis of the major arch vessel origins. Mild fibrofatty plaque. 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 No high-grade stenosis or dissection. Left carotid system: No evidence of dissection, stenosis (50% or greater) or occlusion. Vertebral arteries: Right dominant. No evidence of dissection, stenosis (50% or greater) or occlusion. Skeleton: Mild multilevel cervical spondylosis. No high-grade bony canal stenosis. Other neck: Negative. Upper chest: Negative. Review of the MIP images confirms the above findings CTA HEAD FINDINGS Anterior circulation: No  significant stenosis, proximal occlusion, aneurysm, or vascular malformation. Right cavernous ICA fibrofatty plaque with mild stenosis. Posterior circulation: No significant stenosis, proximal occlusion, aneurysm, or vascular malformation. Venous sinuses: As permitted by contrast timing, patent. Anatomic variants: Bilateral posterior communicating arteries. No anterior communicating artery, likely hypoplastic or absent. Delayed phase: No abnormal intracranial enhancement. Review of the MIP images confirms the above findings CT Brain Perfusion Findings: CBF (<30%) Volume: 84mL Perfusion (Tmax>6.0s) volume: 66mL Mismatch Volume: 14mL Infarction Location:Negative. IMPRESSION: 1. Patent carotid and vertebral arteries. No dissection or hemodynamically significant stenosis utilizing NASCET criteria. 2. Patent anterior and posterior intracranial circulation. No large vessel occlusion, aneurysm, or significant stenosis. 3. 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 4. Negative CT brain perfusion. These results were called by telephone at the time of interpretation on 08/31/2017 at 11:49 pm to Dr. Amie Portland , who verbally acknowledged these results. Electronically Signed   By: Kristine Garbe M.D.   On: 08/31/2017 23:49   Ct Cerebral Perfusion W Contrast  Result Date: 08/31/2017 CLINICAL DATA:  72 y/o  M; left-sided weakness and slurred speech. EXAM: CT ANGIOGRAPHY HEAD AND NECK CT PERFUSION BRAIN TECHNIQUE: Multidetector CT imaging of the head and neck was performed using the standard protocol during bolus administration of intravenous contrast. Multiplanar CT image reconstructions and MIPs were obtained to evaluate the vascular anatomy. Carotid stenosis measurements (when applicable) are obtained utilizing NASCET criteria, using the distal internal carotid diameter as the denominator. Multiphase CT imaging of the brain was performed following IV bolus contrast injection. Subsequent parametric  perfusion maps were calculated using RAPID software. CONTRAST:  143mL ISOVUE-370 IOPAMIDOL (ISOVUE-370) INJECTION 76% COMPARISON:  08/31/2017 CT of the head. FINDINGS: CTA NECK FINDINGS Aortic arch: Left vertebral artery arises from the aortic arch. Imaged portion shows no evidence of aneurysm or dissection. No significant stenosis of the major arch vessel origins. Mild fibrofatty plaque. 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 No high-grade stenosis or dissection. Left carotid system: No evidence of dissection, stenosis (50% or greater) or occlusion. Vertebral arteries: Right dominant. No evidence of dissection, stenosis (50% or greater) or occlusion. Skeleton: Mild multilevel cervical spondylosis. No high-grade bony canal stenosis. Other neck: Negative. Upper chest: Negative. Review of the MIP images confirms the above findings CTA HEAD FINDINGS Anterior circulation: No significant stenosis, proximal occlusion, aneurysm, or vascular malformation. Right cavernous ICA fibrofatty plaque with mild stenosis. Posterior circulation: No significant stenosis, proximal occlusion, aneurysm, or vascular malformation. Venous sinuses: As permitted by contrast timing, patent. Anatomic variants: Bilateral posterior communicating arteries. No anterior communicating artery, likely  hypoplastic or absent. Delayed phase: No abnormal intracranial enhancement. Review of the MIP images confirms the above findings CT Brain Perfusion Findings: CBF (<30%) Volume: 19mL Perfusion (Tmax>6.0s) volume: 22mL Mismatch Volume: 108mL Infarction Location:Negative. IMPRESSION: 1. Patent carotid and vertebral arteries. No dissection or hemodynamically significant stenosis utilizing NASCET criteria. 2. Patent anterior and posterior intracranial circulation. No large vessel occlusion, aneurysm, or significant stenosis. 3. Right mid ICA  saccular aneurysm measuring up to 12 mm. 4. Negative CT brain perfusion. These results were called by telephone at the time of interpretation on 08/31/2017 at 11:49 pm to Dr. Amie Portland , who verbally acknowledged these results. Electronically Signed   By: Kristine Garbe M.D.   On: 08/31/2017 23:49   Ct Head Code Stroke Wo Contrast  Result Date: 08/31/2017 CLINICAL DATA:  Code stroke. 72 y/o M; left-sided weakness and slurred speech. EXAM: CT HEAD WITHOUT CONTRAST TECHNIQUE: Contiguous axial images were obtained from the base of the skull through the vertex without intravenous contrast. COMPARISON:  08/05/2017 MRI head.  07/25/2017 CT head. FINDINGS: Brain: No evidence of acute infarction, hemorrhage, hydrocephalus, extra-axial collection or mass lesion/mass effect. Stable chronic microvascular ischemic changes and parenchymal volume loss of the brain given differences in technique. Vascular: No hyperdense vessel or unexpected calcification. Skull: Normal. Negative for fracture or focal lesion. Sinuses/Orbits: No acute finding. Other: None. ASPECTS [facility] Stroke Program Early CT Score) - Ganglionic level infarction (caudate, lentiform nuclei, internal capsule, insula, M1-M3 cortex): 7 - Supraganglionic infarction (M4-M6 cortex): 3 Total score (0-10 with 10 being normal): 10 IMPRESSION: 1. No acute intracranial abnormality identified. 2. Stable chronic microvascular ischemic changes and parenchymal volume loss of the brain given differences in technique. 3. ASPECTS is 10 These results were communicated to Dr. Rory Percy at 11:20 pmon 4/5/2019by text page via the [facility] messaging system. Electronically Signed   By: Kristine Garbe M.D.   On: 08/31/2017 23:20    EKG: Independently reviewed. Sinus rhythm, chronic LBBB.   Assessment/Plan   1. Acute left-sided weakness  - Presents with acute left-sided weakness and numbness, as well as slurred speech  - No acute findings on head CT; right  mid-ICA saccular aneurysm noted on CTA, which is negative for significant stenosis, dissection, or LVO - Neurology is consulting and much appreciated  - Check MRI brain, echocardiogram, fasting lipids, and A1c  - Continue cardiac monitoring, frequent neuro checks, PT/OT/SLP evals  - Continue ASA, start high-intensity statin    2. UTI  - UA is suggestive of infection, possibly colonization of Foley  - Reports Foley changed monthly by [facility] RN - Possible that the acute neurologic sxs reflect recrudescence in old stroke sxs d/t acute UTI, so favor empiric treatment  - Culture urine, start cefepime    3. 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 Hypertension  - Hold lisinopril and Toprol initially while evaluating for possible acute ischemic CVA     DVT prophylaxis: Lovenox Code Status: Full  Family Communication: Discussed with patient Consults called: Neurology Admission status: Observation    Vianne Bulls, MD [facility] Pager [phone]  If 7PM-7AM, please contact night-coverage [email or web address] Password [email or web address]  09/01/2017, 12:40 AM

## 2017-09-01 NOTE — Evaluation (Addendum)
Physical Therapy Evaluation Patient Details Name: John Shields MRN: [id number] DOB: Jul 16, 1945 Today's Date: 09/01/2017   History of Present Illness  72 yo male with onset of early stroke to corona radiata, caudate body.  Has UTI, L side weakness, metastatic prostate CA, renal CA, HTN  Clinical Impression  Pt is up to walk with assistance and noted his difficulty controlling balance with no AD.  He liked using the IV pole but switched to walker with better results, and talked about how this is not a practical solution to gait to use IV pole.  He is motivated to try, was slowly shuffling and took a great deal of time to walk.  Will anticipate his transition to SNF care for assistance as he is home alone with significant difficulty controlling balance and low endurance.  Follow acutely for balance and endurance training for safety with mobility and to shorten the rehab stay.    Follow Up Recommendations CIR    Equipment Recommendations  None recommended by PT    Recommendations for Other Services       Precautions / Restrictions Precautions Precautions: Fall Restrictions Weight Bearing Restrictions: No      Mobility  Bed Mobility Overal bed mobility: Needs Assistance Bed Mobility: Supine to Sit;Sit to Supine     Supine to sit: Min guard Sit to supine: Min guard   General bed mobility comments: pt using bed rail and momentum to advance to EOB. Min guard to control descent back into bed at end of session.   Transfers Overall transfer level: Needs assistance Equipment used: None Transfers: Sit to/from Stand Sit to Stand: Min guard;Min assist         General transfer comment: pt reporting "not feeling great" standing, feels unsteady.   Ambulation/Gait Ambulation/Gait assistance: Min guard Ambulation Distance (Feet): 40 Feet Assistive device: Rolling walker (2 wheeled);1 person hand held assist Gait Pattern/deviations: Step-through pattern;Decreased stride length;Wide  base of support Gait velocity: reduced Gait velocity interpretation: Below normal speed for age/gender General Gait Details: pt is walking wide based gait but also has R LE urine bag from catheter  Stairs            Wheelchair Mobility    Modified Rankin (Stroke Patients Only) Modified Rankin (Stroke Patients Only) Pre-Morbid Rankin Score: Slight disability Modified Rankin: Moderately severe disability     Balance Overall balance assessment: Needs assistance Sitting-balance support: Feet supported Sitting balance-Leahy Scale: Good     Standing balance support: Bilateral upper extremity supported;During functional activity Standing balance-Leahy Scale: Fair                               Pertinent Vitals/Pain Pain Assessment: No/denies pain    Home Living Family/patient expects to be discharged to:: Private residence Living Arrangements: Alone Available Help at Discharge: Friend(s);Available PRN/intermittently Type of Home: Apartment Home Access: Level entry     Home Layout: One level Home Equipment: None      Prior Function Level of Independence: Independent         Comments: drives occasionally     Hand Dominance   Dominant Hand: Left    Extremity/Trunk Assessment   Upper Extremity Assessment Upper Extremity Assessment: Generalized weakness    Lower Extremity Assessment Lower Extremity Assessment: Generalized weakness    Cervical / Trunk Assessment Cervical / Trunk Assessment: Normal  Communication   Communication: No difficulties(pt reports he hears his own speech as slurred)  Cognition Arousal/Alertness:  Awake/alert Behavior During Therapy: WFL for tasks assessed/performed Overall Cognitive Status: Within Functional Limits for tasks assessed                                        General Comments      Exercises     Assessment/Plan    PT Assessment Patient needs continued PT services  PT Problem List  Decreased strength;Decreased range of motion;Decreased activity tolerance;Decreased balance;Decreased mobility;Decreased coordination;Decreased cognition;Decreased knowledge of use of DME;Decreased safety awareness;Cardiopulmonary status limiting activity;Obesity;Decreased skin integrity       PT Treatment Interventions DME instruction;Gait training;Stair training;Functional mobility training;Therapeutic activities;Therapeutic exercise;Balance training;Neuromuscular re-education;Patient/family education    PT Goals (Current goals can be found in the Care Plan section)  Acute Rehab PT Goals Patient Stated Goal: regain/maintain independence PT Goal Formulation: With patient Time For Goal Achievement: 09/15/17 Potential to Achieve Goals: Good    Frequency Min 2X/week   Barriers to discharge Decreased caregiver support;Inaccessible home environment home alone with no usable AD    Co-evaluation               AM-PAC PT "6 Clicks" Daily Activity  Outcome Measure Difficulty turning over in bed (including adjusting bedclothes, sheets and blankets)?: A Little Difficulty moving from lying on back to sitting on the side of the bed? : A Little Difficulty sitting down on and standing up from a chair with arms (e.g., wheelchair, bedside commode, etc,.)?: A Lot Help needed moving to and from a bed to chair (including a wheelchair)?: A Little Help needed walking in hospital room?: A Little Help needed climbing 3-5 steps with a railing? : Total 6 Click Score: 15    End of Session Equipment Utilized During Treatment: Gait belt Activity Tolerance: Patient tolerated treatment well;Patient limited by fatigue;Treatment limited secondary to medical complications (Comment)(head did not "feel right" with mobility) Patient left: in bed;with call bell/phone within reach;with bed alarm set Nurse Communication: Mobility status PT Visit Diagnosis: Unsteadiness on feet (R26.81);Muscle weakness  (generalized) (M62.81);Difficulty in walking, not elsewhere classified (R26.2);Hemiplegia and hemiparesis Hemiplegia - Right/Left: Left Hemiplegia - dominant/non-dominant: Non-dominant Hemiplegia - caused by: Cerebral infarction    Time: 1355-1430 PT Time Calculation (min) (ACUTE ONLY): 35 min   Charges:         PT G Codes:        Ramond Dial 30-Sep-2017, 4:10 PM   Mee Hives, PT MS Acute Rehab Dept. Number: [place] and [place]

## 2017-09-01 NOTE — Progress Notes (Signed)
Patient had lots of questions and  Concerned about his star of [place] necklace lost-found it in his bed with nurses help.  Asked about possibly getting married.-chaplains are not able to help with that due to legal issues-cant get marriage license-2 people have to got to go to get the license. Had long conversation and he asked if I could come back to talk more.  I will try to do that on Monday.   Conard Novak, Chaplain   09/01/17 2000  Clinical Encounter Type  Visited With Patient  Visit Type Initial;Spiritual support;Other (Comment) (patient asked if he could be married here-no)  Referral From Nurse  Consult/Referral To [place] (Comment) (wanted to talk to chaplain-lots of questions)

## 2017-09-02 ENCOUNTER — Other Ambulatory Visit (HOSPITAL_COMMUNITY): Payer: Medicare Other

## 2017-09-02 ENCOUNTER — Encounter (HOSPITAL_COMMUNITY): Payer: Self-pay | Admitting: *Deleted

## 2017-09-02 DIAGNOSIS — G43909 Migraine, unspecified, not intractable, without status migrainosus: Secondary | ICD-10-CM | POA: Diagnosis present

## 2017-09-02 DIAGNOSIS — R4781 Slurred speech: Secondary | ICD-10-CM | POA: Diagnosis present

## 2017-09-02 DIAGNOSIS — G8194 Hemiplegia, unspecified affecting left nondominant side: Secondary | ICD-10-CM | POA: Diagnosis present

## 2017-09-02 DIAGNOSIS — I639 Cerebral infarction, unspecified: Secondary | ICD-10-CM | POA: Diagnosis not present

## 2017-09-02 DIAGNOSIS — I6339 Cerebral infarction due to thrombosis of other cerebral artery: Secondary | ICD-10-CM | POA: Diagnosis present

## 2017-09-02 DIAGNOSIS — Z8249 Family history of ischemic heart disease and other diseases of the circulatory system: Secondary | ICD-10-CM | POA: Diagnosis not present

## 2017-09-02 DIAGNOSIS — E785 Hyperlipidemia, unspecified: Secondary | ICD-10-CM | POA: Diagnosis present

## 2017-09-02 DIAGNOSIS — Z7982 Long term (current) use of aspirin: Secondary | ICD-10-CM | POA: Diagnosis not present

## 2017-09-02 DIAGNOSIS — N390 Urinary tract infection, site not specified: Secondary | ICD-10-CM | POA: Diagnosis not present

## 2017-09-02 DIAGNOSIS — B9620 Unspecified Escherichia coli [E. coli] as the cause of diseases classified elsewhere: Secondary | ICD-10-CM | POA: Diagnosis present

## 2017-09-02 DIAGNOSIS — I447 Left bundle-branch block, unspecified: Secondary | ICD-10-CM | POA: Diagnosis present

## 2017-09-02 DIAGNOSIS — C61 Malignant neoplasm of prostate: Secondary | ICD-10-CM | POA: Diagnosis present

## 2017-09-02 DIAGNOSIS — R531 Weakness: Secondary | ICD-10-CM | POA: Diagnosis not present

## 2017-09-02 DIAGNOSIS — C641 Malignant neoplasm of right kidney, except renal pelvis: Secondary | ICD-10-CM | POA: Diagnosis present

## 2017-09-02 DIAGNOSIS — I5030 Unspecified diastolic (congestive) heart failure: Secondary | ICD-10-CM | POA: Diagnosis not present

## 2017-09-02 DIAGNOSIS — Z8673 Personal history of transient ischemic attack (TIA), and cerebral infarction without residual deficits: Secondary | ICD-10-CM | POA: Diagnosis not present

## 2017-09-02 DIAGNOSIS — R29702 NIHSS score 2: Secondary | ICD-10-CM | POA: Diagnosis present

## 2017-09-02 DIAGNOSIS — D696 Thrombocytopenia, unspecified: Secondary | ICD-10-CM | POA: Diagnosis present

## 2017-09-02 DIAGNOSIS — N400 Enlarged prostate without lower urinary tract symptoms: Secondary | ICD-10-CM | POA: Diagnosis present

## 2017-09-02 DIAGNOSIS — I10 Essential (primary) hypertension: Secondary | ICD-10-CM | POA: Diagnosis present

## 2017-09-02 LAB — CBC
HCT: 38.3 % — ABNORMAL LOW (ref 39.0–52.0)
Hemoglobin: 12.2 g/dL — ABNORMAL LOW (ref 13.0–17.0)
MCH: 23.1 pg — AB (ref 26.0–34.0)
MCHC: 31.9 g/dL (ref 30.0–36.0)
MCV: 72.5 fL — AB (ref 78.0–100.0)
Platelets: 172 10*3/uL (ref 150–400)
RBC: 5.28 MIL/uL (ref 4.22–5.81)
RDW: 14.4 % (ref 11.5–15.5)
WBC: 6.1 10*3/uL (ref 4.0–10.5)

## 2017-09-02 LAB — COMPREHENSIVE METABOLIC PANEL
ALT: 20 U/L (ref 17–63)
ANION GAP: 11 (ref 5–15)
AST: 22 U/L (ref 15–41)
Albumin: 3.5 g/dL (ref 3.5–5.0)
Alkaline Phosphatase: 44 U/L (ref 38–126)
BILIRUBIN TOTAL: 0.6 mg/dL (ref 0.3–1.2)
BUN: 15 mg/dL (ref 6–20)
CO2: 24 mmol/L (ref 22–32)
Calcium: 9.4 mg/dL (ref 8.9–10.3)
Chloride: 103 mmol/L (ref 101–111)
Creatinine, Ser: 0.96 mg/dL (ref 0.61–1.24)
GFR calc Af Amer: >60 mL/min (ref >60)
Glucose, Bld: 94 mg/dL (ref 65–99)
POTASSIUM: 4.4 mmol/L (ref 3.5–5.1)
Sodium: 138 mmol/L (ref 135–145)
TOTAL PROTEIN: 6.3 g/dL — AB (ref 6.5–8.1)

## 2017-09-02 NOTE — Care Management Note (Signed)
Case Management Note  Patient Details  Name: John Shields MRN: [id number] Date of Birth: 03/10/1946  Subjective/Objective:       Pt presented for CVA with left sided weakness.  Pt lives alone and has no DME.  Pt completely independent PTA.            Action/Plan: Offered HH choice and patient states he has no preference but would choose [facility] since affiliated with hospital.  Pt does not think he needs any DME.    Expected Discharge Date:                  Expected Discharge Plan:  [place]  In-House Referral:  NA  Discharge planning Services  CM Consult  Post Acute Care Choice:  Home Health Choice offered to:  Patient  DME Arranged:  Patient refused services(Pt does not want 3n1 or other DME) DME Agency:  NA  HH Arranged:    [place] Agency:     Status of Service:  In process, will continue to follow  If discussed at Long Length of Stay Meetings, dates discussed:    Additional Comments:  Claudie Leach, RN 09/02/2017, 3:18 PM

## 2017-09-02 NOTE — Progress Notes (Signed)
Occupational Therapy Treatment Patient Details Name: John Shields MRN: [id number] DOB: Sep 27, 1945 Today's Date: 09/02/2017    History of present illness 72 yo male with onset of early stroke to corona radiata, caudate body.  Has UTI, L side weakness, metastatic prostate CA, renal CA, HTN   OT comments  Pt. Able to complete bed mobility, in room amb., LB dressing, and standing grooming tasks with min guard.  Focus and plan of next session is shower stall transfer in preparation for safe return home.   Follow Up Recommendations  Home health OT;Supervision - Intermittent    Equipment Recommendations  3 in 1 bedside commode    Recommendations for Other Services      Precautions / Restrictions Precautions Precautions: Fall       Mobility Bed Mobility Overal bed mobility: Needs Assistance Bed Mobility: Supine to Sit     Supine to sit: Supervision        Transfers Overall transfer level: Needs assistance Equipment used: Rolling walker (2 wheeled) Transfers: Sit to/from [facility] Sit to Stand: Min guard Stand pivot transfers: Min guard            Balance                                           ADL either performed or assessed with clinical judgement   ADL Overall ADL's : Needs assistance/impaired     Grooming: Wash/dry hands;Wash/dry face;Oral care;Min guard;Standing               Lower Body Dressing: Sitting/lateral leans;Minimal assistance Lower Body Dressing Details (indicate cue type and reason): able to cross L over R no issues, R over L pt. unable to maintain without min a to keep leg crossed over knee and also put sock over toes.               Functional mobility during ADLs: Min guard General ADL Comments: Pt completed bed mobility, in-room functional mobility and grooming task standing at sink. Close min guard for dynamic standing tasks. Pt with difficulty accessing feet in seated position which he reports  is largely baseline.      Vision       Perception     Praxis      Cognition Arousal/Alertness: Awake/alert Behavior During Therapy: WFL for tasks assessed/performed Overall Cognitive Status: Within Functional Limits for tasks assessed                                          Exercises     Shoulder Instructions       General Comments      Pertinent Vitals/ Pain       Pain Assessment: No/denies pain  Home Living                                          Prior Functioning/Environment              Frequency  Min 2X/week        Progress Toward Goals  OT Goals(current goals can now be found in the care plan section)  Progress towards OT goals: Progressing toward goals  Plan      Co-evaluation                 AM-PAC PT "6 Clicks" Daily Activity     Outcome Measure   Help from another person eating meals?: None Help from another person taking care of personal grooming?: A Little Help from another person toileting, which includes using toliet, bedpan, or urinal?: A Little Help from another person bathing (including washing, rinsing, drying)?: A Little Help from another person to put on and taking off regular upper body clothing?: None Help from another person to put on and taking off regular lower body clothing?: A Little 6 Click Score: 20    End of Session Equipment Utilized During Treatment: Gait belt;Rolling walker  OT Visit Diagnosis: Unsteadiness on feet (R26.81);Muscle weakness (generalized) (M62.81);Other symptoms and signs involving the nervous system (R29.898)   Activity Tolerance Patient tolerated treatment well   Patient Left in chair;with call bell/phone within reach   Nurse Communication Other (comment)(lead off and pt. reports he is amb. to/from b.room without assistance asked if chair alarm needed?)        Time: 6387-5643 OT Time Calculation (min): 17 min  Charges: OT General  Charges $OT Visit: 1 Visit OT Treatments $Self Care/Home Management : 8-22 mins   Janice Coffin, COTA/L 09/02/2017, 12:47 PM

## 2017-09-02 NOTE — Plan of Care (Signed)
  Problem: Safety: Goal: Ability to remain free from injury will improve Outcome: Progressing   Problem: Education: Goal: Knowledge of disease or condition will improve Outcome: Progressing   

## 2017-09-02 NOTE — Evaluation (Signed)
Speech Language Pathology Evaluation Patient Details Name: John Shields MRN: [id number] DOB: [birth year] Today's Date: 09/02/2017 Time: 5732-2025 SLP Time Calculation (min) (ACUTE ONLY): 27 min  Problem List:  Patient Active Problem List   Diagnosis Date Noted  . Acute left-sided weakness 09/01/2017  . Acute lower UTI 09/01/2017  . Cerebral thrombosis with cerebral infarction 09/01/2017  . Near syncope 07/25/2017  . Prostate cancer, primary, 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 [facility])   . Left bundle branch block (LBBB) on electrocardiogram   . H/O bilateral orchiectomies   . Dyspnea on exertion   . BPH (benign prostatic hyperplasia) 12/27/2016  . Renal mass 12/27/2016  . Thrombocytopenia ([place]) 12/27/2016  . Sepsis ([place]) 12/27/2016  . Elevated LFTs   . Lactic acidosis   . AKI (acute kidney injury) ([place])   . Urinary retention 06/07/2016  . Constipation 06/07/2016  . Acute renal failure (ARF) ([place]) 06/07/2016  . Essential hypertension 06/07/2016  . Hyperglycemia 06/07/2016   Past Medical History:  Past Medical History:  Diagnosis Date  . Foley catheter in place   . History of sepsis 12/27/2016   sepsis from prostate abscess post prostate bx  . Hypertension   . LBBB (left bundle branch block)   . Prostate cancer, primary, 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 [facility]) urologist-  dr Tresa Moore   dx 07/ 2018 bx-- Gleason 8, PSA 400, vol 3104ml-- abd. lymphadenopathy  . Renal cancer, right ([place])   . Thrombocytopenia ([place])   . Urine retention    intermittant since 2016   Past Surgical History:  Past Surgical History:  Procedure Laterality Date  . NO PAST SURGERIES    . ORCHIECTOMY Bilateral 01/31/2017   Procedure: ORCHIECTOMY;  Surgeon: Alexis Frock, MD;  Location: [facility];  Service: Urology;  Laterality: Bilateral;   HPI:  72 yo male with onset of early stroke to corona radiata, caudate body.  Has UTI, L side weakness, metastatic prostate CA,  renal CA, HTN   Assessment / Plan / Recommendation Clinical Impression  Pt presents with appropriate speech and language, although cognition was difficult to assess. Pt only completed a portion of the [place] before stopping, stating that it was not a good tool to assess his cognitive abilities. Of the subtests he did complete, he scored 5/21. On certain areas, like the visuospatial/executive functions subtest, he appeared to give good effort but did not seem to fully comrpehend the more complex instructions. On other sections, like memory, delayed recall orserial subtractions, he did not seem to put forth his full effort or in some instances did not try at all. In conversation he is tangential and often veers from one topic to another, and occasionally seems to repeat topics without awareness. I recommended HH SLP f/u to maximize his cognitive function and safety, although he declines any SLP f/u at this time. He is certain that he is at his baseline level of function, and he states that he has always had a "bad memory." If he will not participate in SLP services, I at least encouraged him to have supervision upon initial return home.     SLP Assessment  SLP Recommendation/Assessment: Patient needs continued Speech Lanaguage Pathology Services(but is refusing services) SLP Visit Diagnosis: Cognitive communication deficit (R41.841)    Follow Up Recommendations  Home health SLP;24 hour supervision/assistance;Other (comment)(pt declines HH SLP)    Frequency and Duration           SLP Evaluation Cognition  Overall Cognitive Status: Difficult  to assess Arousal/Alertness: Awake/alert Attention: Sustained Sustained Attention: Impaired Sustained Attention Impairment: Verbal basic Memory: Impaired Memory Impairment: Storage deficit;Retrieval deficit;Decreased recall of new information Problem Solving: Impaired Problem Solving Impairment: Verbal basic;Verbal complex       Comprehension  Auditory  Comprehension Overall Auditory Comprehension: Impaired Commands: Impaired Complex Commands: 25-49% accurate    Expression Expression Primary Mode of Expression: Verbal Verbal Expression Overall Verbal Expression: Appears within functional limits for tasks assessed   Oral / Motor  Motor Speech Overall Motor Speech: Appears within functional limits for tasks assessed   GO                    Germain Osgood 09/02/2017, 7:16 PM  Germain Osgood, M.A. CCC-SLP [phone]

## 2017-09-02 NOTE — Progress Notes (Addendum)
STROKE TEAM PROGRESS NOTE           SUBJECTIVE (INTERVAL HISTORY) The patient continues to do well.  He is anxious for discharge.  He is participating in therapy.    OBJECTIVE Temp:  [97.7 F (36.5 C)-98.2 F (36.8 C)] 97.7 F (36.5 C) (04/07 0734) Pulse Rate:  [76-93] 79 (04/07 0734) Cardiac Rhythm: Normal sinus rhythm (04/07 0800) Resp:  [16-18] 18 (04/07 0734) BP: (132-157)/(75-100) 157/85 (04/07 0734) SpO2:  [96 %-99 %] 96 % (04/07 0734)  CBC:  Recent Labs  Lab 08/31/17 2300 08/31/17 2306 09/02/17 0621  WBC 6.2  --  6.1  NEUTROABS 2.4  --   --   HGB 13.1 15.0 12.2*  HCT 40.9 44.0 38.3*  MCV 73.4*  --  72.5*  PLT 183  --  956    Basic Metabolic Panel:  Recent Labs  Lab 08/31/17 2300 08/31/17 2306 09/02/17 0621  NA 138 138 138  K 4.7 4.7 4.4  CL 100* 101 103  CO2 27  --  24  GLUCOSE 115* 112* 94  BUN 22* 26* 15  CREATININE 1.06 1.10 0.96  CALCIUM 9.6  --  9.4    Lipid Panel:     Component Value Date/Time   CHOL 211 (H) 09/01/2017 0359   TRIG 45 09/01/2017 0359   HDL 63 09/01/2017 0359   CHOLHDL 3.3 09/01/2017 0359   VLDL 9 09/01/2017 0359   LDLCALC 139 (H) 09/01/2017 0359   HgbA1c:  Lab Results  Component Value Date   HGBA1C 5.9 (H) 09/01/2017   Urine Drug Screen:     Component Value Date/Time   LABOPIA NONE DETECTED 08/31/2017 2348   COCAINSCRNUR NONE DETECTED 08/31/2017 2348   LABBENZ NONE DETECTED 08/31/2017 2348   AMPHETMU NONE DETECTED 08/31/2017 2348   THCU NONE DETECTED 08/31/2017 2348   LABBARB NONE DETECTED 08/31/2017 2348    Alcohol Level     Component Value Date/Time   ETH <10 08/31/2017 2300    IMAGING  Ct Angio Head W Or Wo Contrast Ct Angio Neck W Or Wo Contrast Ct Cerebral Perfusion W Contrast 08/31/2017 IMPRESSION:  1. Patent carotid and vertebral arteries. No dissection or hemodynamically significant stenosis utilizing NASCET criteria.  2. Patent anterior and posterior intracranial circulation. No large  vessel occlusion, aneurysm, or significant stenosis.  3. 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  4. Negative CT brain perfusion.     Mr Brain Wo Contrast 09/01/2017 IMPRESSION:  1. Small foci of acute/early subacute infarction within the right putamen extending into corona radiata and right caudate body. No associated acute hemorrhage or mass effect.  2. Stable background of chronic microvascular ischemic changes, parenchymal volume loss, and chronic lacunar infarcts.  3. Stable numerous foci of chronic microhemorrhage throughout ACA and PCA distributions.     Ct Head Code Stroke Wo Contrast 08/31/2017 IMPRESSION:  1. No acute intracranial abnormality identified.  2. Stable chronic microvascular ischemic changes and parenchymal volume loss of the brain given differences in technique.  3. ASPECTS is 10    Transthoracic Echocardiogram - pending      PHYSICAL EXAM Vitals:   09/01/17 2057 09/02/17 0007 09/02/17 0344 09/02/17 0734  BP: (!) 146/100 (!) 153/86 137/88 (!) 157/85  Pulse: 93 80 85 79  Resp: 18 16 16 18   Temp:  98.2 F (36.8 C)  97.7 F (36.5 C)  TempSrc:  Oral  Oral  SpO2: 99% 98% 96% 96%  Weight:  Height:       Pleasant elderly Caucasian male currently not in distress.  Right native kidney kidney edema . Afebrile. Head is nontraumatic. Neck is supple without bruit.    Cardiac exam no murmur or gallop. Lungs are clear to auscultation. Distal pulses are well felt.  Neurological Exam :  Awake alert oriented x 3 normal speech and language. Mild left lower face asymmetry. Tongue midline. No drift. Mild diminished fine finger movements on left. Orbits right over left upper extremity. Mild left grip weak.. Normal sensation . Normal coordination.  ASSESSMENT/PLAN [name] is a 72 y.o. male with history of hypertension, urinary retention, previous strokes, renal carcinoma, thrombocytopenia, prostate cancer, left bundle branch block, and history  of sepsis presenting with. He did not receive IV t-PA due to low NIH score with extensive micro bleeds by MRI.  Stroke:  subacute infarction within the right putamen - small vessel disease.  Resultant   mild left hemiparesis  CT head - No acute intracranial abnormality identified  MRI head - Small foci of acute/early subacute infarction within the right putamen extending into corona radiata and right caudate body.  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   Carotid Doppler - CTA neck  2D Echo - pending  LDL - 139  HgbA1c - 5.9  VTE prophylaxis - Lovenox Fall precautions Diet Heart Room service appropriate? Yes; Fluid consistency: Thin  aspirin 81 mg daily prior to admission, now on aspirin 81 mg daily and clopidogrel 75 mg daily  Patient counseled to be compliant with his antithrombotic medications  Ongoing aggressive stroke risk factor management  Therapy recommendations:  pending  Disposition:  Pending  Hypertension  Stable . Permissive hypertension (OK if < 220/120) but gradually normalize in 5-7 days . Long-term BP goal normotensive  Hyperlipidemia  Lipid lowering medication PTA:  none  LDL 139, goal < 70  Current lipid lowering medication: Lipitor 80 mg daily  Continue statin at discharge   Other Stroke Risk Factors  Advanced age  Obesity, Body mass index is 29.54 kg/m., recommend weight loss, diet and exercise as appropriate     Plan / Recommendations   Stroke workup: awaiting echo  Therapy Follow Up: pending  Disposition: pending  Antiplatelet / Anticoagulation: ASA 81 mg and Plavix 75 mg QD x 3 weeks then Plavix alone.  Statin: now on Lipitor 80 mg daily  - continue.  MD Follow Up: [facility] 6 weeks  Further risk factor modification per primary care MD: Follow Up 2 weeks  Right mid ICA aneurysm. Discussed with Dr Estanislado Pandy. He will consult outpatient if patient is discharged. In patient  goes to rehab he will see inpatient.  Hospital day # 0  Mikey Bussing PA-C [facility] Pager [phone] 09/02/2017, 11:33 AM    He presented with left-sided weakness secondary to right brain subcortical infarct from small vessel disease.  Recommend continue   dual antiplatelet therapy of aspirin 81 and Plavix 75 mg daily for 3 weeks followed by Plavix alone.  Patient also has asymptomatic 12 mm right mid ICA saccular aneurysm for which recommend outpatient follow-up with Dr. Estanislado Pandy.  Long discussion with the patient about his lacunar infarct and aneurysm and answered questions.   Stroke team will sign off. Follow-up as an outpatient in stroke clinic in 6 weeks.  Antony Contras, MD Medical Director [facility] Stroke Center Pager: [phone] 09/02/2017 11:33 AM  To contact Stroke Continuity provider, please refer to [email or web address] After hours, contact General Neurology

## 2017-09-02 NOTE — Progress Notes (Signed)
Patient ID: John Shields, male   DOB: 03/30/1946, 72 y.o.   MRN: [id number]                                                                PROGRESS NOTE                                                                                                                                                                                                             Patient Demographics:    John Shields, is a 72 y.o. male, DOB - February 19, 1946, JHE:[id number]  Admit date - 08/31/2017   Admitting Physician Vianne Bulls, MD  Outpatient Primary MD for the patient is [facility], [facility]  LOS - 0  Outpatient Specialists:      Chief Complaint  Patient presents with  . Code Stroke       Brief Narrative     72 y.o.malewith medical history significant formetastatic prostate cancer status post bilateral orchiectomy, right renal cell carcinoma,andhypertension,now presenting to the emergency department for evaluation of weakness and slurred speech. Patient reports that he was in his usual state of health when he sat down on his couch at approximately 8:45 PM, but when he tried to stand at approximately 9:15 PM, he was having difficulty that he attributed to the left-sided weakness and numbness. He also reports difficulty with speech, noting that he was slurring his words. There was no headache, change in vision or hearing, chest pain, orpalpitations.Reports that symptoms are improving, but not resolved. He denies history of stroke per se, but reports that there was evidence of such on a prior MRI.  ED Course:Upon arrival to the ED, patient is found to be afebrile, saturating well on room air, and with vitals otherwise normal. EKG features a sinus rhythm and chronic left bundle branch block. Noncontrast head CT is negative for acute intracranial abnormality. Chemistry panel is unremarkable, CBC is notable for microcytosis without anemia, INR is normal, and troponin is undetectable. Urinalysis is  suggestive of infection. Neurology was consulted by the ED physician and recommends a medical admission for ongoing evaluation and management of possible CVA/TIA.      Subjective:    John Shields today has improved speech.  Left weakness still present but much improved from admission.    No headache, No  chest pain, No abdominal pain - No Nausea,  No Cough - SOB.    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 Estanislado Pandy  3.UTI -UA is suggestive of infection, possibly colonization of Foley - Reports Foley changed monthly by [facility] RN -Possible that the acute neurologic sxs reflect recrudescence in old stroke sxs d/t acute UTI, so favor empiric treatment -Culture urine (pending) -Cont cefepime  4.Prostate cancer with metastases; right renal mass -Status-post bilateral orchiectomy -Catheter-dependent -Right renal mass suspected to be a primary RCC  5.Hypertension -Hold lisinopril and Toprol initially while  evaluating for possible acute ischemic CVA         Code Status : FULL CODE  Family Communication  : w patient  Disposition Plan  : home  Barriers For Discharge :   Consults  :  neurology  Procedures  : MRI brain  4/6 DVT Prophylaxis  :  Lovenox -  SCDs      Lab Results  Component Value Date   PLT 172 09/02/2017    Antibiotics  :  Cefepime 4/6=>  Anti-infectives (From admission, onward)   Start     Dose/Rate Route Frequency Ordered Stop   09/01/17 1200  ceFEPIme (MAXIPIME) 1 g in sodium chloride 0.9 % 100 mL IVPB     1 g 200 mL/hr over 30 Minutes Intravenous Every 12 hours 09/01/17 0100     09/01/17 0100  ceFEPIme (MAXIPIME) 1 g in sodium chloride 0.9 % 100 mL IVPB     1 g 200 mL/hr over 30 Minutes Intravenous  Once 09/01/17 0100 09/01/17 0325        Objective:   Vitals:   09/01/17 1558 09/01/17 2057 09/02/17 0007 09/02/17 0344  BP: (!) 140/94 (!) 146/100 (!) 153/86 137/88  Pulse: 76 93 80 85  Resp: 18 18 16 16   Temp: 98.1 F (36.7 C)  98.2 F (36.8 C)   TempSrc: Oral  Oral   SpO2: 97% 99% 98% 96%  Weight:      Height:        Wt Readings from Last 3 Encounters:  09/01/17 98.8 kg (217 lb 13 oz)  08/05/17 93 kg (205 lb)  07/25/17 93 kg (205 lb 0.1 oz)     Intake/Output Summary (Last 24 hours) at 09/02/2017 0703 Last data filed at 09/01/2017 2221 Gross per 24 hour  Intake -  Output 400 ml  Net -400 ml     Physical Exam  Awake Alert, Oriented X 3, No new F.N deficits, Normal affect College.AT,PERRAL Supple Neck,No JVD, No cervical lymphadenopathy appriciated.  Symmetrical Chest wall  movement, Good air movement bilaterally, CTAB RRR,No Gallops,Rubs or new Murmurs, No Parasternal Heave +ve B.Sounds, Abd Soft, No tenderness, No organomegaly appriciated, No rebound - guarding or rigidity. No Cyanosis, Clubbing or edema, No new Rash or bruise    Speech improved.  Very subtle left sided weakness   Data Review:    CBC Recent Labs  Lab  08/31/17 2300 08/31/17 2306 09/02/17 0621  WBC 6.2  --  6.1  HGB 13.1 15.0 12.2*  HCT 40.9 44.0 38.3*  PLT 183  --  172  MCV 73.4*  --  72.5*  MCH 23.5*  --  23.1*  MCHC 32.0  --  31.9  RDW 15.0  --  14.4  LYMPHSABS 3.0  --   --   MONOABS 0.6  --   --   EOSABS 0.2  --   --   BASOSABS 0.0  --   --     Chemistries  Recent Labs  Lab 08/31/17 2300 08/31/17 2306 09/02/17 0621  NA 138 138 138  K 4.7 4.7 4.4  CL 100* 101 103  CO2 27  --  24  GLUCOSE 115* 112* 94  BUN 22* 26* 15  CREATININE 1.06 1.10 0.96  CALCIUM 9.6  --  9.4  AST 26  --  22  ALT 21  --  20  ALKPHOS 61  --  44  BILITOT 0.5  --  0.6   ------------------------------------------------------------------------------------------------------------------ Recent Labs    09/01/17 0359  CHOL 211*  HDL 63  LDLCALC 139*  TRIG 45  CHOLHDL 3.3    Lab Results  Component Value Date   HGBA1C 5.9 (H) 09/01/2017   ------------------------------------------------------------------------------------------------------------------ No results for input(s): TSH, T4TOTAL, T3FREE, THYROIDAB in the last 72 hours.  Invalid input(s): FREET3 ------------------------------------------------------------------------------------------------------------------ No results for input(s): VITAMINB12, FOLATE, FERRITIN, TIBC, IRON, RETICCTPCT in the last 72 hours.  Coagulation profile Recent Labs  Lab 08/31/17 2300  INR 0.89    No results for input(s): DDIMER in the last 72 hours.  Cardiac Enzymes No results for input(s): CKMB, TROPONINI, MYOGLOBIN in the last 168 hours.  Invalid input(s): CK ------------------------------------------------------------------------------------------------------------------    Component Value Date/Time   BNP 9.4 07/25/2017 1745    Inpatient Medications  Scheduled Meds: . aspirin EC  81 mg Oral Daily  . atorvastatin  80 mg Oral q1800  . clopidogrel  75 mg Oral Daily  . enoxaparin  (LOVENOX) injection  40 mg Subcutaneous Q24H  . LORazepam  1 mg Intravenous Once  . tamsulosin  0.4 mg Oral Daily   Continuous Infusions: . ceFEPime (MAXIPIME) IV Stopped (09/02/17 0056)   PRN Meds:.acetaminophen **OR** acetaminophen (TYLENOL) oral liquid 160 mg/5 mL **OR** acetaminophen, senna-docusate  Micro Results No results found for this or any previous visit (from the past 240 hour(s)).  Radiology Reports Ct Angio Head W Or Wo Contrast  Result Date: 08/31/2017 CLINICAL DATA:  72 y/o  M; left-sided weakness and slurred speech. EXAM: CT ANGIOGRAPHY HEAD AND NECK CT PERFUSION BRAIN TECHNIQUE: Multidetector CT imaging of the head and neck was performed using the standard protocol during bolus administration of intravenous contrast. Multiplanar CT image reconstructions and MIPs were obtained to evaluate the vascular anatomy. Carotid stenosis measurements (when applicable) are obtained utilizing NASCET criteria, using the distal internal carotid diameter as the denominator. Multiphase CT imaging of the brain was performed following IV bolus contrast injection. Subsequent parametric perfusion maps were calculated using RAPID software. CONTRAST:  154mL ISOVUE-370 IOPAMIDOL (ISOVUE-370) INJECTION 76% COMPARISON:  08/31/2017  CT of the head. FINDINGS: CTA NECK FINDINGS Aortic arch: Left vertebral artery arises from the aortic arch. Imaged portion shows no evidence of aneurysm or dissection. No significant stenosis of the major arch vessel origins. Mild fibrofatty plaque. 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 No high-grade stenosis or dissection. Left carotid system: No evidence of dissection, stenosis (50% or greater) or occlusion. Vertebral arteries: Right dominant. No evidence of dissection, stenosis (50% or greater) or occlusion. Skeleton: Mild multilevel cervical spondylosis. No  high-grade bony canal stenosis. Other neck: Negative. Upper chest: Negative. Review of the MIP images confirms the above findings CTA HEAD FINDINGS Anterior circulation: No significant stenosis, proximal occlusion, aneurysm, or vascular malformation. Right cavernous ICA fibrofatty plaque with mild stenosis. Posterior circulation: No significant stenosis, proximal occlusion, aneurysm, or vascular malformation. Venous sinuses: As permitted by contrast timing, patent. Anatomic variants: Bilateral posterior communicating arteries. No anterior communicating artery, likely hypoplastic or absent. Delayed phase: No abnormal intracranial enhancement. Review of the MIP images confirms the above findings CT Brain Perfusion Findings: CBF (<30%) Volume: 3mL Perfusion (Tmax>6.0s) volume: 15mL Mismatch Volume: 7mL Infarction Location:Negative. IMPRESSION: 1. Patent carotid and vertebral arteries. No dissection or hemodynamically significant stenosis utilizing NASCET criteria. 2. Patent anterior and posterior intracranial circulation. No large vessel occlusion, aneurysm, or significant stenosis. 3. 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 4. Negative CT brain perfusion. These results were called by telephone at the time of interpretation on 08/31/2017 at 11:49 pm to Dr. Amie Portland , who verbally acknowledged these results. Electronically Signed   By: Kristine Garbe M.D.   On: 08/31/2017 23:49   Ct Angio Neck W Or Wo Contrast  Result Date: 08/31/2017 CLINICAL DATA:  72 y/o  M; left-sided weakness and slurred speech. EXAM: CT ANGIOGRAPHY HEAD AND NECK CT PERFUSION BRAIN TECHNIQUE: Multidetector CT imaging of the head and neck was performed using the standard protocol during bolus administration of intravenous contrast. Multiplanar CT image reconstructions and MIPs were obtained to evaluate the vascular anatomy. Carotid stenosis measurements (when applicable) are obtained utilizing NASCET criteria, using the  distal internal carotid diameter as the denominator. Multiphase CT imaging of the brain was performed following IV bolus contrast injection. Subsequent parametric perfusion maps were calculated using RAPID software. CONTRAST:  180mL ISOVUE-370 IOPAMIDOL (ISOVUE-370) INJECTION 76% COMPARISON:  08/31/2017 CT of the head. FINDINGS: CTA NECK FINDINGS Aortic arch: Left vertebral artery arises from the aortic arch. Imaged portion shows no evidence of aneurysm or dissection. No significant stenosis of the major arch vessel origins. Mild fibrofatty plaque. 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 No high-grade stenosis or dissection. Left carotid system: No evidence of dissection, stenosis (50% or greater) or occlusion. Vertebral arteries: Right dominant. No evidence of dissection, stenosis (50% or greater) or occlusion. Skeleton: Mild multilevel cervical spondylosis. No high-grade bony canal stenosis. Other neck: Negative. Upper chest: Negative. Review of the MIP images confirms the above findings CTA HEAD FINDINGS Anterior circulation: No significant stenosis, proximal occlusion, aneurysm, or vascular malformation. Right cavernous ICA fibrofatty plaque with mild stenosis. Posterior circulation: No significant stenosis, proximal occlusion, aneurysm, or vascular malformation. Venous sinuses: As permitted by contrast timing, patent. Anatomic variants: Bilateral posterior communicating arteries. No anterior communicating  artery, likely hypoplastic or absent. Delayed phase: No abnormal intracranial enhancement. Review of the MIP images confirms the above findings CT Brain Perfusion Findings: CBF (<30%) Volume: 50mL Perfusion (Tmax>6.0s) volume: 26mL Mismatch Volume: 91mL Infarction Location:Negative. IMPRESSION: 1. Patent carotid and vertebral arteries. No dissection or hemodynamically significant stenosis  utilizing NASCET criteria. 2. Patent anterior and posterior intracranial circulation. No large vessel occlusion, aneurysm, or significant stenosis. 3. 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 4. Negative CT brain perfusion. These results were called by telephone at the time of interpretation on 08/31/2017 at 11:49 pm to Dr. Amie Portland , who verbally acknowledged these results. Electronically Signed   By: Kristine Garbe M.D.   On: 08/31/2017 23:49   Mr Brain Wo Contrast  Result Date: 09/01/2017 CLINICAL DATA:  72 y/o  M; left-sided weakness and slurred speech. EXAM: MRI HEAD WITHOUT CONTRAST TECHNIQUE: Multiplanar, multiecho pulse sequences of the brain and surrounding structures were obtained without intravenous contrast. COMPARISON:  CT head, CT perfusion head, and CTA head dated 08/31/2017. 08/05/2017 MRI head. FINDINGS: Brain: 13 mm focus of reduced diffusion in right putamen extending into posterior corona radiata and subcentimeter focus of reduced diffusion in the right caudate body compatible with acute/early subacute infarction (series 3, image 29-33). No associated hemorrhage or mass effect. Stable scatterednonspecific foci of T2 FLAIR hyperintense signal abnormality in subcortical and periventricular white matter are compatible withmild to moderatechronic microvascular ischemic changes for age. Moderatebrain parenchymal volume loss. Stable small chronic lacunar infarcts in the right pons, bilateral basal ganglia, left splenium of corpus callosum, and left external capsule/corona radiata. Numerous foci of susceptibility hypointensity compatible with hemosiderin deposition of chronic microhemorrhage present throughout the cerebellum, brainstem, left medial temporal lobe, and paramedian frontal parietal lobes. The distribution is bilateral ACA and PCA. Vascular: Normal flow voids. Skull and upper cervical spine: Normal marrow signal. Sinuses/Orbits: Mild diffuse paranasal sinus  mucosal thickening. No significant abnormal signal of mastoid air cells. Orbits are unremarkable. Other: None. IMPRESSION: 1. Small foci of acute/early subacute infarction within the right putamen extending into corona radiata and right caudate body. No associated acute hemorrhage or mass effect. 2. Stable background of chronic microvascular ischemic changes, parenchymal volume loss, and chronic lacunar infarcts. 3. Stable numerous foci of chronic microhemorrhage throughout ACA and PCA distributions. Findings may be related to chronic hypertension or amyloid angiopathy. These results were called by telephone at the time of interpretation on 09/01/2017 at 1:34 am to Dr. Delora Fuel , who verbally acknowledged these results. Electronically Signed   By: Kristine Garbe M.D.   On: 09/01/2017 01:37   Mr Brain Wo Contrast (neuro Protocol)  Result Date: 08/05/2017 CLINICAL DATA:  Dizziness. Ataxia. History of prostate and renal cancer. EXAM: MRI HEAD WITHOUT CONTRAST TECHNIQUE: Multiplanar, multiecho pulse sequences of the brain and surrounding structures were obtained without intravenous contrast. COMPARISON:  Head CT 07/25/2017 FINDINGS: Brain: There is no evidence of acute infarct, mass, midline shift, or extra-axial fluid collection. There is mild cerebral atrophy. There are innumerable chronic microhemorrhages in the cerebellum and brainstem as well as parasagittal frontal and parietal lobes. A few additional chronic microhemorrhages are noted in the temporal and occipital lobes and left external capsule/posterior lentiform nucleus region. T2 hyperintensities in the periventricular greater than subcortical cerebral white matter and pons are nonspecific but compatible with mild-to-moderate chronic small vessel ischemic disease. Chronic lacunar infarcts are present in the right pons, bilateral basal ganglia, left external capsule/corona radiata, and left splenium of the corpus  callosum/periatrial white  matter. There is moderate cerebral atrophy. Vascular: Major intracranial vascular flow voids are preserved. Skull and upper cervical spine: Unremarkable bone marrow signal. Sinuses/Orbits: Unremarkable orbits. Minimal scattered paranasal sinus mucosal thickening. Clear mastoid air cells. Other: None. IMPRESSION: 1. No acute intracranial abnormality. 2. Numerous chronic microhemorrhages in the cerebellum, brainstem, and cerebral hemispheres which could reflect the sequelae of chronic hypertension or cerebral amyloid angiopathy. 3. 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 Electronically Signed   By: Logan Bores M.D.   On: 08/05/2017 13:29   Ct Cerebral Perfusion W Contrast  Result Date: 08/31/2017 CLINICAL DATA:  72 y/o  M; left-sided weakness and slurred speech. EXAM: CT ANGIOGRAPHY HEAD AND NECK CT PERFUSION BRAIN TECHNIQUE: Multidetector CT imaging of the head and neck was performed using the standard protocol during bolus administration of intravenous contrast. Multiplanar CT image reconstructions and MIPs were obtained to evaluate the vascular anatomy. Carotid stenosis measurements (when applicable) are obtained utilizing NASCET criteria, using the distal internal carotid diameter as the denominator. Multiphase CT imaging of the brain was performed following IV bolus contrast injection. Subsequent parametric perfusion maps were calculated using RAPID software. CONTRAST:  161mL ISOVUE-370 IOPAMIDOL (ISOVUE-370) INJECTION 76% COMPARISON:  08/31/2017 CT of the head. FINDINGS: CTA NECK FINDINGS Aortic arch: Left vertebral artery arises from the aortic arch. Imaged portion shows no evidence of aneurysm or dissection. No significant stenosis of the major arch vessel origins. Mild fibrofatty plaque. 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 No high-grade stenosis  or dissection. Left carotid system: No evidence of dissection, stenosis (50% or greater) or occlusion. Vertebral arteries: Right dominant. No evidence of dissection, stenosis (50% or greater) or occlusion. Skeleton: Mild multilevel cervical spondylosis. No high-grade bony canal stenosis. Other neck: Negative. Upper chest: Negative. Review of the MIP images confirms the above findings CTA HEAD FINDINGS Anterior circulation: No significant stenosis, proximal occlusion, aneurysm, or vascular malformation. Right cavernous ICA fibrofatty plaque with mild stenosis. Posterior circulation: No significant stenosis, proximal occlusion, aneurysm, or vascular malformation. Venous sinuses: As permitted by contrast timing, patent. Anatomic variants: Bilateral posterior communicating arteries. No anterior communicating artery, likely hypoplastic or absent. Delayed phase: No abnormal intracranial enhancement. Review of the MIP images confirms the above findings CT Brain Perfusion Findings: CBF (<30%) Volume: 50mL Perfusion (Tmax>6.0s) volume: 62mL Mismatch Volume: 73mL Infarction Location:Negative. IMPRESSION: 1. Patent carotid and vertebral arteries. No dissection or hemodynamically significant stenosis utilizing NASCET criteria. 2. Patent anterior and posterior intracranial circulation. No large vessel occlusion, aneurysm, or significant stenosis. 3. 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 4. Negative CT brain perfusion. These results were called by telephone at the time of interpretation on 08/31/2017 at 11:49 pm to Dr. Amie Portland , who verbally acknowledged these results. Electronically Signed   By: Kristine Garbe M.D.   On: 08/31/2017 23:49   Ct Head Code Stroke Wo Contrast  Result Date: 08/31/2017 CLINICAL DATA:  Code stroke. 73 y/o M; left-sided weakness and slurred speech. EXAM: CT HEAD WITHOUT CONTRAST TECHNIQUE: Contiguous axial images were obtained from the base of the skull through the vertex  without intravenous contrast. COMPARISON:  08/05/2017 MRI head.  07/25/2017 CT head. FINDINGS: Brain: No evidence of acute infarction, hemorrhage, hydrocephalus, extra-axial collection or mass lesion/mass effect. Stable chronic microvascular ischemic changes and parenchymal volume loss of the  brain given differences in technique. Vascular: No hyperdense vessel or unexpected calcification. Skull: Normal. Negative for fracture or focal lesion. Sinuses/Orbits: No acute finding. Other: None. ASPECTS [facility] Stroke Program Early CT Score) - Ganglionic level infarction (caudate, lentiform nuclei, internal capsule, insula, M1-M3 cortex): 7 - Supraganglionic infarction (M4-M6 cortex): 3 Total score (0-10 with 10 being normal): 10 IMPRESSION: 1. No acute intracranial abnormality identified. 2. Stable chronic microvascular ischemic changes and parenchymal volume loss of the brain given differences in technique. 3. ASPECTS is 10 These results were communicated to Dr. Rory Percy at 11:20 pmon 4/5/2019by text page via the [facility] messaging system. Electronically Signed   By: Kristine Garbe M.D.   On: 08/31/2017 23:20    Time Spent in minutes  30   Jani Gravel M.D on 09/02/2017 at 7:03 AM  Between 7am to 7pm - Pager - [phone]    After 7pm go to [email or web address] - password [facility]  [facility] -  Office  [phone]

## 2017-09-02 NOTE — Progress Notes (Signed)
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  Has UTI, L side weakness, metastatic prostate CA, renal CA, HTN    PT Comments    Pt progressing well towards functional mobility goals. Pt supervision for transfers. He was able to ambulate without an AD min guard this session. He demonstrates instability during ambulation with higher level balance tasks and multitasking. DGI <19 (17/24) indicating pt is at an increased risk for falls. D/c recommendation updated to home with HHPT and supervision for mobility/OOB. May need to consider SNF placement if unable to secure supervision. Will continue to follow acutely and progress as tolerated.    Follow Up Recommendations  Home health PT;Supervision for mobility/OOB     Equipment Recommendations  None recommended by PT    Recommendations for Other Services       Precautions / Restrictions Precautions Precautions: Fall Restrictions Weight Bearing Restrictions: No    Mobility  Bed Mobility Overal bed mobility: Needs Assistance Bed Mobility: Supine to Sit     Supine to sit: Supervision     General bed mobility comments: pt in recliner chair upon PT arrival and returned to chair at end of session  Transfers Overall transfer level: Needs assistance Equipment used: None Transfers: Sit to/from Stand Sit to Stand: Supervision       General transfer comment: supervision for safety  Ambulation/Gait Ambulation/Gait assistance: Min guard Ambulation Distance (Feet): 120 Feet(120x2 with standing rest break between) Assistive device: None Gait Pattern/deviations: Step-through pattern;Decreased stride length;Wide base of support Gait velocity: decreased Gait velocity interpretation: Below normal speed for age/gender General Gait Details: slightly wide BOS. pt  with mild instability but no LOB. decreased speed noted with multitasking (responding to questions). VCs for increased speed   Stairs Stairs: Yes   Stair Management: Two rails;Alternating pattern;Forwards Number of Stairs: 3 General stair comments: min guard for balance and safety  Wheelchair Mobility    Modified Rankin (Stroke Patients Only) Modified Rankin (Stroke Patients Only) Pre-Morbid Rankin Score: Slight disability Modified Rankin: Moderately severe disability     Balance Overall balance assessment: Needs assistance Sitting-balance support: Feet supported Sitting balance-Leahy Scale: Good     Standing balance support: No upper extremity supported;During functional activity Standing balance-Leahy Scale: Good Standing balance comment: min guard for safety and instability                 Standardized Balance Assessment Standardized Balance Assessment : Dynamic Gait Index   Dynamic Gait Index Level Surface: Mild Impairment Change in Gait Speed: Normal Gait with Horizontal Head Turns: Mild Impairment Gait with Vertical Head Turns: Mild Impairment Gait and Pivot Turn: Mild Impairment Step Over Obstacle: Mild Impairment Step Around Obstacles: Mild Impairment Steps: Mild Impairment Total Score: 17      Cognition Arousal/Alertness: Awake/alert Behavior During Therapy: WFL for tasks assessed/performed Overall Cognitive Status: Within Functional Limits for tasks assessed                                        Exercises      General Comments General comments (skin integrity, edema, etc.): VSS      Pertinent Vitals/Pain Pain Assessment: No/denies pain    Home Living  Prior Function            PT Goals (current goals can now be found in the care plan section) Acute Rehab PT Goals Patient Stated Goal: regain/maintain independence PT Goal Formulation: With patient Time For Goal Achievement:  09/15/17 Potential to Achieve Goals: Good Progress towards PT goals: Progressing toward goals    Frequency    Min 3X/week      PT Plan Discharge plan needs to be updated;Frequency needs to be updated    Co-evaluation              AM-PAC PT "6 Clicks" Daily Activity  Outcome Measure  Difficulty turning over in bed (including adjusting bedclothes, sheets and blankets)?: None Difficulty moving from lying on back to sitting on the side of the bed? : A Little Difficulty sitting down on and standing up from a chair with arms (e.g., wheelchair, bedside commode, etc,.)?: None Help needed moving to and from a bed to chair (including a wheelchair)?: A Little Help needed walking in hospital room?: A Little Help needed climbing 3-5 steps with a railing? : A Little 6 Click Score: 20    End of Session Equipment Utilized During Treatment: Gait belt Activity Tolerance: Patient tolerated treatment well Patient left: in chair;with call bell/phone within reach Nurse Communication: Mobility status PT Visit Diagnosis: Unsteadiness on feet (R26.81);Muscle weakness (generalized) (M62.81);Difficulty in walking, not elsewhere classified (R26.2);Hemiplegia and hemiparesis     Time: 8638-1771 PT Time Calculation (min) (ACUTE ONLY): 19 min  Charges:  $Gait Training: 8-22 mins                    G Codes:       Vic Ripper, SPT   Vic Ripper 09/02/2017, 1:17 PM

## 2017-09-02 NOTE — Care Management Obs Status (Signed)
[facility] NOTIFICATION   Patient Details  Name: Maude Gloor MRN: [id number] Date of Birth: 25-Sep-1945   Medicare Observation Status Notification Given:  Yes    Claudie Leach, RN 09/02/2017, 3:15 PM

## 2017-09-03 ENCOUNTER — Inpatient Hospital Stay (HOSPITAL_COMMUNITY): Payer: Medicare Other

## 2017-09-03 DIAGNOSIS — I5030 Unspecified diastolic (congestive) heart failure: Secondary | ICD-10-CM

## 2017-09-03 LAB — CBC
HCT: 39.3 % (ref 39.0–52.0)
Hemoglobin: 12.5 g/dL — ABNORMAL LOW (ref 13.0–17.0)
MCH: 22.8 pg — ABNORMAL LOW (ref 26.0–34.0)
MCHC: 31.8 g/dL (ref 30.0–36.0)
MCV: 71.7 fL — ABNORMAL LOW (ref 78.0–100.0)
PLATELETS: 179 10*3/uL (ref 150–400)
RBC: 5.48 MIL/uL (ref 4.22–5.81)
RDW: 14.1 % (ref 11.5–15.5)
WBC: 5.3 10*3/uL (ref 4.0–10.5)

## 2017-09-03 LAB — COMPREHENSIVE METABOLIC PANEL
ALBUMIN: 3.7 g/dL (ref 3.5–5.0)
ALT: 19 U/L (ref 17–63)
ANION GAP: 10 (ref 5–15)
AST: 19 U/L (ref 15–41)
Alkaline Phosphatase: 48 U/L (ref 38–126)
BUN: 17 mg/dL (ref 6–20)
CHLORIDE: 100 mmol/L — AB (ref 101–111)
CO2: 26 mmol/L (ref 22–32)
Calcium: 9.4 mg/dL (ref 8.9–10.3)
Creatinine, Ser: 0.97 mg/dL (ref 0.61–1.24)
GFR calc Af Amer: >60 mL/min (ref >60)
GFR calc non Af Amer: >60 mL/min (ref >60)
GLUCOSE: 97 mg/dL (ref 65–99)
Potassium: 4.6 mmol/L (ref 3.5–5.1)
SODIUM: 136 mmol/L (ref 135–145)
TOTAL PROTEIN: 7 g/dL (ref 6.5–8.1)
Total Bilirubin: 1 mg/dL (ref 0.3–1.2)

## 2017-09-03 LAB — FERRITIN: Ferritin: 83 ng/mL (ref 24–336)

## 2017-09-03 LAB — ECHOCARDIOGRAM COMPLETE
Height: 72 in
WEIGHTICAEL: 3485.0317 [oz_av]

## 2017-09-03 LAB — IRON AND TIBC
Iron: 59 ug/dL (ref 45–182)
SATURATION RATIOS: 21 % (ref 17.9–39.5)
TIBC: 279 ug/dL (ref 250–450)
UIBC: 220 ug/dL

## 2017-09-03 MED ORDER — CEPHALEXIN 500 MG PO CAPS: 500.0000 mg | ORAL_CAPSULE | Freq: Two times a day (BID) | ORAL | 0 refills | Status: DC

## 2017-09-03 MED ORDER — CLOPIDOGREL BISULFATE 75 MG PO TABS: 75.0000 mg | ORAL_TABLET | Freq: Every day | ORAL | 0 refills | Status: DC

## 2017-09-03 MED ORDER — ATORVASTATIN CALCIUM 80 MG PO TABS: 80.0000 mg | ORAL_TABLET | Freq: Every day | ORAL | 0 refills | Status: DC

## 2017-09-03 MED ORDER — CEPHALEXIN 500 MG PO CAPS
500.0000 mg | ORAL_CAPSULE | Freq: Two times a day (BID) | ORAL | Status: DC
Start: 2017-09-03 — End: 2017-09-03

## 2017-09-03 NOTE — Progress Notes (Signed)
Pt being discharged from hospital per orders from MD. Pt educated on discharge instructions. Pt verbalized understanding of instructions. All questions and concerns were addressed. Pt's IV was removed prior to discharge. Pt exited hospital via wheelchair accompanied by staff. 

## 2017-09-03 NOTE — Progress Notes (Signed)
Patient ID: John Shields, male   DOB: Jun 20, 1945, 72 y.o.   MRN: [id number]                                                                PROGRESS NOTE                                                                                                                                                                                                             Patient Demographics:    John Shields, is a 72 y.o. male, DOB - [birth year], WUJ:[id number]  Admit date - 08/31/2017   Admitting Physician Vianne Bulls, MD  Outpatient Primary MD for the patient is [facility], [facility]  LOS - 1  Outpatient Specialists:     Chief Complaint  Patient presents with  . Code Stroke       Brief Narrative   72 y.o.malewith medical history significant formetastatic prostate cancer status post bilateral orchiectomy, right renal cell carcinoma,andhypertension,now presenting to the emergency department for evaluation of weakness and slurred speech. Patient reports that he was in his usual state of health when he sat down on his couch at approximately 8:45 PM, but when he tried to stand at approximately 9:15 PM, he was having difficulty that he attributed to the left-sided weakness and numbness. He also reports difficulty with speech, noting that he was slurring his words. There was no headache, change in vision or hearing, chest pain, orpalpitations.Reports that symptoms are improving, but not resolved. He denies history of stroke per se, but reports that there was evidence of such on a prior MRI.  ED Course:Upon arrival to the ED, patient is found to be afebrile, saturating well on room air, and with vitals otherwise normal. EKG features a sinus rhythm and chronic left bundle branch block. Noncontrast head CT is negative for acute intracranial abnormality. Chemistry panel is unremarkable, CBC is notable for microcytosis without anemia, INR is normal, and troponin is undetectable. Urinalysis is  suggestive of infection. Neurology was consulted by the ED physician and recommends a medical admission for ongoing evaluation and management of possible CVA/TIA.      Subjective:    John Shields today is doing better overall.  speech improving.  OT seen by pt recommendeed 3:1 bedside commode. Echo still pending. Urine culture pending.  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 Estanislado Pandy  3.UTI -UA is suggestive of infection, possibly colonization of Foley - Reports Foley changed monthly by [facility] RN -Possible that the acute neurologic sxs reflect recrudescence in old stroke sxs d/t acute UTI, so favor empiric treatment -Culture urine(pending) -Contcefepime  4.Prostate cancer with metastases; right renal mass -Status-post bilateral orchiectomy -Catheter-dependent -Right renal mass suspected  to be a primary RCC  5.Hypertension -Hold lisinopril and Toprol initially while evaluating for possible acute ischemic CVA           Code Status : FUL L  CODE  Family Communication  : w patient  Disposition Plan  : home with home health  Barriers For Discharge : no echo yet  Consults  :  neurology  Procedures  :  MRI brain 4/6 DVT Prophylaxis  :  Lovenox - SCDs   Lab Results  Component Value Date   PLT 179 09/03/2017    Antibiotics  :  Cefepime 4/6=>  Anti-infectives (From admission, onward)   Start     Dose/Rate Route Frequency Ordered Stop   09/01/17 1200  ceFEPIme (MAXIPIME) 1 g in sodium chloride 0.9 % 100 mL IVPB     1 g 200 mL/hr over 30 Minutes Intravenous Every 12 hours 09/01/17 0100     09/01/17 0100  ceFEPIme (MAXIPIME) 1 g in sodium chloride 0.9 % 100 mL IVPB     1 g 200 mL/hr over 30 Minutes Intravenous  Once 09/01/17 0100 09/01/17 0325        Objective:   Vitals:   09/02/17 0734 09/02/17 2020 09/03/17 0022 09/03/17 0408  BP: (!) 157/85 135/88 (!) 153/91 (!) 153/89  Pulse: 79 77 78 81  Resp: 18 18 16 16   Temp: 97.7 F (36.5 C) 98.2 F (36.8 C) 98.2 F (36.8 C) 98.2 F (36.8 C)  TempSrc: Oral Oral Oral Oral  SpO2: 96% 98% 98% 96%  Weight:      Height:        Wt Readings from Last 3 Encounters:  09/01/17 98.8 kg (217 lb 13 oz)  08/05/17 93 kg (205 lb)  07/25/17 93 kg (205 lb 0.1 oz)     Intake/Output Summary (Last 24 hours) at 09/03/2017 0702 Last data filed at 09/02/2017 2353 Gross per 24 hour  Intake 100 ml  Output 425 ml  Net -325 ml     Physical Exam  Awake Alert, Oriented X 3, No new  F.N deficits, Normal affect [place].AT,PERRAL Supple Neck,No JVD, No cervical lymphadenopathy appriciated.  Symmetrical Chest wall movement, Good air movement bilaterally, CTAB RRR,No Gallops,Rubs or new Murmurs, No Parasternal Heave +ve B.Sounds, Abd Soft, No tenderness, No organomegaly appriciated, No rebound - guarding or  rigidity. No Cyanosis, Clubbing or edema, No new Rash or bruise    Slurred speech improved,  Very subtle left sided weakness     Data Review:    CBC Recent Labs  Lab 08/31/17 2300 08/31/17 2306 09/02/17 0621 09/03/17 0141  WBC 6.2  --  6.1 5.3  HGB 13.1 15.0 12.2* 12.5*  HCT 40.9 44.0 38.3* 39.3  PLT 183  --  172 179  MCV 73.4*  --  72.5* 71.7*  MCH 23.5*  --  23.1* 22.8*  MCHC 32.0  --  31.9 31.8  RDW 15.0  --  14.4 14.1  LYMPHSABS 3.0  --   --   --   MONOABS 0.6  --   --   --   EOSABS 0.2  --   --   --   BASOSABS 0.0  --   --   --     Chemistries  Recent Labs  Lab 08/31/17 2300 08/31/17 2306 09/02/17 0621 09/03/17 0141  NA 138 138 138 136  K 4.7 4.7 4.4 4.6  CL 100* 101 103 100*  CO2 27  --  24 26  GLUCOSE 115* 112* 94 97  BUN 22* 26* 15 17  CREATININE 1.06 1.10 0.96 0.97  CALCIUM 9.6  --  9.4 9.4  AST 26  --  22 19  ALT 21  --  20 19  ALKPHOS 61  --  44 48  BILITOT 0.5  --  0.6 1.0   ------------------------------------------------------------------------------------------------------------------ Recent Labs    09/01/17 0359  CHOL 211*  HDL 63  LDLCALC 139*  TRIG 45  CHOLHDL 3.3    Lab Results  Component Value Date   HGBA1C 5.9 (H) 09/01/2017   ------------------------------------------------------------------------------------------------------------------ No results for input(s): TSH, T4TOTAL, T3FREE, THYROIDAB in the last 72 hours.  Invalid input(s): FREET3 ------------------------------------------------------------------------------------------------------------------ Recent Labs    09/03/17 0141  FERRITIN 83  TIBC 279  IRON 59    Coagulation profile Recent Labs  Lab 08/31/17 2300  INR 0.89    No results for input(s): DDIMER in the last 72 hours.  Cardiac Enzymes No results for input(s): CKMB, TROPONINI, MYOGLOBIN in the last 168 hours.  Invalid input(s):  CK ------------------------------------------------------------------------------------------------------------------    Component Value Date/Time   BNP 9.4 07/25/2017 1745    Inpatient Medications  Scheduled Meds: . aspirin EC  81 mg Oral Daily  . atorvastatin  80 mg Oral q1800  . clopidogrel  75 mg Oral Daily  . enoxaparin (LOVENOX) injection  40 mg Subcutaneous Q24H  . LORazepam  1 mg Intravenous Once  . tamsulosin  0.4 mg Oral Daily   Continuous Infusions: . ceFEPime (MAXIPIME) IV Stopped (09/02/17 2353)   PRN Meds:.acetaminophen **OR** acetaminophen (TYLENOL) oral liquid 160 mg/5 mL **OR** acetaminophen, senna-docusate  Micro Results Recent Results (from the past 240 hour(s))  Culture, Urine     Status: Abnormal (Preliminary result)   Collection Time: 09/01/17 12:40 AM  Result Value Ref Range Status   Specimen Description URINE, RANDOM  Final   Special Requests NONE  Final   Culture (A)  Final    >=100,000 COLONIES/mL ESCHERICHIA COLI SUSCEPTIBILITIES TO FOLLOW Performed at [facility], [address] [place], [place] [address]  Report Status PENDING  Incomplete    Radiology Reports Ct Angio Head W Or Wo Contrast  Result Date: 08/31/2017 CLINICAL DATA:  72 y/o  M; left-sided weakness and slurred speech. EXAM: CT ANGIOGRAPHY HEAD AND NECK CT PERFUSION BRAIN TECHNIQUE: Multidetector CT imaging of the head and neck was performed using the standard protocol during bolus administration of intravenous contrast. Multiplanar CT image reconstructions and MIPs were obtained to evaluate the vascular anatomy. Carotid stenosis measurements (when applicable) are obtained utilizing NASCET criteria, using the distal internal carotid diameter as the denominator. Multiphase CT imaging of the brain was performed following IV bolus contrast injection. Subsequent parametric perfusion maps were calculated using RAPID software. CONTRAST:  137mL ISOVUE-370 IOPAMIDOL (ISOVUE-370)  INJECTION 76% COMPARISON:  08/31/2017 CT of the head. FINDINGS: CTA NECK FINDINGS Aortic arch: Left vertebral artery arises from the aortic arch. Imaged portion shows no evidence of aneurysm or dissection. No significant stenosis of the major arch vessel origins. Mild fibrofatty plaque. 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 No high-grade stenosis or dissection. Left carotid system: No evidence of dissection, stenosis (50% or greater) or occlusion. Vertebral arteries: Right dominant. No evidence of dissection, stenosis (50% or greater) or occlusion. Skeleton: Mild multilevel cervical spondylosis. No high-grade bony canal stenosis. Other neck: Negative. Upper chest: Negative. Review of the MIP images confirms the above findings CTA HEAD FINDINGS Anterior circulation: No significant stenosis, proximal occlusion, aneurysm, or vascular malformation. Right cavernous ICA fibrofatty plaque with mild stenosis. Posterior circulation: No significant stenosis, proximal occlusion, aneurysm, or vascular malformation. Venous sinuses: As permitted by contrast timing, patent. Anatomic variants: Bilateral posterior communicating arteries. No anterior communicating artery, likely hypoplastic or absent. Delayed phase: No abnormal intracranial enhancement. Review of the MIP images confirms the above findings CT Brain Perfusion Findings: CBF (<30%) Volume: 71mL Perfusion (Tmax>6.0s) volume: 62mL Mismatch Volume: 39mL Infarction Location:Negative. IMPRESSION: 1. Patent carotid and vertebral arteries. No dissection or hemodynamically significant stenosis utilizing NASCET criteria. 2. Patent anterior and posterior intracranial circulation. No large vessel occlusion, aneurysm, or significant stenosis. 3. 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 4. Negative CT brain perfusion. These results were called by  telephone at the time of interpretation on 08/31/2017 at 11:49 pm to Dr. Amie Portland , who verbally acknowledged these results. Electronically Signed   By: Kristine Garbe M.D.   On: 08/31/2017 23:49   Ct Angio Neck W Or Wo Contrast  Result Date: 08/31/2017 CLINICAL DATA:  72 y/o  M; left-sided weakness and slurred speech. EXAM: CT ANGIOGRAPHY HEAD AND NECK CT PERFUSION BRAIN TECHNIQUE: Multidetector CT imaging of the head and neck was performed using the standard protocol during bolus administration of intravenous contrast. Multiplanar CT image reconstructions and MIPs were obtained to evaluate the vascular anatomy. Carotid stenosis measurements (when applicable) are obtained utilizing NASCET criteria, using the distal internal carotid diameter as the denominator. Multiphase CT imaging of the brain was performed following IV bolus contrast injection. Subsequent parametric perfusion maps were calculated using RAPID software. CONTRAST:  175mL ISOVUE-370 IOPAMIDOL (ISOVUE-370) INJECTION 76% COMPARISON:  08/31/2017 CT of the head. FINDINGS: CTA NECK FINDINGS Aortic arch: Left vertebral artery arises from the aortic arch. Imaged portion shows no evidence of aneurysm or dissection. No significant stenosis of the major arch vessel origins. Mild fibrofatty plaque. Right carotid system: Right mid ICA superiorly directed saccular aneurysm  measuring 9 x 7 x 12 mm (AP x ML x CC series 11, image 141 and series 12, image 78). No high-grade stenosis or dissection. Left carotid system: No evidence of dissection, stenosis (50% or greater) or occlusion. Vertebral arteries: Right dominant. No evidence of dissection, stenosis (50% or greater) or occlusion. Skeleton: Mild multilevel cervical spondylosis. No high-grade bony canal stenosis. Other neck: Negative. Upper chest: Negative. Review of the MIP images confirms the above findings CTA HEAD FINDINGS Anterior circulation: No significant stenosis, proximal occlusion,  aneurysm, or vascular malformation. Right cavernous ICA fibrofatty plaque with mild stenosis. Posterior circulation: No significant stenosis, proximal occlusion, aneurysm, or vascular malformation. Venous sinuses: As permitted by contrast timing, patent. Anatomic variants: Bilateral posterior communicating arteries. No anterior communicating artery, likely hypoplastic or absent. Delayed phase: No abnormal intracranial enhancement. Review of the MIP images confirms the above findings CT Brain Perfusion Findings: CBF (<30%) Volume: 5mL Perfusion (Tmax>6.0s) volume: 47mL Mismatch Volume: 42mL Infarction Location:Negative. IMPRESSION: 1. Patent carotid and vertebral arteries. No dissection or hemodynamically significant stenosis utilizing NASCET criteria. 2. Patent anterior and posterior intracranial circulation. No large vessel occlusion, aneurysm, or significant stenosis. 3. 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 4. Negative CT brain perfusion. These results were called by telephone at the time of interpretation on 08/31/2017 at 11:49 pm to Dr. Amie Portland , who verbally acknowledged these results. Electronically Signed   By: Kristine Garbe M.D.   On: 08/31/2017 23:49   Mr Brain Wo Contrast  Result Date: 09/01/2017 CLINICAL DATA:  72 y/o  M; left-sided weakness and slurred speech. EXAM: MRI HEAD WITHOUT CONTRAST TECHNIQUE: Multiplanar, multiecho pulse sequences of the brain and surrounding structures were obtained without intravenous contrast. COMPARISON:  CT head, CT perfusion head, and CTA head dated 08/31/2017. 08/05/2017 MRI head. FINDINGS: Brain: 13 mm focus of reduced diffusion in right putamen extending into posterior corona radiata and subcentimeter focus of reduced diffusion in the right caudate body compatible with acute/early subacute infarction (series 3, image 29-33). No associated hemorrhage or mass effect. Stable scatterednonspecific foci of T2 FLAIR hyperintense signal  abnormality in subcortical and periventricular white matter are compatible withmild to moderatechronic microvascular ischemic changes for age. Moderatebrain parenchymal volume loss. Stable small chronic lacunar infarcts in the right pons, bilateral basal ganglia, left splenium of corpus callosum, and left external capsule/corona radiata. Numerous foci of susceptibility hypointensity compatible with hemosiderin deposition of chronic microhemorrhage present throughout the cerebellum, brainstem, left medial temporal lobe, and paramedian frontal parietal lobes. The distribution is bilateral ACA and PCA. Vascular: Normal flow voids. Skull and upper cervical spine: Normal marrow signal. Sinuses/Orbits: Mild diffuse paranasal sinus mucosal thickening. No significant abnormal signal of mastoid air cells. Orbits are unremarkable. Other: None. IMPRESSION: 1. Small foci of acute/early subacute infarction within the right putamen extending into corona radiata and right caudate body. No associated acute hemorrhage or mass effect. 2. Stable background of chronic microvascular ischemic changes, parenchymal volume loss, and chronic lacunar infarcts. 3. Stable numerous foci of chronic microhemorrhage throughout ACA and PCA distributions. Findings may be related to chronic hypertension or amyloid angiopathy. These results were called by telephone at the time of interpretation on 09/01/2017 at 1:34 am to Dr. Delora Fuel , who verbally acknowledged these results. Electronically Signed   By: Kristine Garbe M.D.   On: 09/01/2017 01:37   Mr Brain Wo Contrast (neuro Protocol)  Result Date: 08/05/2017 CLINICAL DATA:  Dizziness. Ataxia. History of prostate and renal cancer. EXAM: MRI HEAD  WITHOUT CONTRAST TECHNIQUE: Multiplanar, multiecho pulse sequences of the brain and surrounding structures were obtained without intravenous contrast. COMPARISON:  Head CT 07/25/2017 FINDINGS: Brain: There is no evidence of acute infarct, mass,  midline shift, or extra-axial fluid collection. There is mild cerebral atrophy. There are innumerable chronic microhemorrhages in the cerebellum and brainstem as well as parasagittal frontal and parietal lobes. A few additional chronic microhemorrhages are noted in the temporal and occipital lobes and left external capsule/posterior lentiform nucleus region. T2 hyperintensities in the periventricular greater than subcortical cerebral white matter and pons are nonspecific but compatible with mild-to-moderate chronic small vessel ischemic disease. Chronic lacunar infarcts are present in the right pons, bilateral basal ganglia, left external capsule/corona radiata, and left splenium of the corpus callosum/periatrial white matter. There is moderate cerebral atrophy. Vascular: Major intracranial vascular flow voids are preserved. Skull and upper cervical spine: Unremarkable bone marrow signal. Sinuses/Orbits: Unremarkable orbits. Minimal scattered paranasal sinus mucosal thickening. Clear mastoid air cells. Other: None. IMPRESSION: 1. No acute intracranial abnormality. 2. Numerous chronic microhemorrhages in the cerebellum, brainstem, and cerebral hemispheres which could reflect the sequelae of chronic hypertension or cerebral amyloid angiopathy. 3. 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 Electronically Signed   By: Logan Bores M.D.   On: 08/05/2017 13:29   Ct Cerebral Perfusion W Contrast  Result Date: 08/31/2017 CLINICAL DATA:  72 y/o  M; left-sided weakness and slurred speech. EXAM: CT ANGIOGRAPHY HEAD AND NECK CT PERFUSION BRAIN TECHNIQUE: Multidetector CT imaging of the head and neck was performed using the standard protocol during bolus administration of intravenous contrast. Multiplanar CT image reconstructions and MIPs were obtained to evaluate the vascular anatomy. Carotid stenosis measurements (when applicable) are obtained utilizing NASCET criteria, using the distal  internal carotid diameter as the denominator. Multiphase CT imaging of the brain was performed following IV bolus contrast injection. Subsequent parametric perfusion maps were calculated using RAPID software. CONTRAST:  182mL ISOVUE-370 IOPAMIDOL (ISOVUE-370) INJECTION 76% COMPARISON:  08/31/2017 CT of the head. FINDINGS: CTA NECK FINDINGS Aortic arch: Left vertebral artery arises from the aortic arch. Imaged portion shows no evidence of aneurysm or dissection. No significant stenosis of the major arch vessel origins. Mild fibrofatty plaque. 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 No high-grade stenosis or dissection. Left carotid system: No evidence of dissection, stenosis (50% or greater) or occlusion. Vertebral arteries: Right dominant. No evidence of dissection, stenosis (50% or greater) or occlusion. Skeleton: Mild multilevel cervical spondylosis. No high-grade bony canal stenosis. Other neck: Negative. Upper chest: Negative. Review of the MIP images confirms the above findings CTA HEAD FINDINGS Anterior circulation: No significant stenosis, proximal occlusion, aneurysm, or vascular malformation. Right cavernous ICA fibrofatty plaque with mild stenosis. Posterior circulation: No significant stenosis, proximal occlusion, aneurysm, or vascular malformation. Venous sinuses: As permitted by contrast timing, patent. Anatomic variants: Bilateral posterior communicating arteries. No anterior communicating artery, likely hypoplastic or absent. Delayed phase: No abnormal intracranial enhancement. Review of the MIP images confirms the above findings CT Brain Perfusion Findings: CBF (<30%) Volume: 88mL Perfusion (Tmax>6.0s) volume: 51mL Mismatch Volume: 57mL Infarction Location:Negative. IMPRESSION: 1. Patent carotid and vertebral arteries. No dissection or hemodynamically significant stenosis utilizing  NASCET criteria. 2. Patent anterior and posterior intracranial circulation. No large vessel occlusion, aneurysm, or significant stenosis. 3. 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  4. Negative CT brain perfusion. These results were called by telephone at the time of interpretation on 08/31/2017 at 11:49 pm to Dr. Amie Portland , who verbally acknowledged these results. Electronically Signed   By: Kristine Garbe M.D.   On: 08/31/2017 23:49   Ct Head Code Stroke Wo Contrast  Result Date: 08/31/2017 CLINICAL DATA:  Code stroke. 72 y/o M; left-sided weakness and slurred speech. EXAM: CT HEAD WITHOUT CONTRAST TECHNIQUE: Contiguous axial images were obtained from the base of the skull through the vertex without intravenous contrast. COMPARISON:  08/05/2017 MRI head.  07/25/2017 CT head. FINDINGS: Brain: No evidence of acute infarction, hemorrhage, hydrocephalus, extra-axial collection or mass lesion/mass effect. Stable chronic microvascular ischemic changes and parenchymal volume loss of the brain given differences in technique. Vascular: No hyperdense vessel or unexpected calcification. Skull: Normal. Negative for fracture or focal lesion. Sinuses/Orbits: No acute finding. Other: None. ASPECTS [facility] Stroke Program Early CT Score) - Ganglionic level infarction (caudate, lentiform nuclei, internal capsule, insula, M1-M3 cortex): 7 - Supraganglionic infarction (M4-M6 cortex): 3 Total score (0-10 with 10 being normal): 10 IMPRESSION: 1. No acute intracranial abnormality identified. 2. Stable chronic microvascular ischemic changes and parenchymal volume loss of the brain given differences in technique. 3. ASPECTS is 10 These results were communicated to Dr. Rory Percy at 11:20 pmon 4/5/2019by text page via the [facility] messaging system. Electronically Signed   By: Kristine Garbe M.D.   On: 08/31/2017 23:20    Time Spent in minutes  30   Jani Gravel M.D on 09/03/2017 at 7:02 AM  Between 7am to  7pm - Pager - [phone]  After 7pm go to [email or web address] - password [facility]  [facility] -  Office  [phone]

## 2017-09-03 NOTE — Discharge Summary (Signed)
John Shields, is a 72 y.o. male  DOB 1945-06-17  MRN [id number].  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 Leonie Man in 2 weeks.   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 Estanislado Pandy  3.UTI -Urine culture=> E. Coli,  pansensitive Cefepime (while inpatient) Keflex 500mg  po bid x 4 days.   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 Foley catheter in place   . History of sepsis 12/27/2016   sepsis from prostate abscess post prostate bx  . Hypertension   . LBBB (left bundle branch block)   . Prostate cancer, primary, 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 [facility]) urologist-  dr Tresa Moore   dx 07/ 2018 bx-- Gleason 8, PSA 400, vol 361ml-- abd. lymphadenopathy  . Renal cancer, right ([place])   . Thrombocytopenia ([place])   . Urine retention    intermittant since 2016    Past Surgical History:  Procedure Laterality Date  . NO PAST SURGERIES    . ORCHIECTOMY Bilateral 01/31/2017   Procedure: ORCHIECTOMY;  Surgeon: Alexis Frock, MD;  Location: [facility];  Service: Urology;  Laterality: Bilateral;       HPI  from the history and physical done on the day of admission:     71 y.o.malewith medical history significant formetastatic prostate cancer status post bilateral orchiectomy, right renal cell carcinoma,andhypertension,now presenting to the emergency department for evaluation of weakness and slurred speech. Patient reports that he was in his usual state of health when he sat down on his couch at approximately 8:45 PM, but when he tried  to stand at approximately 9:15 PM, he was having difficulty that he attributed to the left-sided weakness and numbness. He also reports difficulty with speech, noting that he was slurring his words. There was no headache, change in vision or hearing, chest pain, orpalpitations.Reports that symptoms are improving, but not resolved. He denies history of stroke per se, but reports that there was evidence of such on a prior MRI.  ED Course:Upon arrival to the ED, patient is found to be afebrile, saturating well on room air, and with vitals otherwise normal. EKG features a sinus rhythm and chronic left bundle branch block. Noncontrast head CT is negative for acute intracranial abnormality. Chemistry panel is unremarkable, CBC is notable  for microcytosis without anemia, INR is normal, and troponin is undetectable. Urinalysis is suggestive of infection. Neurology was consulted by the ED physician and recommends a medical admission for ongoing evaluation and management of possible CVA/TIA.       Hospital Course:     Pt was admitted and started on plavix 75mg  po qday in addition to aspirin 81mg  po qday.  Pt also started on Lipitor 80mg  po qhs for LDL=139.  Pt had glucose intolerance (Hga1c=5.9).  Pt has a R mid ICA saccular aneurysm measuring up to 12 mm and can have outpatient follow up with Dr. Estanislado Pandy.  Pt on admission had UTI and this was tx with cefepime.  Urine culture grew out E. Coli, pan sensitive.   Pt has prostate cancer with metastasis along with a right renal mass ? Primary renal cell carcinoma.  He can follow up at [facility] for this. Pt slurred speech improved and left sided weakness improved.  OT recommended Bedside Commode but pt declines.  Pt appears stable and will be discharged to home.   Follow UP  Follow-up Information    Clinic, [facility] Follow up in 1 week(s).   Contact information: [place] [address] [phone]        Garvin Fila, MD Follow up in 2 week(s).   Specialties:  Neurology, Radiology Contact information: [address] [place] [address] [phone]        Luanne Bras, MD Follow up in 2 week(s).   Specialties:  Interventional Radiology, Radiology Contact information: [address] [place] [address] [phone]            Consults obtained - neurology  Discharge Condition: stable  Diet and Activity recommendation: See Discharge Instructions below  Discharge Instructions      Discharge Medications     Allergies as of 09/03/2017   No Known Allergies     Medication List    TAKE these medications   amLODipine 5 MG tablet Commonly known as:  NORVASC Take 1 tablet (5 mg  total) by mouth daily.   aspirin EC 81 MG tablet Take 81 mg by mouth 2 (two) times daily.   atorvastatin 80 MG tablet Commonly known as:  LIPITOR Take 1 tablet (80 mg total) by mouth daily at 6 PM.   cephALEXin 500 MG capsule Commonly known as:  KEFLEX Take 1 capsule (500 mg total) by mouth every 12 (twelve) hours.   clopidogrel 75 MG tablet Commonly known as:  PLAVIX Take 1 tablet (75 mg total) by mouth daily. Start taking on:  09/04/2017   lisinopril 20 MG tablet Commonly known as:  PRINIVIL,ZESTRIL Take 1 tablet (20 mg total) by mouth daily.   meclizine 25 MG tablet Commonly known as:  ANTIVERT Take 1 tablet (25 mg total) by mouth 3 (three) times daily as needed for dizziness.   metoprolol succinate 25 MG 24 hr tablet Commonly known as:  TOPROL-XL Take 1 tablet (25 mg total) by mouth daily.   polyethylene glycol packet Commonly known as:  MIRALAX / GLYCOLAX Take 17 g by mouth daily.   tamsulosin 0.4 MG Caps capsule Commonly known as:  FLOMAX Take 0.4 mg by mouth daily.       Major procedures and Radiology Reports - PLEASE review detailed and final reports for all details, in brief -       Ct Angio Head W Or Wo Contrast  Result Date: 08/31/2017 CLINICAL DATA:  72 y/o  M; left-sided weakness and slurred speech. EXAM: CT ANGIOGRAPHY HEAD AND NECK CT PERFUSION BRAIN TECHNIQUE: Multidetector CT imaging of the head and neck was performed using the standard protocol during bolus administration of intravenous contrast. Multiplanar CT image reconstructions and MIPs were obtained to evaluate the vascular anatomy. Carotid stenosis measurements (when applicable) are obtained utilizing NASCET criteria, using the distal internal carotid diameter as the denominator. Multiphase CT imaging of the brain was performed following IV bolus contrast injection. Subsequent parametric perfusion maps were calculated using RAPID software. CONTRAST:  177mL ISOVUE-370 IOPAMIDOL (ISOVUE-370)  INJECTION 76% COMPARISON:  08/31/2017 CT of the head. FINDINGS: CTA NECK FINDINGS Aortic arch: Left vertebral artery arises from the aortic arch. Imaged portion shows no evidence of aneurysm or dissection. No significant stenosis of the major arch vessel origins. Mild fibrofatty plaque. 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 No high-grade stenosis or dissection. Left carotid system: No evidence of dissection, stenosis (50% or greater) or occlusion. Vertebral arteries: Right dominant. No evidence of dissection, stenosis (50% or greater) or occlusion. Skeleton: Mild multilevel cervical spondylosis. No high-grade bony canal stenosis. Other neck: Negative. Upper chest: Negative. Review of the MIP images confirms the above findings CTA HEAD FINDINGS Anterior circulation: No significant stenosis, proximal occlusion, aneurysm, or vascular malformation. Right cavernous ICA fibrofatty plaque with mild stenosis. Posterior circulation: No significant stenosis, proximal occlusion, aneurysm, or vascular malformation. Venous sinuses: As permitted by contrast timing, patent. Anatomic variants: Bilateral posterior communicating arteries. No anterior communicating artery, likely hypoplastic or absent. Delayed phase: No abnormal intracranial enhancement. Review of the MIP images confirms the above findings CT Brain Perfusion Findings: CBF (<30%) Volume: 18mL Perfusion (Tmax>6.0s) volume: 98mL Mismatch Volume: 63mL Infarction Location:Negative. IMPRESSION: 1. Patent carotid and vertebral arteries. No dissection or hemodynamically significant stenosis utilizing NASCET criteria. 2. Patent anterior and posterior intracranial circulation. No large vessel occlusion, aneurysm, or significant stenosis. 3. 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 4. Negative CT brain perfusion. These results were called by  telephone at the time of interpretation on 08/31/2017 at 11:49 pm to Dr. Amie Portland , who verbally acknowledged these results. Electronically Signed   By: Kristine Garbe M.D.   On: 08/31/2017 23:49   Ct Angio Neck W Or Wo Contrast  Result Date: 08/31/2017 CLINICAL DATA:  72 y/o  M; left-sided weakness and slurred speech. EXAM: CT ANGIOGRAPHY HEAD AND NECK CT PERFUSION BRAIN TECHNIQUE: Multidetector CT imaging of the head and neck was performed using the standard protocol during bolus administration of intravenous contrast. Multiplanar CT image reconstructions and MIPs were obtained to evaluate the vascular anatomy. Carotid stenosis measurements (when applicable)  are obtained utilizing NASCET criteria, using the distal internal carotid diameter as the denominator. Multiphase CT imaging of the brain was performed following IV bolus contrast injection. Subsequent parametric perfusion maps were calculated using RAPID software. CONTRAST:  127mL ISOVUE-370 IOPAMIDOL (ISOVUE-370) INJECTION 76% COMPARISON:  08/31/2017 CT of the head. FINDINGS: CTA NECK FINDINGS Aortic arch: Left vertebral artery arises from the aortic arch. Imaged portion shows no evidence of aneurysm or dissection. No significant stenosis of the major arch vessel origins. Mild fibrofatty plaque. 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 No high-grade stenosis or dissection. Left carotid system: No evidence of dissection, stenosis (50% or greater) or occlusion. Vertebral arteries: Right dominant. No evidence of dissection, stenosis (50% or greater) or occlusion. Skeleton: Mild multilevel cervical spondylosis. No high-grade bony canal stenosis. Other neck: Negative. Upper chest: Negative. Review of the MIP images confirms the above findings CTA HEAD FINDINGS Anterior circulation: No significant stenosis, proximal occlusion,  aneurysm, or vascular malformation. Right cavernous ICA fibrofatty plaque with mild stenosis. Posterior circulation: No significant stenosis, proximal occlusion, aneurysm, or vascular malformation. Venous sinuses: As permitted by contrast timing, patent. Anatomic variants: Bilateral posterior communicating arteries. No anterior communicating artery, likely hypoplastic or absent. Delayed phase: No abnormal intracranial enhancement. Review of the MIP images confirms the above findings CT Brain Perfusion Findings: CBF (<30%) Volume: 51mL Perfusion (Tmax>6.0s) volume: 11mL Mismatch Volume: 78mL Infarction Location:Negative. IMPRESSION: 1. Patent carotid and vertebral arteries. No dissection or hemodynamically significant stenosis utilizing NASCET criteria. 2. Patent anterior and posterior intracranial circulation. No large vessel occlusion, aneurysm, or significant stenosis. 3. 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 4. Negative CT brain perfusion. These results were called by telephone at the time of interpretation on 08/31/2017 at 11:49 pm to Dr. Amie Portland , who verbally acknowledged these results. Electronically Signed   By: Kristine Garbe M.D.   On: 08/31/2017 23:49   Mr Brain Wo Contrast  Result Date: 09/01/2017 CLINICAL DATA:  72 y/o  M; left-sided weakness and slurred speech. EXAM: MRI HEAD WITHOUT CONTRAST TECHNIQUE: Multiplanar, multiecho pulse sequences of the brain and surrounding structures were obtained without intravenous contrast. COMPARISON:  CT head, CT perfusion head, and CTA head dated 08/31/2017. 08/05/2017 MRI head. FINDINGS: Brain: 13 mm focus of reduced diffusion in right putamen extending into posterior corona radiata and subcentimeter focus of reduced diffusion in the right caudate body compatible with acute/early subacute infarction (series 3, image 29-33). No associated hemorrhage or mass effect. Stable scatterednonspecific foci of T2 FLAIR hyperintense signal  abnormality in subcortical and periventricular white matter are compatible withmild to moderatechronic microvascular ischemic changes for age. Moderatebrain parenchymal volume loss. Stable small chronic lacunar infarcts in the right pons, bilateral basal ganglia, left splenium of corpus callosum, and left external capsule/corona radiata. Numerous foci of susceptibility hypointensity compatible with hemosiderin deposition of chronic microhemorrhage present throughout the cerebellum, brainstem, left medial temporal lobe, and paramedian frontal parietal lobes. The distribution is bilateral ACA and PCA. Vascular: Normal flow voids. Skull and upper cervical spine: Normal marrow signal. Sinuses/Orbits: Mild diffuse paranasal sinus mucosal thickening. No significant abnormal signal of mastoid air cells. Orbits are unremarkable. Other: None. IMPRESSION: 1. Small foci of acute/early subacute infarction within the right putamen extending into corona radiata and right caudate body. No associated acute hemorrhage or mass effect. 2. Stable background of chronic microvascular ischemic  changes, parenchymal volume loss, and chronic lacunar infarcts. 3. Stable numerous foci of chronic microhemorrhage throughout ACA and PCA distributions. Findings may be related to chronic hypertension or amyloid angiopathy. These results were called by telephone at the time of interpretation on 09/01/2017 at 1:34 am to Dr. Delora Fuel , who verbally acknowledged these results. Electronically Signed   By: Kristine Garbe M.D.   On: 09/01/2017 01:37   Mr Brain Wo Contrast (neuro Protocol)  Result Date: 08/05/2017 CLINICAL DATA:  Dizziness. Ataxia. History of prostate and renal cancer. EXAM: MRI HEAD WITHOUT CONTRAST TECHNIQUE: Multiplanar, multiecho pulse sequences of the brain and surrounding structures were obtained without intravenous contrast. COMPARISON:  Head CT 07/25/2017 FINDINGS: Brain: There is no evidence of acute infarct, mass,  midline shift, or extra-axial fluid collection. There is mild cerebral atrophy. There are innumerable chronic microhemorrhages in the cerebellum and brainstem as well as parasagittal frontal and parietal lobes. A few additional chronic microhemorrhages are noted in the temporal and occipital lobes and left external capsule/posterior lentiform nucleus region. T2 hyperintensities in the periventricular greater than subcortical cerebral white matter and pons are nonspecific but compatible with mild-to-moderate chronic small vessel ischemic disease. Chronic lacunar infarcts are present in the right pons, bilateral basal ganglia, left external capsule/corona radiata, and left splenium of the corpus callosum/periatrial white matter. There is moderate cerebral atrophy. Vascular: Major intracranial vascular flow voids are preserved. Skull and upper cervical spine: Unremarkable bone marrow signal. Sinuses/Orbits: Unremarkable orbits. Minimal scattered paranasal sinus mucosal thickening. Clear mastoid air cells. Other: None. IMPRESSION: 1. No acute intracranial abnormality. 2. Numerous chronic microhemorrhages in the cerebellum, brainstem, and cerebral hemispheres which could reflect the sequelae of chronic hypertension or cerebral amyloid angiopathy. 3. 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 Electronically Signed   By: Logan Bores M.D.   On: 08/05/2017 13:29   Ct Cerebral Perfusion W Contrast  Result Date: 08/31/2017 CLINICAL DATA:  72 y/o  M; left-sided weakness and slurred speech. EXAM: CT ANGIOGRAPHY HEAD AND NECK CT PERFUSION BRAIN TECHNIQUE: Multidetector CT imaging of the head and neck was performed using the standard protocol during bolus administration of intravenous contrast. Multiplanar CT image reconstructions and MIPs were obtained to evaluate the vascular anatomy. Carotid stenosis measurements (when applicable) are obtained utilizing NASCET criteria, using the distal  internal carotid diameter as the denominator. Multiphase CT imaging of the brain was performed following IV bolus contrast injection. Subsequent parametric perfusion maps were calculated using RAPID software. CONTRAST:  170mL ISOVUE-370 IOPAMIDOL (ISOVUE-370) INJECTION 76% COMPARISON:  08/31/2017 CT of the head. FINDINGS: CTA NECK FINDINGS Aortic arch: Left vertebral artery arises from the aortic arch. Imaged portion shows no evidence of aneurysm or dissection. No significant stenosis of the major arch vessel origins. Mild fibrofatty plaque. 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 No high-grade stenosis or dissection. Left carotid system: No evidence of dissection, stenosis (50% or greater) or occlusion. Vertebral arteries: Right dominant. No evidence of dissection, stenosis (50% or greater) or occlusion. Skeleton: Mild multilevel cervical spondylosis. No high-grade bony canal stenosis. Other neck: Negative. Upper chest: Negative. Review of the MIP images confirms the above findings CTA HEAD FINDINGS Anterior circulation: No significant stenosis, proximal occlusion, aneurysm, or vascular malformation. Right cavernous ICA fibrofatty plaque with mild stenosis. Posterior circulation: No significant stenosis, proximal occlusion, aneurysm, or vascular malformation. Venous  sinuses: As permitted by contrast timing, patent. Anatomic variants: Bilateral posterior communicating arteries. No anterior communicating artery, likely hypoplastic or absent. Delayed phase: No abnormal intracranial enhancement. Review of the MIP images confirms the above findings CT Brain Perfusion Findings: CBF (<30%) Volume: 64mL Perfusion (Tmax>6.0s) volume: 34mL Mismatch Volume: 90mL Infarction Location:Negative. IMPRESSION: 1. Patent carotid and vertebral arteries. No dissection or hemodynamically significant stenosis utilizing  NASCET criteria. 2. Patent anterior and posterior intracranial circulation. No large vessel occlusion, aneurysm, or significant stenosis. 3. 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 4. Negative CT brain perfusion. These results were called by telephone at the time of interpretation on 08/31/2017 at 11:49 pm to Dr. Amie Portland , who verbally acknowledged these results. Electronically Signed   By: Kristine Garbe M.D.   On: 08/31/2017 23:49   Ct Head Code Stroke Wo Contrast  Result Date: 08/31/2017 CLINICAL DATA:  Code stroke. 72 y/o M; left-sided weakness and slurred speech. EXAM: CT HEAD WITHOUT CONTRAST TECHNIQUE: Contiguous axial images were obtained from the base of the skull through the vertex without intravenous contrast. COMPARISON:  08/05/2017 MRI head.  07/25/2017 CT head. FINDINGS: Brain: No evidence of acute infarction, hemorrhage, hydrocephalus, extra-axial collection or mass lesion/mass effect. Stable chronic microvascular ischemic changes and parenchymal volume loss of the brain given differences in technique. Vascular: No hyperdense vessel or unexpected calcification. Skull: Normal. Negative for fracture or focal lesion. Sinuses/Orbits: No acute finding. Other: None. ASPECTS [facility] Stroke Program Early CT Score) - Ganglionic level infarction (caudate, lentiform nuclei, internal capsule, insula, M1-M3 cortex): 7 - Supraganglionic infarction (M4-M6 cortex): 3 Total score (0-10 with 10 being normal): 10 IMPRESSION: 1. No acute intracranial abnormality identified. 2. Stable chronic microvascular ischemic changes and parenchymal volume loss of the brain given differences in technique. 3. ASPECTS is 10 These results were communicated to Dr. Rory Percy at 11:20 pmon 4/5/2019by text page via the [facility] messaging system. Electronically Signed   By: Kristine Garbe M.D.   On: 08/31/2017 23:20    Micro Results     Recent Results (from the past 240 hour(s))  Culture, Urine      Status: Abnormal (Preliminary result)   Collection Time: 09/01/17 12:40 AM  Result Value Ref Range Status   Specimen Description URINE, RANDOM  Final   Special Requests NONE  Final   Culture (A)  Final    >=100,000 COLONIES/mL ESCHERICHIA COLI CULTURE REINCUBATED FOR BETTER GROWTH Performed at [facility], [address], [place], [place] [address]    Report Status PENDING  Incomplete   Organism ID, Bacteria ESCHERICHIA COLI (A)  Final      Susceptibility   Escherichia coli - MIC*    AMPICILLIN <=2 SENSITIVE Sensitive     CEFAZOLIN <=4 SENSITIVE Sensitive     CEFTRIAXONE <=1 SENSITIVE Sensitive     CIPROFLOXACIN <=0.25 SENSITIVE Sensitive     GENTAMICIN <=1 SENSITIVE Sensitive     IMIPENEM <=0.25 SENSITIVE Sensitive     NITROFURANTOIN <=16 SENSITIVE Sensitive     TRIMETH/SULFA <=20 SENSITIVE Sensitive     AMPICILLIN/SULBACTAM <=2 SENSITIVE Sensitive     PIP/TAZO <=4 SENSITIVE Sensitive     Extended ESBL NEGATIVE Sensitive     * >=100,000 COLONIES/mL ESCHERICHIA COLI       Today   Subjective    Ordean Fouts today has no slurred speech, trace left sided weakness.   No headache,no chest abdominal pain,no new weakness tingling or numbness, feels much better wants to go home  today.    Objective   Blood pressure 110/68, pulse 80, temperature 98.2 F (36.8 C), temperature source Oral, resp. rate 20, height 6' (1.829 m), weight 98.8 kg (217 lb 13 oz), SpO2 96 %.   Intake/Output Summary (Last 24 hours) at 09/03/2017 1736 Last data filed at 09/02/2017 2353 Gross per 24 hour  Intake 100 ml  Output 425 ml  Net -325 ml    Exam Awake Alert, Oriented x 3, No new F.N deficits, Normal affect [place].AT,PERRAL Supple Neck,No JVD, No cervical lymphadenopathy appriciated.  Symmetrical Chest wall movement, Good air movement bilaterally, CTAB RRR,No Gallops,Rubs or new Murmurs, No Parasternal Heave +ve B.Sounds, Abd Soft, Non tender, No organomegaly appriciated, No rebound  -guarding or rigidity. No Cyanosis, Clubbing or edema, No new Rash or bruise   Data Review   CBC w Diff:  Lab Results  Component Value Date   WBC 5.3 09/03/2017   HGB 12.5 (L) 09/03/2017   HCT 39.3 09/03/2017   PLT 179 09/03/2017   LYMPHOPCT 49 08/31/2017   MONOPCT 10 08/31/2017   EOSPCT 3 08/31/2017   BASOPCT 0 08/31/2017    CMP:  Lab Results  Component Value Date   NA 136 09/03/2017   K 4.6 09/03/2017   CL 100 (L) 09/03/2017   CO2 26 09/03/2017   BUN 17 09/03/2017   CREATININE 0.97 09/03/2017   PROT 7.0 09/03/2017   ALBUMIN 3.7 09/03/2017   BILITOT 1.0 09/03/2017   ALKPHOS 48 09/03/2017   AST 19 09/03/2017   ALT 19 09/03/2017  .   Total Time in preparing paper work, data evaluation and todays exam - 21 minutes  Jani Gravel M.D on 09/03/2017 at 5:36 PM  [facility]   Office  [phone]

## 2017-09-03 NOTE — Progress Notes (Signed)
  Echocardiogram 2D Echocardiogram has been performed.  John Shields 09/03/2017, 8:50 AM

## 2017-09-03 NOTE — Progress Notes (Signed)
Patient presented to ED 08/31/2017 as CODE STROKE.  CTA head/neck 08/31/2017: 1. Patent carotid and vertebral arteries. No dissection or hemodynamically significant stenosis utilizing NASCET criteria. 2. Patent anterior and posterior intracranial circulation. No large vessel occlusion, aneurysm, or significant stenosis. 3. 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 4. Negative CT brain perfusion.  Dr. Estanislado Pandy in IR consulted by Dr. Leonie Man for management of right mid ICA aneurysm.  Visited patient on floor with Dr. Estanislado Pandy. Patient sitting in bed watching TV. Explained why we were consulted, and that we plan to meet with him outpatient following discharge to discuss management of his aneurysm. Informed patient that one of our schedulers will call him once discharged to set up appointment.  All questions answered and concerns addressed. Patient conveys understanding and agrees with plan.  [name], PA-C 09/03/2017, 10:04 AM

## 2017-09-03 NOTE — Progress Notes (Signed)
Occupational Therapy Treatment Patient Details Name: Brendin Situ MRN: [id number] DOB: 01/07/1946 Today's Date: 09/03/2017    History of present illness Pt is a 72 y.o. male with medical history significant formetastatic prostate cancer status post bilateral orchiectomy, right renal cell carcinoma,andhypertension who presented to the ED with L UE weakness, numbness, and slurred speech. MRI revealed small foci of acute/early subacute infarction within the right   OT comments  Pt demonstrating progress toward OT goals this session. He was able to complete toilet transfers and standing grooming tasks with supervision overall this session. However, did notice decreased awareness, poor delayed recall and short-term memory, and decreased ability to alternate attention impacting his ability to complete simulated IADL tasks in hallway environment. These deficits are impacting his ability to complete executive functioning tasks required for safety in his home. Pt would benefit from further OT cognitive assessment and treatment to maximize safety with ADL and IADL prior to return home. Lastly noted pt to bump into items on the L side of the environment multiple times during session but with little awareness of this. Will continue to follow while admitted.    Follow Up Recommendations  Home health OT;Supervision - Intermittent    Equipment Recommendations  3 in 1 bedside commode    Recommendations for Other Services PT consult    Precautions / Restrictions Precautions Precautions: Fall       Mobility Bed Mobility Overal bed mobility: Needs Assistance Bed Mobility: Supine to Sit;Sit to Supine     Supine to sit: Supervision Sit to supine: Supervision   General bed mobility comments: Supervision for safety.  Transfers Overall transfer level: Needs assistance Equipment used: None Transfers: Sit to/from Stand Sit to Stand: Supervision         General transfer comment: supervision for  safety    Balance Overall balance assessment: Needs assistance Sitting-balance support: Feet supported Sitting balance-Leahy Scale: Good     Standing balance support: No upper extremity supported;During functional activity Standing balance-Leahy Scale: Good Standing balance comment: Able to complete dynamic standing tasks with supervision.                            ADL either performed or assessed with clinical judgement   ADL Overall ADL's : Needs assistance/impaired     Grooming: Wash/dry hands;Supervision/safety;Standing                   Toilet Transfer: Supervision/safety;Ambulation   Toileting- Clothing Manipulation and Hygiene: Supervision/safety;Sit to/from stand       Functional mobility during ADLs: Supervision/safety General ADL Comments: Able to empty catheter bag with supervision standing at commode.      Vision       Perception     Praxis      Cognition Arousal/Alertness: Awake/alert Behavior During Therapy: WFL for tasks assessed/performed Overall Cognitive Status: Impaired/Different from baseline Area of Impairment: Memory;Attention;Awareness                   Current Attention Level: Selective Memory: Decreased short-term memory     Awareness: Emergent   General Comments: Pt using humor throughout session and question if this is a compensatory strategy to cover up for cognitive deficits. Pt with poor ability to complete serial 7's in hallway. Off topic conversation throughout and diverting from answering pt's questions.         Exercises     Shoulder Instructions       General Comments  Pertinent Vitals/ Pain       Pain Assessment: No/denies pain  Home Living                                          Prior Functioning/Environment              Frequency  Min 2X/week        Progress Toward Goals  OT Goals(current goals can now be found in the care plan section)  Progress  towards OT goals: Progressing toward goals  Acute Rehab OT Goals Patient Stated Goal: regain/maintain independence OT Goal Formulation: With patient Time For Goal Achievement: 09/08/17 Potential to Achieve Goals: Good  Plan Discharge plan remains appropriate    Co-evaluation                 AM-PAC PT "6 Clicks" Daily Activity     Outcome Measure                    End of Session Equipment Utilized During Treatment: Gait belt  OT Visit Diagnosis: Unsteadiness on feet (R26.81);Muscle weakness (generalized) (M62.81);Other symptoms and signs involving the nervous system (R29.898)   Activity Tolerance Patient tolerated treatment well   Patient Left with call bell/phone within reach;in bed   Nurse Communication Mobility status        Time: 1583-0940 OT Time Calculation (min): 28 min  Charges: OT General Charges $OT Visit: 1 Visit OT Treatments $Self Care/Home Management : 23-37 mins  Norman Herrlich, MS OTR/L  Pager: [place] 09/03/2017, 4:44 PM

## 2017-09-03 NOTE — Evaluation (Signed)
Physical Therapy Evaluation Patient Details Name: John Shields MRN: [id number] DOB: 01-26-1946 Today's Date: 09/03/2017   History of Present Illness  72 yo male with onset of early stroke to corona radiata, caudate body.  Has UTI, L side weakness, metastatic prostate CA, renal CA, HTN  Clinical Impression  Pt progressing towards goals. Mild unsteadiness noted during dynamic gait tasks, however, no overt LOB noted. Did require redirection to task as pt easily distracted. Current recommendations appropriate and recommend supervision for mobility at home to ensure safety. Will continue to follow acutely to maximize functional mobility independence and safety.     Follow Up Recommendations Home health PT;Supervision for mobility/OOB    Equipment Recommendations  None recommended by PT    Recommendations for Other Services       Precautions / Restrictions Precautions Precautions: Fall Restrictions Weight Bearing Restrictions: No      Mobility  Bed Mobility Overal bed mobility: Needs Assistance Bed Mobility: Supine to Sit;Sit to Supine     Supine to sit: Supervision Sit to supine: Min guard      Transfers Overall transfer level: Needs assistance Equipment used: None Transfers: Sit to/from Stand Sit to Stand: Supervision         General transfer comment: supervision for safety  Ambulation/Gait Ambulation/Gait assistance: Min guard;Supervision Ambulation Distance (Feet): 300 Feet Assistive device: None Gait Pattern/deviations: Step-through pattern;Decreased stride length;Wide base of support Gait velocity: decreased Gait velocity interpretation: Below normal speed for age/gender General Gait Details: Very short step length and slow, guarded gait. Able to increase gait speed upon request, however, stated he prefered to walk at slower pace. Able to perform dynamic gait tasks with only mild unsteadiness. No overt LOB noted.   Stairs            Wheelchair  Mobility    Modified Rankin (Stroke Patients Only) Modified Rankin (Stroke Patients Only) Pre-Morbid Rankin Score: Slight disability Modified Rankin: Moderately severe disability     Balance Overall balance assessment: Needs assistance Sitting-balance support: Feet supported Sitting balance-Leahy Scale: Good     Standing balance support: No upper extremity supported;During functional activity Standing balance-Leahy Scale: Good Standing balance comment: Able to empty catheter back without LOB, brush teeth at sink without UE support, and perform dynamic gait tasks.                High Level Balance Comments: Performed multiple horizontal head turns during gait X 10 and performed change in gait speed tasks X 5 this session. Min guard for steadying assist and required cues for attention to task.              Pertinent Vitals/Pain Pain Assessment: No/denies pain    Home Living                        Prior Function                 Hand Dominance        Extremity/Trunk Assessment                Communication      Cognition Arousal/Alertness: Awake/alert Behavior During Therapy: WFL for tasks assessed/performed Overall Cognitive Status: Within Functional Limits for tasks assessed                                 General Comments: Pt very talkative throughout and avoiding some questions. Seemed to  talk off topic and required cues for redirection.       General Comments General comments (skin integrity, edema, etc.): REports fatigue and wooziness during gait, hwoever, VSS.     Exercises     Assessment/Plan    PT Assessment    PT Problem List         PT Treatment Interventions      PT Goals (Current goals can be found in the Care Plan section)  Acute Rehab PT Goals Patient Stated Goal: regain/maintain independence PT Goal Formulation: With patient Time For Goal Achievement: 09/15/17 Potential to Achieve Goals:  Good    Frequency Min 3X/week   Barriers to discharge        Co-evaluation               AM-PAC PT "6 Clicks" Daily Activity  Outcome Measure Difficulty turning over in bed (including adjusting bedclothes, sheets and blankets)?: None Difficulty moving from lying on back to sitting on the side of the bed? : A Little Difficulty sitting down on and standing up from a chair with arms (e.g., wheelchair, bedside commode, etc,.)?: None Help needed moving to and from a bed to chair (including a wheelchair)?: A Little Help needed walking in hospital room?: A Little Help needed climbing 3-5 steps with a railing? : A Little 6 Click Score: 20    End of Session Equipment Utilized During Treatment: Gait belt Activity Tolerance: Patient tolerated treatment well Patient left: in bed;with call bell/phone within reach Nurse Communication: Mobility status PT Visit Diagnosis: Unsteadiness on feet (R26.81);Muscle weakness (generalized) (M62.81);Difficulty in walking, not elsewhere classified (R26.2);Hemiplegia and hemiparesis    Time: 1046-1110 PT Time Calculation (min) (ACUTE ONLY): 24 min   Charges:     PT Treatments $Gait Training: 8-22 mins $Therapeutic Activity: 8-22 mins   PT G Codes:        Leighton Ruff, PT, DPT  Acute Rehabilitation Services  Pager: [phone]   Rudean Hitt 09/03/2017, 11:23 AM

## 2017-09-05 LAB — URINE CULTURE: Culture: >=100000 — AB

## 2017-09-12 DIAGNOSIS — C61 Malignant neoplasm of prostate: Secondary | ICD-10-CM | POA: Diagnosis not present

## 2017-09-13 ENCOUNTER — Telehealth (HOSPITAL_COMMUNITY): Payer: Self-pay | Admitting: Radiology

## 2017-09-13 NOTE — Telephone Encounter (Signed)
Called pt, left VM for him to call to schedule. JM

## 2017-09-19 DIAGNOSIS — C641 Malignant neoplasm of right kidney, except renal pelvis: Secondary | ICD-10-CM | POA: Diagnosis not present

## 2017-09-19 DIAGNOSIS — C649 Malignant neoplasm of unspecified kidney, except renal pelvis: Secondary | ICD-10-CM | POA: Diagnosis not present

## 2017-09-19 DIAGNOSIS — C61 Malignant neoplasm of prostate: Secondary | ICD-10-CM | POA: Diagnosis not present

## 2017-10-02 DIAGNOSIS — C775 Secondary and unspecified malignant neoplasm of intrapelvic lymph nodes: Secondary | ICD-10-CM | POA: Diagnosis not present

## 2017-10-02 DIAGNOSIS — C7951 Secondary malignant neoplasm of bone: Secondary | ICD-10-CM | POA: Diagnosis not present

## 2017-10-02 DIAGNOSIS — C61 Malignant neoplasm of prostate: Secondary | ICD-10-CM | POA: Diagnosis not present

## 2017-10-02 DIAGNOSIS — C641 Malignant neoplasm of right kidney, except renal pelvis: Secondary | ICD-10-CM | POA: Diagnosis not present

## 2017-10-02 DIAGNOSIS — R338 Other retention of urine: Secondary | ICD-10-CM | POA: Diagnosis not present

## 2018-03-29 DIAGNOSIS — C61 Malignant neoplasm of prostate: Secondary | ICD-10-CM | POA: Diagnosis not present

## 2018-04-08 DIAGNOSIS — C61 Malignant neoplasm of prostate: Secondary | ICD-10-CM | POA: Diagnosis not present

## 2018-04-08 DIAGNOSIS — C775 Secondary and unspecified malignant neoplasm of intrapelvic lymph nodes: Secondary | ICD-10-CM | POA: Diagnosis not present

## 2018-04-08 DIAGNOSIS — C641 Malignant neoplasm of right kidney, except renal pelvis: Secondary | ICD-10-CM | POA: Diagnosis not present

## 2018-04-08 DIAGNOSIS — R338 Other retention of urine: Secondary | ICD-10-CM | POA: Diagnosis not present

## 2018-09-30 DIAGNOSIS — C61 Malignant neoplasm of prostate: Secondary | ICD-10-CM | POA: Diagnosis not present

## 2018-10-02 DIAGNOSIS — C641 Malignant neoplasm of right kidney, except renal pelvis: Secondary | ICD-10-CM | POA: Diagnosis not present

## 2018-10-02 DIAGNOSIS — C61 Malignant neoplasm of prostate: Secondary | ICD-10-CM | POA: Diagnosis not present

## 2018-10-07 DIAGNOSIS — C641 Malignant neoplasm of right kidney, except renal pelvis: Secondary | ICD-10-CM | POA: Diagnosis not present

## 2018-10-07 DIAGNOSIS — C7951 Secondary malignant neoplasm of bone: Secondary | ICD-10-CM | POA: Diagnosis not present

## 2018-10-07 DIAGNOSIS — C775 Secondary and unspecified malignant neoplasm of intrapelvic lymph nodes: Secondary | ICD-10-CM | POA: Diagnosis not present

## 2018-10-07 DIAGNOSIS — R338 Other retention of urine: Secondary | ICD-10-CM | POA: Diagnosis not present

## 2018-10-07 DIAGNOSIS — C61 Malignant neoplasm of prostate: Secondary | ICD-10-CM | POA: Diagnosis not present

## 2018-11-04 ENCOUNTER — Ambulatory Visit: Payer: PRIVATE HEALTH INSURANCE | Admitting: Family

## 2019-01-25 DIAGNOSIS — Z23 Encounter for immunization: Secondary | ICD-10-CM | POA: Diagnosis not present

## 2019-03-21 ENCOUNTER — Emergency Department (HOSPITAL_COMMUNITY): Payer: Medicare Other

## 2019-03-21 ENCOUNTER — Encounter (HOSPITAL_COMMUNITY): Payer: Self-pay

## 2019-03-21 ENCOUNTER — Other Ambulatory Visit: Payer: Self-pay

## 2019-03-21 ENCOUNTER — Observation Stay (HOSPITAL_COMMUNITY)
Admission: EM | Admit: 2019-03-21 | Discharge: 2019-03-22 | Disposition: A | Discharge status: Home / Self Care | Payer: Medicare Other | Attending: Internal Medicine | Admitting: Internal Medicine

## 2019-03-21 DIAGNOSIS — R338 Other retention of urine: Secondary | ICD-10-CM | POA: Insufficient documentation

## 2019-03-21 DIAGNOSIS — Z8546 Personal history of malignant neoplasm of prostate: Secondary | ICD-10-CM | POA: Diagnosis not present

## 2019-03-21 DIAGNOSIS — E785 Hyperlipidemia, unspecified: Secondary | ICD-10-CM | POA: Insufficient documentation

## 2019-03-21 DIAGNOSIS — D649 Anemia, unspecified: Secondary | ICD-10-CM | POA: Insufficient documentation

## 2019-03-21 DIAGNOSIS — I671 Cerebral aneurysm, nonruptured: Secondary | ICD-10-CM | POA: Diagnosis not present

## 2019-03-21 DIAGNOSIS — Z8679 Personal history of other diseases of the circulatory system: Secondary | ICD-10-CM | POA: Insufficient documentation

## 2019-03-21 DIAGNOSIS — R42 Dizziness and giddiness: Secondary | ICD-10-CM | POA: Diagnosis not present

## 2019-03-21 DIAGNOSIS — Z85528 Personal history of other malignant neoplasm of kidney: Secondary | ICD-10-CM | POA: Insufficient documentation

## 2019-03-21 DIAGNOSIS — I10 Essential (primary) hypertension: Secondary | ICD-10-CM | POA: Diagnosis not present

## 2019-03-21 DIAGNOSIS — Z7982 Long term (current) use of aspirin: Secondary | ICD-10-CM | POA: Diagnosis not present

## 2019-03-21 DIAGNOSIS — N179 Acute kidney failure, unspecified: Secondary | ICD-10-CM | POA: Insufficient documentation

## 2019-03-21 DIAGNOSIS — N401 Enlarged prostate with lower urinary tract symptoms: Secondary | ICD-10-CM | POA: Insufficient documentation

## 2019-03-21 DIAGNOSIS — Z20828 Contact with and (suspected) exposure to other viral communicable diseases: Secondary | ICD-10-CM | POA: Insufficient documentation

## 2019-03-21 DIAGNOSIS — N2889 Other specified disorders of kidney and ureter: Secondary | ICD-10-CM | POA: Insufficient documentation

## 2019-03-21 DIAGNOSIS — E872 Acidosis: Secondary | ICD-10-CM | POA: Insufficient documentation

## 2019-03-21 DIAGNOSIS — Z7902 Long term (current) use of antithrombotics/antiplatelets: Secondary | ICD-10-CM | POA: Insufficient documentation

## 2019-03-21 DIAGNOSIS — I447 Left bundle-branch block, unspecified: Secondary | ICD-10-CM | POA: Diagnosis not present

## 2019-03-21 DIAGNOSIS — Z9079 Acquired absence of other genital organ(s): Secondary | ICD-10-CM | POA: Diagnosis not present

## 2019-03-21 DIAGNOSIS — Z79899 Other long term (current) drug therapy: Secondary | ICD-10-CM | POA: Insufficient documentation

## 2019-03-21 DIAGNOSIS — R55 Syncope and collapse: Secondary | ICD-10-CM | POA: Diagnosis not present

## 2019-03-21 DIAGNOSIS — Z8673 Personal history of transient ischemic attack (TIA), and cerebral infarction without residual deficits: Secondary | ICD-10-CM | POA: Insufficient documentation

## 2019-03-21 DIAGNOSIS — Z8249 Family history of ischemic heart disease and other diseases of the circulatory system: Secondary | ICD-10-CM | POA: Diagnosis not present

## 2019-03-21 DIAGNOSIS — R61 Generalized hyperhidrosis: Secondary | ICD-10-CM | POA: Diagnosis not present

## 2019-03-21 DIAGNOSIS — R0609 Other forms of dyspnea: Secondary | ICD-10-CM | POA: Insufficient documentation

## 2019-03-21 DIAGNOSIS — R531 Weakness: Secondary | ICD-10-CM | POA: Diagnosis not present

## 2019-03-21 DIAGNOSIS — C61 Malignant neoplasm of prostate: Secondary | ICD-10-CM | POA: Diagnosis not present

## 2019-03-21 DIAGNOSIS — R0602 Shortness of breath: Secondary | ICD-10-CM

## 2019-03-21 DIAGNOSIS — R739 Hyperglycemia, unspecified: Secondary | ICD-10-CM

## 2019-03-21 DIAGNOSIS — G459 Transient cerebral ischemic attack, unspecified: Secondary | ICD-10-CM | POA: Diagnosis not present

## 2019-03-21 DIAGNOSIS — I119 Hypertensive heart disease without heart failure: Secondary | ICD-10-CM | POA: Diagnosis not present

## 2019-03-21 LAB — CBC
HCT: 42.4 % (ref 39.0–52.0)
Hemoglobin: 12.9 g/dL — ABNORMAL LOW (ref 13.0–17.0)
MCH: 23.2 pg — ABNORMAL LOW (ref 26.0–34.0)
MCHC: 30.4 g/dL (ref 30.0–36.0)
MCV: 76.3 fL — ABNORMAL LOW (ref 80.0–100.0)
Platelets: 218 10*3/uL (ref 150–400)
RBC: 5.56 MIL/uL (ref 4.22–5.81)
RDW: 16.2 % — ABNORMAL HIGH (ref 11.5–15.5)
WBC: 6.5 10*3/uL (ref 4.0–10.5)
nRBC: 0 % (ref 0.0–0.2)

## 2019-03-21 LAB — HEPATIC FUNCTION PANEL
ALT: 41 U/L (ref 0–44)
AST: 29 U/L (ref 15–41)
Albumin: 4 g/dL (ref 3.5–5.0)
Alkaline Phosphatase: 60 U/L (ref 38–126)
Bilirubin, Direct: <0.1 mg/dL (ref 0.0–0.2)
Total Bilirubin: 0.5 mg/dL (ref 0.3–1.2)
Total Protein: 7.5 g/dL (ref 6.5–8.1)

## 2019-03-21 LAB — BASIC METABOLIC PANEL
Anion gap: 9 (ref 5–15)
BUN: 24 mg/dL — ABNORMAL HIGH (ref 8–23)
CO2: 25 mmol/L (ref 22–32)
Calcium: 9.3 mg/dL (ref 8.9–10.3)
Chloride: 105 mmol/L (ref 98–111)
Creatinine, Ser: 1.42 mg/dL — ABNORMAL HIGH (ref 0.61–1.24)
GFR calc Af Amer: 57 mL/min — ABNORMAL LOW (ref >60)
GFR calc non Af Amer: 49 mL/min — ABNORMAL LOW (ref >60)
Glucose, Bld: 152 mg/dL — ABNORMAL HIGH (ref 70–99)
Potassium: 4.9 mmol/L (ref 3.5–5.1)
Sodium: 139 mmol/L (ref 135–145)

## 2019-03-21 LAB — URINALYSIS, ROUTINE W REFLEX MICROSCOPIC
Bilirubin Urine: NEGATIVE
Glucose, UA: NEGATIVE mg/dL
Ketones, ur: NEGATIVE mg/dL
Nitrite: POSITIVE — AB
Protein, ur: 30 mg/dL — AB
RBC / HPF: >50 RBC/hpf — ABNORMAL HIGH (ref 0–5)
Specific Gravity, Urine: 1.029 (ref 1.005–1.030)
WBC, UA: >50 WBC/hpf — ABNORMAL HIGH (ref 0–5)
pH: 5 (ref 5.0–8.0)

## 2019-03-21 LAB — LACTIC ACID, PLASMA
Lactic Acid, Venous: 2.3 mmol/L (ref 0.5–1.9)
Lactic Acid, Venous: 1.3 mmol/L (ref 0.5–1.9)

## 2019-03-21 LAB — CBG MONITORING, ED: Glucose-Capillary: 126 mg/dL — ABNORMAL HIGH (ref 70–99)

## 2019-03-21 LAB — TROPONIN I (HIGH SENSITIVITY)
Troponin I (High Sensitivity): 4 ng/L (ref <18)
Troponin I (High Sensitivity): 4 ng/L (ref <18)

## 2019-03-21 LAB — TSH: TSH: 0.985 u[IU]/mL (ref 0.350–4.500)

## 2019-03-21 MED ORDER — ENOXAPARIN SODIUM 40 MG/0.4ML ~~LOC~~ SOLN
40.0000 mg | Freq: Every day | SUBCUTANEOUS | Status: DC
  Administered 2019-03-22: 40 mg via SUBCUTANEOUS
  Filled 2019-03-21: qty 0.4

## 2019-03-21 MED ORDER — ASPIRIN EC 81 MG PO TBEC
81.0000 mg | DELAYED_RELEASE_TABLET | Freq: Two times a day (BID) | ORAL | Status: DC
  Administered 2019-03-21 – 2019-03-22 (×2): 81 mg via ORAL
  Filled 2019-03-21 (×2): qty 1

## 2019-03-21 MED ORDER — SODIUM CHLORIDE 0.9 % IV BOLUS
1000.0000 mL | Freq: Once | INTRAVENOUS | Status: AC
  Administered 2019-03-22: 1000 mL via INTRAVENOUS

## 2019-03-21 MED ORDER — IOHEXOL 350 MG/ML SOLN
75.0000 mL | Freq: Once | INTRAVENOUS | Status: AC | PRN
  Administered 2019-03-21: 75 mL via INTRAVENOUS

## 2019-03-21 MED ORDER — AMLODIPINE BESYLATE 5 MG PO TABS
5.0000 mg | ORAL_TABLET | Freq: Every day | ORAL | Status: DC
  Administered 2019-03-22: 5 mg via ORAL
  Filled 2019-03-21: qty 1

## 2019-03-21 MED ORDER — ATORVASTATIN CALCIUM 80 MG PO TABS
80.0000 mg | ORAL_TABLET | Freq: Every day | ORAL | Status: DC
  Administered 2019-03-21: 80 mg via ORAL
  Filled 2019-03-21: qty 1

## 2019-03-21 MED ORDER — TAMSULOSIN HCL 0.4 MG PO CAPS
0.4000 mg | ORAL_CAPSULE | Freq: Every day | ORAL | Status: DC
  Administered 2019-03-22: 0.4 mg via ORAL
  Filled 2019-03-21: qty 1

## 2019-03-21 MED ORDER — METOPROLOL TARTRATE 25 MG PO TABS
25.0000 mg | ORAL_TABLET | Freq: Every day | ORAL | Status: DC
  Administered 2019-03-22: 25 mg via ORAL
  Filled 2019-03-21: qty 1

## 2019-03-21 NOTE — ED Notes (Signed)
Pt stood to urinate. Pt c/o weakness in legs but was able to stand without assistance.

## 2019-03-21 NOTE — ED Provider Notes (Signed)
[facility] EMERGENCY DEPARTMENT Provider Note   CSN: [id number] Arrival date & time: 03/21/19  1400     History   Chief Complaint Chief Complaint  Patient presents with  . Dizziness    HPI John Shields is a 73 y.o. male.      Dizziness Quality:  Lightheadedness Severity:  Severe Onset quality:  Sudden Duration:  5 hours Timing:  Constant Progression:  Worsening Chronicity:  New Context: physical activity   Context comment:  Patient has a history of right renal carcinoma as well as prostate cancer with metastasis Relieved by:  Nothing Worsened by:  Nothing Associated symptoms: nausea and shortness of breath   Associated symptoms: no blood in stool, no chest pain, no palpitations and no vomiting   Associated symptoms comment:  Patient also had diaphoresis and palpitations.  While he was in the waiting room he was noted to have a presyncopal episode accompanied by diaphoresis and a concerning pallor to his complexion.  Patient was brought back to the room immediately and evaluated.  States that his symptoms had recurred and had in fact worsened.  He felt lightheaded and diaphoretic Risk factors: multiple medications     Past Medical History:  Diagnosis Date  . Foley catheter in place   . History of sepsis 12/27/2016   sepsis from prostate abscess post prostate bx  . Hypertension   . LBBB (left bundle branch block)   . Prostate cancer, primary, 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 [facility]) urologist-  dr Tresa Moore   dx 07/ 2018 bx-- Gleason 8, PSA 400, vol 375ml-- abd. lymphadenopathy  . Renal cancer, right ([place])   . Thrombocytopenia ([place])   . Urine retention    intermittant since 2016    Patient Active Problem List   Diagnosis Date Noted  . Dizziness 03/21/2019  . History of CVA (cerebrovascular accident)   . History of aneurysm   . Dyslipidemia   . Acute left-sided weakness 09/01/2017  . Acute lower UTI 09/01/2017  . Cerebral thrombosis  with cerebral infarction 09/01/2017  . Near syncope 07/25/2017  . 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 Left bundle branch block (LBBB) on electrocardiogram   . H/O bilateral orchiectomies   . Dyspnea on exertion   . BPH (benign prostatic hyperplasia) 12/27/2016  . Renal mass 12/27/2016  . Thrombocytopenia ([place]) 12/27/2016  . Sepsis ([place]) 12/27/2016  . Elevated LFTs   . Lactic acidosis   . AKI (acute kidney injury) ([place])   . Urinary retention 06/07/2016  . Constipation 06/07/2016  . Acute renal failure (ARF) ([place]) 06/07/2016  . Essential hypertension 06/07/2016  . Hyperglycemia 06/07/2016    Past Surgical History:  Procedure Laterality Date  . NO PAST SURGERIES    . ORCHIECTOMY Bilateral 01/31/2017   Procedure: ORCHIECTOMY;  Surgeon: Alexis Frock, MD;  Location: [facility];  Service: Urology;  Laterality: Bilateral;        Home Medications    Prior to Admission medications   Medication Sig Start Date End Date Taking? Authorizing Provider  amLODipine (NORVASC) 5 MG tablet Take 1 tablet (5 mg total) by mouth daily. 01/03/17 09/01/23 Yes Johnson, Clanford L, MD  aspirin EC 81 MG tablet Take 81 mg by mouth 2 (two) times daily.   Yes [provider]  atorvastatin (LIPITOR) 80 MG tablet Take 1 tablet (80 mg total) by mouth daily at 6 PM. Patient taking differently: Take 80 mg by mouth  at bedtime.  09/03/17  Yes Jani Gravel, MD  b complex vitamins tablet Take 1 tablet by mouth daily.   Yes [provider]  Cholecalciferol (VITAMIN D3 PO) Take 1 tablet by mouth daily.   Yes [provider]  lisinopril (PRINIVIL,ZESTRIL) 20 MG tablet Take 1 tablet (20 mg total) by mouth daily. 01/03/17 08/31/24 Yes Johnson, Clanford L, MD  metoprolol tartrate (LOPRESSOR) 25 MG tablet Take 25 mg by mouth daily.   Yes [provider]  tamsulosin (FLOMAX) 0.4 MG CAPS capsule Take 0.4 mg by mouth daily.    Yes [provider]  clopidogrel (PLAVIX) 75 MG tablet Take 1 tablet (75 mg total) by mouth daily. Patient not taking: Reported on 03/21/2019 09/04/17   Jani Gravel, MD  meclizine (ANTIVERT) 25 MG tablet Take 1 tablet (25 mg total) by mouth 3 (three) times daily as needed for dizziness. Patient not taking: Reported on 08/31/2017 08/05/17   Fredia Sorrow, MD  metoprolol succinate (TOPROL-XL) 25 MG 24 hr tablet Take 1 tablet (25 mg total) by mouth daily. Patient not taking: Reported on 03/21/2019 01/03/17 08/31/24  Murlean Iba, MD  polyethylene glycol (MIRALAX / GLYCOLAX) packet Take 17 g by mouth daily. Patient not taking: Reported on 03/07/2017 06/10/16   Modena Jansky, MD    Family History Family History  Problem Relation Age of Onset  . Heart attack Father   . Heart attack Brother     Social History Social History   Tobacco Use  . Smoking status: Never Smoker  . Smokeless tobacco: Never Used  Substance Use Topics  . Alcohol use: No  . Drug use: No     Allergies   Patient has no known allergies.   Review of Systems Review of Systems  Constitutional: Positive for diaphoresis and fatigue. Negative for chills and fever.  HENT: Negative for ear pain and sore throat.   Eyes: Negative for pain and visual disturbance.  Respiratory: Positive for shortness of breath. Negative for cough.   Cardiovascular: Negative for chest pain and palpitations.  Gastrointestinal: Positive for nausea. Negative for abdominal pain, blood in stool and vomiting.  Genitourinary: Negative for dysuria and hematuria.  Musculoskeletal: Negative for arthralgias and back pain.  Skin: Negative for color change and rash.  Neurological: Positive for dizziness and light-headedness. Negative for seizures and facial asymmetry.  All other systems reviewed and are negative.    Physical Exam Updated Vital Signs BP 137/88   Pulse 97   Temp 97.8 F (36.6 C) (Oral)   Resp 18   Ht 5\' 9"  (1.753 m)   Wt 93 kg    SpO2 97%   BMI 30.27 kg/m   Physical Exam Vitals signs and nursing note reviewed.  Constitutional:      General: He is in acute distress.     Appearance: He is well-developed. He is obese. He is ill-appearing and diaphoretic.  HENT:     Head: Normocephalic and atraumatic.  Eyes:     Conjunctiva/sclera: Conjunctivae normal.  Neck:     Musculoskeletal: Normal range of motion and neck supple.  Cardiovascular:     Rate and Rhythm: Normal rate and regular rhythm.     Pulses: Normal pulses.     Heart sounds: No murmur.  Pulmonary:     Effort: Pulmonary effort is normal. No respiratory distress.     Breath sounds: Normal breath sounds.  Chest:     Chest wall: No tenderness.  Abdominal:  General: There is no distension.     Palpations: Abdomen is soft.     Tenderness: There is no abdominal tenderness. There is no right CVA tenderness or left CVA tenderness.  Musculoskeletal: Normal range of motion.  Skin:    General: Skin is warm.     Capillary Refill: Capillary refill takes 2 to 3 seconds.  Neurological:     General: No focal deficit present.     Mental Status: He is alert and oriented to person, place, and time.     Cranial Nerves: No cranial nerve deficit.     Sensory: No sensory deficit.     Motor: No weakness.     Coordination: Coordination normal.     Deep Tendon Reflexes: Reflexes normal.  Psychiatric:        Mood and Affect: Mood normal.      ED Treatments / Results  Labs (all labs ordered are listed, but only abnormal results are displayed) Labs Reviewed  BASIC METABOLIC PANEL - Abnormal; Notable for the following components:      Result Value   Glucose, Bld 152 (*)    BUN 24 (*)    Creatinine, Ser 1.42 (*)    GFR calc non Af Amer 49 (*)    GFR calc Af Amer 57 (*)    All other components within normal limits  CBC - Abnormal; Notable for the following components:   Hemoglobin 12.9 (*)    MCV 76.3 (*)    MCH 23.2 (*)    RDW 16.2 (*)    All other  components within normal limits  URINALYSIS, ROUTINE W REFLEX MICROSCOPIC - Abnormal; Notable for the following components:   APPearance HAZY (*)    Hgb urine dipstick MODERATE (*)    Protein, ur 30 (*)    Nitrite POSITIVE (*)    Leukocytes,Ua LARGE (*)    RBC / HPF >50 (*)    WBC, UA >50 (*)    Bacteria, UA MANY (*)    All other components within normal limits  LACTIC ACID, PLASMA - Abnormal; Notable for the following components:   Lactic Acid, Venous 2.3 (*)    All other components within normal limits  CBG MONITORING, ED - Abnormal; Notable for the following components:   Glucose-Capillary 126 (*)    All other components within normal limits  SARS CORONAVIRUS 2 (TAT 6-24 HRS)  HEPATIC FUNCTION PANEL  LACTIC ACID, PLASMA  TSH  BASIC METABOLIC PANEL  CBC  HEMOGLOBIN A1C  TROPONIN I (HIGH SENSITIVITY)  TROPONIN I (HIGH SENSITIVITY)    EKG EKG Interpretation  Date/Time:  Friday March 21 2019 18:28:12 EDT Ventricular Rate:  70 PR Interval:    QRS Duration: 147 QT Interval:  393 QTC Calculation: 424 R Axis:   -61 Text Interpretation:  Sinus rhythm Prolonged PR interval Nonspecific IVCD with LAD LVH with secondary repolarization abnormality nO stemi.  Confirmed by Nanda Quinton [id number] on 03/21/2019 6:33:23 PM   Radiology Ct Angio Chest Pe W And/or Wo Contrast  Result Date: 03/21/2019 CLINICAL DATA:  Weakness, diaphoresis EXAM: CT ANGIOGRAPHY CHEST WITH CONTRAST TECHNIQUE: Multidetector CT imaging of the chest was performed using the standard protocol during bolus administration of intravenous contrast. Multiplanar CT image reconstructions and MIPs were obtained to evaluate the vascular anatomy. CONTRAST:  56mL OMNIPAQUE IOHEXOL 350 MG/ML SOLN COMPARISON:  July 25, 2017 FINDINGS: Cardiovascular: There is a optimal opacification of the pulmonary arteries. There is no central,segmental, or subsegmental filling defects within the pulmonary arteries.  There is mild  cardiomegaly. No pericardial effusion or thickening. No evidence right heart strain. There is four-vessel anatomy without proximal stenosis. There is an origin of the left vertebral artery off the aorta. Scattered aortic atherosclerotic calcifications are seen without aneurysmal dilatation. Mild ectasia of the ascending aorta measuring 4 cm. Mediastinum/Nodes: No hilar, mediastinal, or axillary adenopathy. Thyroid gland, trachea, and esophagus demonstrate no significant findings. Lungs/Pleura: Again noted is a tiny sub 5 mm pulmonary nodule seen adjacent to the pleura in the right middle lobe, series 6, image 62. Mild hazy ground-glass opacity seen in the posterior lower lungs, likely atelectasis. No large airspace consolidation. No pleural effusion or pneumothorax. No airspace consolidation. Upper Abdomen: No acute abnormalities present in the visualized portions of the upper abdomen. A low-density lesion seen off the upper pole of the left kidney likely renal cyst. Musculoskeletal: Interval decrease in appearance and size of the small sclerotic lesions no new acute osseous abnormality is noted. Review of the MIP images confirms the above findings. IMPRESSION: 1.  No central, segmental, or subsegmental pulmonary embolism. 2. No acute intrathoracic pathology to explain the patient's symptoms. 3. Slight interval decrease in size and appearance of the the multiple sclerotic lesions throughout the thoracic spine. Electronically Signed   By: Prudencio Pair M.D.   On: 03/21/2019 19:39   Dg Chest Portable 1 View  Result Date: 03/21/2019 CLINICAL DATA:  Shortness of breath EXAM: PORTABLE CHEST 1 VIEW COMPARISON:  07/25/2017 FINDINGS: Cardiomegaly. Both lungs are clear. The visualized skeletal structures are unremarkable. IMPRESSION: Cardiomegaly without acute abnormality of the lungs in AP portable projection. Electronically Signed   By: Eddie Candle M.D.   On: 03/21/2019 19:11    Procedures Procedures (including  critical care time)  Medications Ordered in ED Medications  enoxaparin (LOVENOX) injection 40 mg (has no administration in time range)  sodium chloride 0.9 % bolus 1,000 mL (has no administration in time range)  aspirin EC tablet 81 mg (81 mg Oral Given 03/21/19 2336)  amLODipine (NORVASC) tablet 5 mg (has no administration in time range)  atorvastatin (LIPITOR) tablet 80 mg (80 mg Oral Given 03/21/19 2337)  metoprolol tartrate (LOPRESSOR) tablet 25 mg (has no administration in time range)  tamsulosin (FLOMAX) capsule 0.4 mg (has no administration in time range)  iohexol (OMNIPAQUE) 350 MG/ML injection 75 mL (75 mLs Intravenous Contrast Given 03/21/19 1921)     Initial Impression / Assessment and Plan / ED Course  I have reviewed the triage vital signs and the nursing notes.  Pertinent labs & imaging results that were available during my care of the patient were reviewed by me and considered in my medical decision making (see chart for details).        Patient is a 73 year old male with history and physical exam as above presents emergency department for evaluation of fatigue, diaphoresis, and shortness of breath.  He was immediately brought back to the emergency department from the waiting room after he had a concerning episode of presyncope with diaphoresis.  Given patient's active cancer history emergent CTA PE study was done which demonstrated no findings concerning for PE.  Patient was placed on cardiac monitoring as well as frequent cycling of blood pressure cuff.  Vital signs remained stable.  Labs were obtained including CBC, metabolic panel, urinalysis.  Patient describes no abdominal pain or dysuria.  His creatinine is elevated at 1.4 from his baseline.  Due to patient's poor overall appearance on presentation in the emergency department although no ventricular  dysrhythmia or other emergent process was identified there is concerned that his presyncope could be of a more concerning  etiology particularly given his cancer history.  Patient also has an AKI.  Because of these things patient will be admitted to hospitalist service for further work-up and management.  Patient was seen in conjunction with my attending physician Dr. Laverta Baltimore.  Final Clinical Impressions(s) / ED Diagnoses   Final diagnoses:  Near syncope  AKI (acute kidney injury) [facility])    ED Discharge Orders    None       Romona Curls, MD 03/21/19 2358    Margette Fast, MD 03/22/19 [phone]

## 2019-03-21 NOTE — H&P (Signed)
History and Physical    John Shields DOB: 23-Oct-1945 DOA: 03/21/2019  PCP: [facility]  Patient coming from: Home  I have personally briefly reviewed patient's old medical records in [place]  Chief Complaint: Dizziness and shortness of breath  HPI: John Shields is a 73 y.o. 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  Patient reports that he was just sitting at home today when he suddenly felt dizzy and short of breath.  Thought that perhaps he could be having another stroke or another one of his hot flash episodes ever since he got his orchiectomy and decided to call EMS.  Dizziness lasted for 3 to 4 minutes but currently still feels lightheaded although improved.  Reports that for the past 2-week he feels that he has had to "think more when he goes down the stairs" because his head feels lightheaded.  No headaches, vision changes.  Notes generalized weakness. Also notes shortness of breath worse with exertion.  Reports that sometimes when he brushes his teeth he stops breathing and often has to think about breathing.  Has had new cough productive of sputum for the past 3 days.  Denies any fevers or chills.  He gets diaphoretic at time with exertion but states that is normal with his orchectomy.  Denies any orthopnea, paroxysmal nocturnal dyspnea.  No lower extremity edema. No chest pain or palpitations.  No nausea, vomiting or diarrhea.  Endorses good appetite although constantly feels thirsty ever since he received his bilateral orchiectomy surgery about a year ago. Denies any tobacco or illicit drug use.  Rare alcohol use.   ED Course: He was afebrile and normotensive on room air.  CBC showed mild anemia of 12.9 but no leukocytosis.  BMP showed creatinine of  1.42 elevated from a prior of 0.97 a year ago.  Lactic of 2.3 and down to 1.3.  Troponin flat at 4.  EKG shows sinus rhythm with intraventricular delay. TSH normal.  CTA chest showed no central, segmental or subsegmental pulmonary embolism.  Review of Systems:  Constitutional: No Weight Change, No Fever ENT/Mouth: No sore throat, No Rhinorrhea Eyes: No Eye Pain, No Vision Changes Cardiovascular: No Chest Pain, no SOB, No PND, + Dyspnea on Exertion, No Orthopnea, No Claudication, No Edema, No Palpitations Respiratory: No Cough, No Sputum, No Wheezing, no Dyspnea  Gastrointestinal: No Nausea, No Vomiting, No Diarrhea, No Constipation, No Pain Genitourinary: no Urinary Incontinence, No Urgency, No Flank Pain Musculoskeletal: No Arthralgias, No Myalgias Skin: No Skin Lesions, No Pruritus, Neuro: +Weakness, No Numbness,  No Loss of Consciousness, No Syncope Psych: No Anxiety/Panic, No Depression, no decrease appetite Heme/Lymph: No Bruising, No Bleeding  Past Medical History:  Diagnosis Date  . Foley catheter in place   . History of sepsis 12/27/2016   sepsis from prostate abscess post prostate bx  . Hypertension   . LBBB (left bundle branch block)   . Prostate cancer, primary, 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 [facility]) urologist-  dr Tresa Moore   dx 07/ 2018 bx-- Gleason 8, PSA 400, vol 370ml-- abd. lymphadenopathy  . Renal cancer, right ([place])   . Thrombocytopenia ([place])   . Urine retention    intermittant since 2016    Past Surgical History:  Procedure Laterality Date  . NO PAST SURGERIES    . ORCHIECTOMY Bilateral 01/31/2017  Procedure: ORCHIECTOMY;  Surgeon: Alexis Frock, MD;  Location: [facility];  Service: Urology;  Laterality: Bilateral;     reports that he has never smoked. He has never used smokeless tobacco. He reports that he does not drink alcohol or use drugs.  No Known Allergies  Family History  Problem Relation Age of Onset  . Heart attack  Father   . Heart attack Brother    Family history reviewed and not pertinent   Prior to Admission medications   Medication Sig Start Date End Date Taking? Authorizing Provider  amLODipine (NORVASC) 5 MG tablet Take 1 tablet (5 mg total) by mouth daily. 01/03/17 09/01/23 Yes Johnson, Clanford L, MD  aspirin EC 81 MG tablet Take 81 mg by mouth 2 (two) times daily.   Yes [provider]  atorvastatin (LIPITOR) 80 MG tablet Take 1 tablet (80 mg total) by mouth daily at 6 PM. Patient taking differently: Take 80 mg by mouth at bedtime.  09/03/17  Yes Jani Gravel, MD  b complex vitamins tablet Take 1 tablet by mouth daily.   Yes [provider]  Cholecalciferol (VITAMIN D3 PO) Take 1 tablet by mouth daily.   Yes [provider]  lisinopril (PRINIVIL,ZESTRIL) 20 MG tablet Take 1 tablet (20 mg total) by mouth daily. 01/03/17 08/31/24 Yes Johnson, Clanford L, MD  metoprolol tartrate (LOPRESSOR) 25 MG tablet Take 25 mg by mouth daily.   Yes [provider]  tamsulosin (FLOMAX) 0.4 MG CAPS capsule Take 0.4 mg by mouth daily.    Yes [provider]  clopidogrel (PLAVIX) 75 MG tablet Take 1 tablet (75 mg total) by mouth daily. Patient not taking: Reported on 03/21/2019 09/04/17   Jani Gravel, MD  meclizine (ANTIVERT) 25 MG tablet Take 1 tablet (25 mg total) by mouth 3 (three) times daily as needed for dizziness. Patient not taking: Reported on 08/31/2017 08/05/17   Fredia Sorrow, MD  metoprolol succinate (TOPROL-XL) 25 MG 24 hr tablet Take 1 tablet (25 mg total) by mouth daily. Patient not taking: Reported on 03/21/2019 01/03/17 08/31/24  Murlean Iba, MD  polyethylene glycol (MIRALAX / GLYCOLAX) packet Take 17 g by mouth daily. Patient not taking: Reported on 03/07/2017 06/10/16   Modena Jansky, MD    Physical Exam: Vitals:   03/21/19 2100 03/21/19 2130 03/21/19 2145 03/21/19 2200  BP: 133/60 122/70 129/74 (!) 111/92  Pulse:  86 85   Resp: (!) 23 (!) 23 (!) 21  (!) 23  Temp:      TempSrc:      SpO2:  94% 97%   Weight:      Height:        Constitutional: NAD, calm, comfortable, well-appearing male sitting up on commode. Vitals:   03/21/19 2100 03/21/19 2130 03/21/19 2145 03/21/19 2200  BP: 133/60 122/70 129/74 (!) 111/92  Pulse:  86 85   Resp: (!) 23 (!) 23 (!) 21 (!) 23  Temp:      TempSrc:      SpO2:  94% 97%   Weight:      Height:       Eyes: PERRL, lids and conjunctivae normal, wears glasses ENMT: Mucous membranes are moist. Posterior pharynx clear of any exudate or lesions.Normal dentition.  Neck: normal, supple, no masses Respiratory: clear to auscultation bilaterally, no wheezing, no crackles. Normal respiratory effort on room air. No accessory muscle use.  Cardiovascular: Regular rate and rhythm, no murmurs / rubs / gallops. No extremity edema. 2+  pedal pulses. No carotid bruits.  Abdomen: no tenderness, no masses palpated. Bowel sounds positive.  Musculoskeletal: no clubbing / cyanosis. No joint deformity upper and lower extremities. Good ROM, no contractures. Normal muscle tone.  Skin: no rashes, lesions, ulcers. No induration Neurologic: CN 2-12 grossly intact. Sensation intact.Strength 5/5 in all 4.  Psychiatric: Normal judgment and insight. Alert and oriented x 3. Normal mood.     Labs on Admission: I have personally reviewed following labs and imaging studies  CBC: Recent Labs  Lab 03/21/19 1430  WBC 6.5  HGB 12.9*  HCT 42.4  MCV 76.3*  PLT 99991111   Basic Metabolic Panel: Recent Labs  Lab 03/21/19 1430  NA 139  K 4.9  CL 105  CO2 25  GLUCOSE 152*  BUN 24*  CREATININE 1.42*  CALCIUM 9.3   GFR: Estimated Creatinine Clearance: 52.9 mL/min (A) (by C-G formula based on SCr of 1.42 mg/dL (H)). Liver Function Tests: Recent Labs  Lab 03/21/19 1829  AST 29  ALT 41  ALKPHOS 60  BILITOT 0.5  PROT 7.5  ALBUMIN 4.0   No results for input(s): LIPASE, AMYLASE in the last 168 hours. No results for input(s):  AMMONIA in the last 168 hours. Coagulation Profile: No results for input(s): INR, PROTIME in the last 168 hours. Cardiac Enzymes: No results for input(s): CKTOTAL, CKMB, CKMBINDEX, TROPONINI in the last 168 hours. BNP (last 3 results) No results for input(s): PROBNP in the last 8760 hours. HbA1C: No results for input(s): HGBA1C in the last 72 hours. CBG: Recent Labs  Lab 03/21/19 1819  GLUCAP 126*   Lipid Profile: No results for input(s): CHOL, HDL, LDLCALC, TRIG, CHOLHDL, LDLDIRECT in the last 72 hours. Thyroid Function Tests: Recent Labs    03/21/19 1901  TSH 0.985   Anemia Panel: No results for input(s): VITAMINB12, FOLATE, FERRITIN, TIBC, IRON, RETICCTPCT in the last 72 hours. Urine analysis:    Component Value Date/Time   COLORURINE YELLOW 03/21/2019 2030   APPEARANCEUR HAZY (A) 03/21/2019 2030   LABSPEC 1.029 03/21/2019 2030   [place] 5.0 03/21/2019 2030   GLUCOSEU NEGATIVE 03/21/2019 2030   HGBUR MODERATE (A) 03/21/2019 2030   [place] NEGATIVE 03/21/2019 2030   [place] 03/21/2019 2030   PROTEINUR 30 (A) 03/21/2019 2030   UROBILINOGEN 0.2 11/30/2014 1942   NITRITE POSITIVE (A) 03/21/2019 2030   LEUKOCYTESUR LARGE (A) 03/21/2019 2030    Radiological Exams on Admission: Ct Angio Chest Pe W And/or Wo Contrast  Result Date: 03/21/2019 CLINICAL DATA:  Weakness, diaphoresis EXAM: CT ANGIOGRAPHY CHEST WITH CONTRAST TECHNIQUE: Multidetector CT imaging of the chest was performed using the standard protocol during bolus administration of intravenous contrast. Multiplanar CT image reconstructions and MIPs were obtained to evaluate the vascular anatomy. CONTRAST:  63mL OMNIPAQUE IOHEXOL 350 MG/ML SOLN COMPARISON:  July 25, 2017 FINDINGS: Cardiovascular: There is a optimal opacification of the pulmonary arteries. There is no central,segmental, or subsegmental filling defects within the pulmonary arteries. There is mild cardiomegaly. No pericardial effusion or  thickening. No evidence right heart strain. There is four-vessel anatomy without proximal stenosis. There is an origin of the left vertebral artery off the aorta. Scattered aortic atherosclerotic calcifications are seen without aneurysmal dilatation. Mild ectasia of the ascending aorta measuring 4 cm. Mediastinum/Nodes: No hilar, mediastinal, or axillary adenopathy. Thyroid gland, trachea, and esophagus demonstrate no significant findings. Lungs/Pleura: Again noted is a tiny sub 5 mm pulmonary nodule seen adjacent to the pleura in the right middle lobe, series 6, image  62. Mild hazy ground-glass opacity seen in the posterior lower lungs, likely atelectasis. No large airspace consolidation. No pleural effusion or pneumothorax. No airspace consolidation. Upper Abdomen: No acute abnormalities present in the visualized portions of the upper abdomen. A low-density lesion seen off the upper pole of the left kidney likely renal cyst. Musculoskeletal: Interval decrease in appearance and size of the small sclerotic lesions no new acute osseous abnormality is noted. Review of the MIP images confirms the above findings. IMPRESSION: 1.  No central, segmental, or subsegmental pulmonary embolism. 2. No acute intrathoracic pathology to explain the patient's symptoms. 3. Slight interval decrease in size and appearance of the the multiple sclerotic lesions throughout the thoracic spine. Electronically Signed   By: Prudencio Pair M.D.   On: 03/21/2019 19:39   Dg Chest Portable 1 View  Result Date: 03/21/2019 CLINICAL DATA:  Shortness of breath EXAM: PORTABLE CHEST 1 VIEW COMPARISON:  07/25/2017 FINDINGS: Cardiomegaly. Both lungs are clear. The visualized skeletal structures are unremarkable. IMPRESSION: Cardiomegaly without acute abnormality of the lungs in AP portable projection. Electronically Signed   By: Eddie Candle M.D.   On: 03/21/2019 19:11    EKG: Independently reviewed.  Assessment/Plan  Dizziness - unclear  etiology although pt endorse "hot flash" episodes ever since his bilateral orchiectomy last year and would get dizzy and somewhat short of breath.  -TSH normal - had CT head in April with patent carotid and vertebral arteries - Has AKI and elevated lactate so could be due to hypovolemia. Also notes increase thirst since orchiectomy - check HbA1C -Will check orthostatic vital signs - monitor on telemetry  - will give 1L NS bolus  Dyspnea on exertion -Patient is stable on room air. -CTA chest did not show PE or any acute finding -will keep on monitoring. Could be due to anxiety?  AKI -creatinine of 1.42 from a prior of 0.97 - will give 1L bolus NS -Avoid nephrotoxic agent -Follow BMP  Hx of CVA  - no focal neurological findings on exam today - continue ASA and Lipitor   Prostate cancer s/p bilateral orchiectomy, right renal mass - follows with primary outpatient at [facility] - continue Flomax   R mid ICA saccular aneurysm  -measuring up to 36mm in CT head in April/2020 - continue follow up outpt  Hypertension -continue amlodipine, Metoprolol - hold Lisinopril due to AKI  Hyperlipidemia - continue Lipitor    Annalina Needles T Saben Donigan DO [facility]   If 7PM-7AM, please contact night-coverage [email or web address] Password [facility]  03/21/2019, 10:42 PM

## 2019-03-21 NOTE — ED Triage Notes (Signed)
Pt BIB [facility] for eval of episode of weakness and diaphoresis. Pt was sitting on porch and his neighbor noted that he was weak and slow to respond. EMS reports he was weak on standing, pt reports he is feeling improved, but still weak and dizzy.

## 2019-03-21 NOTE — ED Notes (Signed)
Date and time results received: 03/21/19 8:14 PM Test: Lactic Critical Value: 2.3 Name of Provider Notified:  Dr. Laverta Baltimore  Orders Received? Or Actions Taken?: Repeat lactic

## 2019-03-22 ENCOUNTER — Other Ambulatory Visit: Payer: Self-pay

## 2019-03-22 DIAGNOSIS — N179 Acute kidney failure, unspecified: Secondary | ICD-10-CM | POA: Diagnosis not present

## 2019-03-22 LAB — BASIC METABOLIC PANEL
Anion gap: 9 (ref 5–15)
BUN: 18 mg/dL (ref 8–23)
CO2: 22 mmol/L (ref 22–32)
Calcium: 8.9 mg/dL (ref 8.9–10.3)
Chloride: 108 mmol/L (ref 98–111)
Creatinine, Ser: 1.1 mg/dL (ref 0.61–1.24)
GFR calc Af Amer: >60 mL/min (ref >60)
GFR calc non Af Amer: >60 mL/min (ref >60)
Glucose, Bld: 92 mg/dL (ref 70–99)
Potassium: 3.8 mmol/L (ref 3.5–5.1)
Sodium: 139 mmol/L (ref 135–145)

## 2019-03-22 LAB — CBC
HCT: 37.6 % — ABNORMAL LOW (ref 39.0–52.0)
Hemoglobin: 11.9 g/dL — ABNORMAL LOW (ref 13.0–17.0)
MCH: 23.5 pg — ABNORMAL LOW (ref 26.0–34.0)
MCHC: 31.6 g/dL (ref 30.0–36.0)
MCV: 74.2 fL — ABNORMAL LOW (ref 80.0–100.0)
Platelets: 184 10*3/uL (ref 150–400)
RBC: 5.07 MIL/uL (ref 4.22–5.81)
RDW: 15.2 % (ref 11.5–15.5)
WBC: 7.8 10*3/uL (ref 4.0–10.5)
nRBC: 0 % (ref 0.0–0.2)

## 2019-03-22 LAB — HEMOGLOBIN A1C
Hgb A1c MFr Bld: 6.5 % — ABNORMAL HIGH (ref 4.8–5.6)
Mean Plasma Glucose: 139.85 mg/dL

## 2019-03-22 LAB — SARS CORONAVIRUS 2 (TAT 6-24 HRS): SARS Coronavirus 2: NEGATIVE

## 2019-03-22 MED ORDER — METFORMIN HCL 500 MG PO TABS: 500.0000 mg | ORAL_TABLET | Freq: Every day | ORAL | 0 refills | Status: DC

## 2019-03-22 NOTE — Progress Notes (Signed)
Pt PIVs removed. Off tele monitor. Discharge packet explained including when to take next medications. Patient awaiting ride to pick him up. Pt belongings (glassess, robe and phone) are with patient to go home.  Dewaine Oats, RN

## 2019-03-22 NOTE — Discharge Summary (Signed)
Physician Discharge Summary  John Shields [id number] DOB: [birth year] DOA: 03/21/2019  PCP: [facility]  Admit date: 03/21/2019 Discharge date: 03/22/2019  Admitted From: home Discharge disposition: home   Recommendations for Outpatient Follow-Up:   1. HgbA1c: 6.5: metformin started-- continue education   Discharge Diagnosis:   Active Problems:   Dizziness    Discharge Condition: Improved.  Diet recommendation: Low sodium, heart healthy.  Carbohydrate-modified  Wound care: None.  Code status: Full.   History of Present Illness:  John Shields is a 73 y.o. 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  Patient reports that he was just sitting at home today when he suddenly felt dizzy and short of breath.  Thought that perhaps he could be having another stroke or another one of his hot flash episodes ever since he got his orchiectomy and decided to call EMS.  Dizziness lasted for 3 to 4 minutes but currently still feels lightheaded although improved.  Reports that for the past 2-week he feels that he has had to "think more when he goes down the stairs" because his head feels lightheaded.  No headaches, vision changes.  Notes generalized weakness. Also notes shortness of breath worse with exertion.  Reports that sometimes when he brushes his teeth he stops breathing and often has to think about breathing.  Has had new cough productive of sputum for the past 3 days.  Denies any fevers or chills.  He gets diaphoretic at time with exertion but states that is normal with his orchectomy.  Denies any orthopnea, paroxysmal nocturnal dyspnea.  No lower extremity edema. No chest pain or palpitations.  No nausea, vomiting or diarrhea.  Endorses good appetite although  constantly feels thirsty ever since he received his bilateral orchiectomy surgery about a year ago. Denies any tobacco or illicit drug use.  Rare alcohol use.   Hospital Course by Problem:   Dizziness - unclear etiology although pt endorse "hot flash" episodes ever since his bilateral orchiectomy last year and would get dizzy and somewhat short of breath.  -TSH normal - had CT head in April with patent carotid and vertebral arteries -CTA for PE negative - Has AKI and elevated lactate so could be due to hypovolemia. Also notes increase thirst since orchiectomy   Elevated HgbA1c: 6.5 -start metformin  AKI -creatinine of 1.42 from a prior of 0.97 - resolved with IVF  Hx of CVA  - no focal neurological findings on exam today - continue ASA and Lipitor   Prostate cancer s/p bilateral orchiectomy, right renal mass - follows with primary outpatient at [facility] - continue Flomax   R mid ICA saccular aneurysm  -measuring up to 21mm in CT head in April/2020 - continue follow up outpt  Hypertension -resume home meds  Hyperlipidemia - continue Lipitor     Medical Consultants:      Discharge Exam:   Vitals:   03/22/19 0007 03/22/19 0016  BP: (!) 164/108   Pulse: 99   Resp: 17   Temp: 98.2 F (36.8 C)   SpO2: 99% 97%   Vitals:   03/21/19 2200 03/21/19 2300 03/22/19 0007 03/22/19 0016  BP: (!) 111/92 137/88 (!) 164/108   Pulse:  97 99   Resp: (!) 23 18 17    Temp:   98.2 F (36.8 C)   TempSrc:  Oral   SpO2:  97% 99% 97%  Weight:    95.3 kg  Height:    5\' 9"  (1.753 m)     The results of significant diagnostics from this hospitalization (including imaging, microbiology, ancillary and laboratory) are listed below for reference.     Procedures and Diagnostic Studies:   Ct Angio Chest Pe W And/or Wo Contrast  Result Date: 03/21/2019 CLINICAL DATA:  Weakness, diaphoresis EXAM: CT ANGIOGRAPHY CHEST WITH CONTRAST TECHNIQUE: Multidetector CT imaging of the  chest was performed using the standard protocol during bolus administration of intravenous contrast. Multiplanar CT image reconstructions and MIPs were obtained to evaluate the vascular anatomy. CONTRAST:  77mL OMNIPAQUE IOHEXOL 350 MG/ML SOLN COMPARISON:  July 25, 2017 FINDINGS: Cardiovascular: There is a optimal opacification of the pulmonary arteries. There is no central,segmental, or subsegmental filling defects within the pulmonary arteries. There is mild cardiomegaly. No pericardial effusion or thickening. No evidence right heart strain. There is four-vessel anatomy without proximal stenosis. There is an origin of the left vertebral artery off the aorta. Scattered aortic atherosclerotic calcifications are seen without aneurysmal dilatation. Mild ectasia of the ascending aorta measuring 4 cm. Mediastinum/Nodes: No hilar, mediastinal, or axillary adenopathy. Thyroid gland, trachea, and esophagus demonstrate no significant findings. Lungs/Pleura: Again noted is a tiny sub 5 mm pulmonary nodule seen adjacent to the pleura in the right middle lobe, series 6, image 62. Mild hazy ground-glass opacity seen in the posterior lower lungs, likely atelectasis. No large airspace consolidation. No pleural effusion or pneumothorax. No airspace consolidation. Upper Abdomen: No acute abnormalities present in the visualized portions of the upper abdomen. A low-density lesion seen off the upper pole of the left kidney likely renal cyst. Musculoskeletal: Interval decrease in appearance and size of the small sclerotic lesions no new acute osseous abnormality is noted. Review of the MIP images confirms the above findings. IMPRESSION: 1.  No central, segmental, or subsegmental pulmonary embolism. 2. No acute intrathoracic pathology to explain the patient's symptoms. 3. Slight interval decrease in size and appearance of the the multiple sclerotic lesions throughout the thoracic spine. Electronically Signed   By: Prudencio Pair M.D.    On: 03/21/2019 19:39   Dg Chest Portable 1 View  Result Date: 03/21/2019 CLINICAL DATA:  Shortness of breath EXAM: PORTABLE CHEST 1 VIEW COMPARISON:  07/25/2017 FINDINGS: Cardiomegaly. Both lungs are clear. The visualized skeletal structures are unremarkable. IMPRESSION: Cardiomegaly without acute abnormality of the lungs in AP portable projection. Electronically Signed   By: Eddie Candle M.D.   On: 03/21/2019 19:11     Labs:   Basic Metabolic Panel: Recent Labs  Lab 03/21/19 1430 03/22/19 0601  NA 139 139  K 4.9 3.8  CL 105 108  CO2 25 22  GLUCOSE 152* 92  BUN 24* 18  CREATININE 1.42* 1.10  CALCIUM 9.3 8.9   GFR Estimated Creatinine Clearance: 69.1 mL/min (by C-G formula based on SCr of 1.1 mg/dL). Liver Function Tests: Recent Labs  Lab 03/21/19 1829  AST 29  ALT 41  ALKPHOS 60  BILITOT 0.5  PROT 7.5  ALBUMIN 4.0   No results for input(s): LIPASE, AMYLASE in the last 168 hours. No results for input(s): AMMONIA in the last 168 hours. Coagulation profile No results for input(s): INR, PROTIME in the last 168 hours.  CBC: Recent Labs  Lab 03/21/19 1430 03/22/19 0601  WBC 6.5 7.8  HGB 12.9* 11.9*  HCT 42.4 37.6*  MCV 76.3* 74.2*  PLT 218 184  Cardiac Enzymes: No results for input(s): CKTOTAL, CKMB, CKMBINDEX, TROPONINI in the last 168 hours. BNP: Invalid input(s): POCBNP CBG: Recent Labs  Lab 03/21/19 1819  GLUCAP 126*   D-Dimer No results for input(s): DDIMER in the last 72 hours. Hgb A1c Recent Labs    03/22/19 0601  HGBA1C 6.5*   Lipid Profile No results for input(s): CHOL, HDL, LDLCALC, TRIG, CHOLHDL, LDLDIRECT in the last 72 hours. Thyroid function studies Recent Labs    03/21/19 1901  TSH 0.985   Anemia work up No results for input(s): VITAMINB12, FOLATE, FERRITIN, TIBC, IRON, RETICCTPCT in the last 72 hours. Microbiology Recent Results (from the past 240 hour(s))  SARS CORONAVIRUS 2 (TAT 6-24 HRS) Nasopharyngeal Nasopharyngeal  Swab     Status: None   Collection Time: 03/21/19  9:24 PM   Specimen: Nasopharyngeal Swab  Result Value Ref Range Status   SARS Coronavirus 2 NEGATIVE NEGATIVE Final    Comment: (NOTE) SARS-CoV-2 target nucleic acids are NOT DETECTED. The SARS-CoV-2 RNA is generally detectable in upper and lower respiratory specimens during the acute phase of infection. Negative results do not preclude SARS-CoV-2 infection, do not rule out co-infections with other pathogens, and should not be used as the sole basis for treatment or other patient management decisions. Negative results must be combined with clinical observations, patient history, and epidemiological information. The expected result is Negative. Fact Sheet for Patients: SugarRoll.be Fact Sheet for Healthcare Providers: https://www.woods-mathews.com/ This test is not yet approved or cleared by the [place] FDA and  has been authorized for detection and/or diagnosis of SARS-CoV-2 by FDA under an Emergency Use Authorization (EUA). This EUA will remain  in effect (meaning this test can be used) for the duration of the COVID-19 declaration under Section 56 4(b)(1) of the Act, 21 U.S.C. section 360bbb-3(b)(1), unless the authorization is terminated or revoked sooner. Performed at [facility], [place] [address], [place], [place] [address]      Discharge Instructions:   Discharge Instructions    Diet - low sodium heart healthy   Complete by: As directed    Diet Carb Modified   Complete by: As directed    Discharge instructions   Complete by: As directed    Monitor your carbohydrate intake-- avoid soda, white rice and white bread Close follow up with your PCP at the [facility]   Increase activity slowly   Complete by: As directed      Allergies as of 03/22/2019   No Known Allergies     Medication List    STOP taking these medications   clopidogrel 75 MG tablet Commonly known as:  PLAVIX   meclizine 25 MG tablet Commonly known as: ANTIVERT   metoprolol succinate 25 MG 24 hr tablet Commonly known as: TOPROL-XL   polyethylene glycol 17 g packet Commonly known as: MIRALAX / GLYCOLAX     TAKE these medications   amLODipine 5 MG tablet Commonly known as: NORVASC Take 1 tablet (5 mg total) by mouth daily.   aspirin EC 81 MG tablet Take 81 mg by mouth 2 (two) times daily.   atorvastatin 80 MG tablet Commonly known as: LIPITOR Take 1 tablet (80 mg total) by mouth daily at 6 PM. What changed: when to take this   b complex vitamins tablet Take 1 tablet by mouth daily.   lisinopril 20 MG tablet Commonly known as: ZESTRIL Take 1 tablet (20 mg total) by mouth daily.   metFORMIN 500 MG tablet Commonly known as: Glucophage  Take 1 tablet (500 mg total) by mouth daily with breakfast.   metoprolol tartrate 25 MG tablet Commonly known as: LOPRESSOR Take 25 mg by mouth daily.   tamsulosin 0.4 MG Caps capsule Commonly known as: FLOMAX Take 0.4 mg by mouth daily.   VITAMIN D3 PO Take 1 tablet by mouth daily.         Time coordinating discharge: 25 min  Signed:  Geradine Girt DO  [facility] 03/22/2019, 10:32 AM

## 2019-04-03 ENCOUNTER — Other Ambulatory Visit: Payer: Self-pay | Admitting: Urology

## 2019-04-03 DIAGNOSIS — C7951 Secondary malignant neoplasm of bone: Secondary | ICD-10-CM

## 2019-04-03 DIAGNOSIS — M8580 Other specified disorders of bone density and structure, unspecified site: Secondary | ICD-10-CM

## 2019-04-03 DIAGNOSIS — C61 Malignant neoplasm of prostate: Secondary | ICD-10-CM | POA: Diagnosis not present

## 2019-04-03 DIAGNOSIS — C641 Malignant neoplasm of right kidney, except renal pelvis: Secondary | ICD-10-CM | POA: Diagnosis not present

## 2019-04-03 DIAGNOSIS — C775 Secondary and unspecified malignant neoplasm of intrapelvic lymph nodes: Secondary | ICD-10-CM | POA: Diagnosis not present

## 2019-04-04 ENCOUNTER — Emergency Department (HOSPITAL_COMMUNITY): Payer: No Typology Code available for payment source

## 2019-04-04 ENCOUNTER — Other Ambulatory Visit: Payer: Self-pay

## 2019-04-04 ENCOUNTER — Encounter (HOSPITAL_COMMUNITY): Payer: Self-pay | Admitting: Emergency Medicine

## 2019-04-04 ENCOUNTER — Inpatient Hospital Stay (HOSPITAL_COMMUNITY)
Admission: EM | Admit: 2019-04-04 | Discharge: 2019-04-07 | DRG: 176 | Disposition: A | Discharge status: Home Health | Payer: No Typology Code available for payment source | Attending: Family Medicine | Admitting: Family Medicine

## 2019-04-04 DIAGNOSIS — Y92410 Unspecified street and highway as the place of occurrence of the external cause: Secondary | ICD-10-CM

## 2019-04-04 DIAGNOSIS — I10 Essential (primary) hypertension: Secondary | ICD-10-CM | POA: Diagnosis present

## 2019-04-04 DIAGNOSIS — Z7982 Long term (current) use of aspirin: Secondary | ICD-10-CM

## 2019-04-04 DIAGNOSIS — S8992XA Unspecified injury of left lower leg, initial encounter: Secondary | ICD-10-CM

## 2019-04-04 DIAGNOSIS — I2699 Other pulmonary embolism without acute cor pulmonale: Secondary | ICD-10-CM | POA: Diagnosis not present

## 2019-04-04 DIAGNOSIS — S3991XA Unspecified injury of abdomen, initial encounter: Secondary | ICD-10-CM

## 2019-04-04 DIAGNOSIS — Z8249 Family history of ischemic heart disease and other diseases of the circulatory system: Secondary | ICD-10-CM

## 2019-04-04 DIAGNOSIS — S8012XA Contusion of left lower leg, initial encounter: Secondary | ICD-10-CM | POA: Diagnosis present

## 2019-04-04 DIAGNOSIS — Z8673 Personal history of transient ischemic attack (TIA), and cerebral infarction without residual deficits: Secondary | ICD-10-CM

## 2019-04-04 DIAGNOSIS — N390 Urinary tract infection, site not specified: Secondary | ICD-10-CM | POA: Diagnosis present

## 2019-04-04 DIAGNOSIS — I959 Hypotension, unspecified: Secondary | ICD-10-CM | POA: Diagnosis not present

## 2019-04-04 DIAGNOSIS — S80912A Unspecified superficial injury of left knee, initial encounter: Secondary | ICD-10-CM | POA: Diagnosis not present

## 2019-04-04 DIAGNOSIS — C641 Malignant neoplasm of right kidney, except renal pelvis: Secondary | ICD-10-CM | POA: Diagnosis present

## 2019-04-04 DIAGNOSIS — C7951 Secondary malignant neoplasm of bone: Secondary | ICD-10-CM | POA: Diagnosis present

## 2019-04-04 DIAGNOSIS — E785 Hyperlipidemia, unspecified: Secondary | ICD-10-CM | POA: Diagnosis present

## 2019-04-04 DIAGNOSIS — Z79899 Other long term (current) drug therapy: Secondary | ICD-10-CM

## 2019-04-04 DIAGNOSIS — N2889 Other specified disorders of kidney and ureter: Secondary | ICD-10-CM | POA: Diagnosis present

## 2019-04-04 DIAGNOSIS — C61 Malignant neoplasm of prostate: Secondary | ICD-10-CM | POA: Diagnosis present

## 2019-04-04 DIAGNOSIS — E119 Type 2 diabetes mellitus without complications: Secondary | ICD-10-CM | POA: Diagnosis present

## 2019-04-04 DIAGNOSIS — E875 Hyperkalemia: Secondary | ICD-10-CM | POA: Diagnosis not present

## 2019-04-04 DIAGNOSIS — Z20828 Contact with and (suspected) exposure to other viral communicable diseases: Secondary | ICD-10-CM | POA: Diagnosis present

## 2019-04-04 DIAGNOSIS — Z03818 Encounter for observation for suspected exposure to other biological agents ruled out: Secondary | ICD-10-CM | POA: Diagnosis not present

## 2019-04-04 DIAGNOSIS — B9620 Unspecified Escherichia coli [E. coli] as the cause of diseases classified elsewhere: Secondary | ICD-10-CM | POA: Diagnosis present

## 2019-04-04 DIAGNOSIS — S8002XA Contusion of left knee, initial encounter: Secondary | ICD-10-CM | POA: Diagnosis present

## 2019-04-04 DIAGNOSIS — R310 Gross hematuria: Secondary | ICD-10-CM | POA: Diagnosis present

## 2019-04-04 DIAGNOSIS — Z7984 Long term (current) use of oral hypoglycemic drugs: Secondary | ICD-10-CM

## 2019-04-04 DIAGNOSIS — N179 Acute kidney failure, unspecified: Secondary | ICD-10-CM | POA: Diagnosis present

## 2019-04-04 HISTORY — DX: Cerebral infarction, unspecified: I63.9

## 2019-04-04 HISTORY — DX: Type 2 diabetes mellitus without complications: E11.9

## 2019-04-04 HISTORY — DX: Acute myocardial infarction, unspecified: I21.9

## 2019-04-04 HISTORY — DX: Headache, unspecified: R51.9

## 2019-04-04 HISTORY — DX: Prediabetes: R73.03

## 2019-04-04 MED ORDER — OXYCODONE-ACETAMINOPHEN 5-325 MG PO TABS
1.0000 | ORAL_TABLET | Freq: Once | ORAL | Status: AC
  Administered 2019-04-05: 1 via ORAL
  Filled 2019-04-04: qty 1
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
## 2019-04-05 ENCOUNTER — Emergency Department (HOSPITAL_COMMUNITY): Payer: No Typology Code available for payment source

## 2019-04-05 ENCOUNTER — Encounter (HOSPITAL_COMMUNITY): Payer: Self-pay | Admitting: Family Medicine

## 2019-04-05 ENCOUNTER — Inpatient Hospital Stay (HOSPITAL_COMMUNITY): Payer: No Typology Code available for payment source

## 2019-04-05 DIAGNOSIS — N3289 Other specified disorders of bladder: Secondary | ICD-10-CM | POA: Diagnosis not present

## 2019-04-05 DIAGNOSIS — Z79899 Other long term (current) drug therapy: Secondary | ICD-10-CM | POA: Diagnosis not present

## 2019-04-05 DIAGNOSIS — R310 Gross hematuria: Secondary | ICD-10-CM | POA: Diagnosis present

## 2019-04-05 DIAGNOSIS — Z20828 Contact with and (suspected) exposure to other viral communicable diseases: Secondary | ICD-10-CM | POA: Diagnosis present

## 2019-04-05 DIAGNOSIS — S8002XA Contusion of left knee, initial encounter: Secondary | ICD-10-CM | POA: Diagnosis present

## 2019-04-05 DIAGNOSIS — I2699 Other pulmonary embolism without acute cor pulmonale: Principal | ICD-10-CM

## 2019-04-05 DIAGNOSIS — I10 Essential (primary) hypertension: Secondary | ICD-10-CM | POA: Diagnosis not present

## 2019-04-05 DIAGNOSIS — E785 Hyperlipidemia, unspecified: Secondary | ICD-10-CM | POA: Diagnosis present

## 2019-04-05 DIAGNOSIS — Z8673 Personal history of transient ischemic attack (TIA), and cerebral infarction without residual deficits: Secondary | ICD-10-CM | POA: Diagnosis not present

## 2019-04-05 DIAGNOSIS — S8012XA Contusion of left lower leg, initial encounter: Secondary | ICD-10-CM | POA: Diagnosis not present

## 2019-04-05 DIAGNOSIS — C61 Malignant neoplasm of prostate: Secondary | ICD-10-CM

## 2019-04-05 DIAGNOSIS — S3722XA Contusion of bladder, initial encounter: Secondary | ICD-10-CM | POA: Diagnosis not present

## 2019-04-05 DIAGNOSIS — N2889 Other specified disorders of kidney and ureter: Secondary | ICD-10-CM | POA: Diagnosis not present

## 2019-04-05 DIAGNOSIS — N179 Acute kidney failure, unspecified: Secondary | ICD-10-CM | POA: Diagnosis present

## 2019-04-05 DIAGNOSIS — N390 Urinary tract infection, site not specified: Secondary | ICD-10-CM | POA: Diagnosis present

## 2019-04-05 DIAGNOSIS — Z8249 Family history of ischemic heart disease and other diseases of the circulatory system: Secondary | ICD-10-CM | POA: Diagnosis not present

## 2019-04-05 DIAGNOSIS — C641 Malignant neoplasm of right kidney, except renal pelvis: Secondary | ICD-10-CM | POA: Diagnosis present

## 2019-04-05 DIAGNOSIS — I959 Hypotension, unspecified: Secondary | ICD-10-CM | POA: Diagnosis not present

## 2019-04-05 DIAGNOSIS — Z7984 Long term (current) use of oral hypoglycemic drugs: Secondary | ICD-10-CM | POA: Diagnosis not present

## 2019-04-05 DIAGNOSIS — E875 Hyperkalemia: Secondary | ICD-10-CM | POA: Diagnosis not present

## 2019-04-05 DIAGNOSIS — C7951 Secondary malignant neoplasm of bone: Secondary | ICD-10-CM | POA: Diagnosis present

## 2019-04-05 DIAGNOSIS — E119 Type 2 diabetes mellitus without complications: Secondary | ICD-10-CM

## 2019-04-05 DIAGNOSIS — Y92410 Unspecified street and highway as the place of occurrence of the external cause: Secondary | ICD-10-CM | POA: Diagnosis not present

## 2019-04-05 DIAGNOSIS — B9620 Unspecified Escherichia coli [E. coli] as the cause of diseases classified elsewhere: Secondary | ICD-10-CM | POA: Diagnosis present

## 2019-04-05 DIAGNOSIS — Z7982 Long term (current) use of aspirin: Secondary | ICD-10-CM | POA: Diagnosis not present

## 2019-04-05 LAB — CBC WITH DIFFERENTIAL/PLATELET
Abs Immature Granulocytes: 0.12 10*3/uL — ABNORMAL HIGH (ref 0.00–0.07)
Basophils Absolute: 0 10*3/uL (ref 0.0–0.1)
Basophils Relative: 0 %
Eosinophils Absolute: 0.1 10*3/uL (ref 0.0–0.5)
Eosinophils Relative: 1 %
HCT: 45.8 % (ref 39.0–52.0)
Hemoglobin: 14.2 g/dL (ref 13.0–17.0)
Immature Granulocytes: 2 %
Lymphocytes Relative: 30 %
Lymphs Abs: 1.8 10*3/uL (ref 0.7–4.0)
MCH: 23.1 pg — ABNORMAL LOW (ref 26.0–34.0)
MCHC: 31 g/dL (ref 30.0–36.0)
MCV: 74.5 fL — ABNORMAL LOW (ref 80.0–100.0)
Monocytes Absolute: 0 10*3/uL — ABNORMAL LOW (ref 0.1–1.0)
Monocytes Relative: 1 %
Neutro Abs: 4 10*3/uL (ref 1.7–7.7)
Neutrophils Relative %: 66 %
Platelets: 195 10*3/uL (ref 150–400)
RBC: 6.15 MIL/uL — ABNORMAL HIGH (ref 4.22–5.81)
RDW: 16.1 % — ABNORMAL HIGH (ref 11.5–15.5)
WBC: 6 10*3/uL (ref 4.0–10.5)
nRBC: 0 % (ref 0.0–0.2)

## 2019-04-05 LAB — COMPREHENSIVE METABOLIC PANEL
ALT: 36 U/L (ref 0–44)
AST: 27 U/L (ref 15–41)
Albumin: 4.2 g/dL (ref 3.5–5.0)
Alkaline Phosphatase: 68 U/L (ref 38–126)
Anion gap: 12 (ref 5–15)
BUN: 22 mg/dL (ref 8–23)
CO2: 24 mmol/L (ref 22–32)
Calcium: 9.8 mg/dL (ref 8.9–10.3)
Chloride: 104 mmol/L (ref 98–111)
Creatinine, Ser: 1.1 mg/dL (ref 0.61–1.24)
GFR calc Af Amer: >60 mL/min (ref >60)
GFR calc non Af Amer: >60 mL/min (ref >60)
Glucose, Bld: 98 mg/dL (ref 70–99)
Potassium: 4.6 mmol/L (ref 3.5–5.1)
Sodium: 140 mmol/L (ref 135–145)
Total Bilirubin: 0.3 mg/dL (ref 0.3–1.2)
Total Protein: 7.6 g/dL (ref 6.5–8.1)

## 2019-04-05 LAB — URINALYSIS, ROUTINE W REFLEX MICROSCOPIC
Bacteria, UA: NONE SEEN
Bilirubin Urine: NEGATIVE
Glucose, UA: NEGATIVE mg/dL
Ketones, ur: NEGATIVE mg/dL
Nitrite: POSITIVE — AB
Protein, ur: 100 mg/dL — AB
RBC / HPF: >50 RBC/hpf — ABNORMAL HIGH (ref 0–5)
Specific Gravity, Urine: 1.01 (ref 1.005–1.030)
WBC, UA: >50 WBC/hpf — ABNORMAL HIGH (ref 0–5)
pH: 6 (ref 5.0–8.0)

## 2019-04-05 LAB — BRAIN NATRIURETIC PEPTIDE: B Natriuretic Peptide: 54.8 pg/mL (ref 0.0–100.0)

## 2019-04-05 LAB — ECHOCARDIOGRAM COMPLETE
Height: 68.5 in
Weight: 3680 oz

## 2019-04-05 LAB — I-STAT CHEM 8, ED
BUN: 23 mg/dL (ref 8–23)
Calcium, Ion: 1.19 mmol/L (ref 1.15–1.40)
Chloride: 102 mmol/L (ref 98–111)
Creatinine, Ser: 1 mg/dL (ref 0.61–1.24)
Glucose, Bld: 99 mg/dL (ref 70–99)
HCT: 45 % (ref 39.0–52.0)
Hemoglobin: 15.3 g/dL (ref 13.0–17.0)
Potassium: 3.9 mmol/L (ref 3.5–5.1)
Sodium: 138 mmol/L (ref 135–145)
TCO2: 27 mmol/L (ref 22–32)

## 2019-04-05 LAB — HEPARIN LEVEL (UNFRACTIONATED)
Heparin Unfractionated: 0.85 IU/mL — ABNORMAL HIGH (ref 0.30–0.70)
Heparin Unfractionated: 0.88 IU/mL — ABNORMAL HIGH (ref 0.30–0.70)

## 2019-04-05 LAB — TROPONIN I (HIGH SENSITIVITY)
Troponin I (High Sensitivity): 14 ng/L (ref <18)
Troponin I (High Sensitivity): 14 ng/L (ref <18)

## 2019-04-05 LAB — HEMATOCRIT: HCT: 35.7 % — ABNORMAL LOW (ref 39.0–52.0)

## 2019-04-05 LAB — SARS CORONAVIRUS 2 (TAT 6-24 HRS): SARS Coronavirus 2: NEGATIVE

## 2019-04-05 LAB — HEMOGLOBIN: Hemoglobin: 11.4 g/dL — ABNORMAL LOW (ref 13.0–17.0)

## 2019-04-05 MED ORDER — SODIUM CHLORIDE 0.9% FLUSH: 3.0000 mL | Freq: Two times a day (BID) | INTRAVENOUS | Status: DC

## 2019-04-05 MED ORDER — ONDANSETRON HCL 4 MG/2ML IJ SOLN: 4.0000 mg | Freq: Four times a day (QID) | INTRAMUSCULAR | Status: DC | PRN

## 2019-04-05 MED ORDER — TAMSULOSIN HCL 0.4 MG PO CAPS
0.4000 mg | ORAL_CAPSULE | Freq: Every day | ORAL | Status: DC
  Administered 2019-04-05 – 2019-04-07 (×3): 0.4 mg via ORAL
  Filled 2019-04-05 (×3): qty 1

## 2019-04-05 MED ORDER — ACETAMINOPHEN 325 MG PO TABS: 650.0000 mg | ORAL_TABLET | Freq: Four times a day (QID) | ORAL | Status: DC | PRN

## 2019-04-05 MED ORDER — ACETAMINOPHEN 650 MG RE SUPP: 650.0000 mg | Freq: Four times a day (QID) | RECTAL | Status: DC | PRN

## 2019-04-05 MED ORDER — LISINOPRIL 20 MG PO TABS: 20.0000 mg | ORAL_TABLET | Freq: Every day | ORAL | Status: DC

## 2019-04-05 MED ORDER — SENNOSIDES-DOCUSATE SODIUM 8.6-50 MG PO TABS: 1.0000 | ORAL_TABLET | Freq: Every evening | ORAL | Status: DC | PRN

## 2019-04-05 MED ORDER — SODIUM CHLORIDE 0.9 % IV SOLN
1.0000 g | INTRAVENOUS | Status: DC
  Administered 2019-04-06 – 2019-04-07 (×2): 1 g via INTRAVENOUS
  Filled 2019-04-05 (×2): qty 10

## 2019-04-05 MED ORDER — SODIUM CHLORIDE 0.9 % IV SOLN: 250.0000 mL | INTRAVENOUS | Status: DC | PRN

## 2019-04-05 MED ORDER — MORPHINE SULFATE (PF) 4 MG/ML IV SOLN: 3.0000 mg | INTRAVENOUS | Status: DC | PRN

## 2019-04-05 MED ORDER — HEPARIN BOLUS VIA INFUSION
4000.0000 [IU] | Freq: Once | INTRAVENOUS | Status: AC
  Administered 2019-04-05: 4000 [IU] via INTRAVENOUS
  Filled 2019-04-05: qty 4000

## 2019-04-05 MED ORDER — ONDANSETRON HCL 4 MG PO TABS
4.0000 mg | ORAL_TABLET | Freq: Four times a day (QID) | ORAL | Status: DC | PRN
  Administered 2019-04-06: 4 mg via ORAL
  Filled 2019-04-05: qty 1

## 2019-04-05 MED ORDER — HEPARIN (PORCINE) 25000 UT/250ML-% IV SOLN
1350.0000 [IU]/h | INTRAVENOUS | Status: DC
  Administered 2019-04-05: 1600 [IU]/h via INTRAVENOUS
  Administered 2019-04-05: 1350 [IU]/h via INTRAVENOUS
  Filled 2019-04-05 (×2): qty 250

## 2019-04-05 MED ORDER — SODIUM CHLORIDE 0.9 % IV SOLN
1.0000 g | Freq: Once | INTRAVENOUS | Status: AC
  Administered 2019-04-05: 1 g via INTRAVENOUS
  Filled 2019-04-05: qty 10

## 2019-04-05 MED ORDER — SODIUM CHLORIDE 0.9% FLUSH: 3.0000 mL | INTRAVENOUS | Status: DC | PRN

## 2019-04-05 MED ORDER — METFORMIN HCL 500 MG PO TABS
500.0000 mg | ORAL_TABLET | Freq: Two times a day (BID) | ORAL | Status: DC
  Administered 2019-04-05 – 2019-04-07 (×4): 500 mg via ORAL
  Filled 2019-04-05 (×4): qty 1

## 2019-04-05 MED ORDER — HYDROCODONE-ACETAMINOPHEN 5-325 MG PO TABS: 1.0000 | ORAL_TABLET | ORAL | Status: DC | PRN

## 2019-04-05 MED ORDER — SODIUM CHLORIDE 0.9 % IV BOLUS
1000.0000 mL | Freq: Once | INTRAVENOUS | Status: AC
  Administered 2019-04-05: 1000 mL via INTRAVENOUS

## 2019-04-05 MED ORDER — AMLODIPINE BESYLATE 5 MG PO TABS
5.0000 mg | ORAL_TABLET | Freq: Every day | ORAL | Status: DC
  Administered 2019-04-05 – 2019-04-06 (×2): 5 mg via ORAL
  Filled 2019-04-05 (×2): qty 1

## 2019-04-05 MED ORDER — METOPROLOL TARTRATE 25 MG PO TABS: 25.0000 mg | ORAL_TABLET | Freq: Every day | ORAL | Status: DC

## 2019-04-05 MED ORDER — IOHEXOL 350 MG/ML SOLN
100.0000 mL | Freq: Once | INTRAVENOUS | Status: AC | PRN
  Administered 2019-04-05: 100 mL via INTRAVENOUS

## 2019-04-05 MED ORDER — OXYCODONE-ACETAMINOPHEN 5-325 MG PO TABS
1.0000 | ORAL_TABLET | ORAL | Status: DC | PRN
  Administered 2019-04-05 – 2019-04-06 (×3): 1 via ORAL
  Filled 2019-04-05 (×3): qty 1

## 2019-04-05 NOTE — ED Notes (Signed)
Admitting doctor at the bedside 

## 2019-04-05 NOTE — ED Notes (Signed)
EATING A SANDWICH  HE REPORTS THAT HE STILL HAS PAIN
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
## 2019-04-05 NOTE — Progress Notes (Signed)
  Echocardiogram 2D Echocardiogram has been performed.  John Shields 04/05/2019, 5:01 PM

## 2019-04-05 NOTE — ED Notes (Signed)
Lunch Try Ordered @ 1028. 
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
## 2019-04-05 NOTE — ED Notes (Signed)
Confirmed Heparin infusion with Su Grand.

## 2019-04-05 NOTE — Progress Notes (Signed)
  Echocardiogram 2D Echocardiogram has been performed.  Johny Chess 04/05/2019, 5:02 PM

## 2019-04-05 NOTE — Progress Notes (Signed)
ANTICOAGULATION CONSULT NOTE - Initial Consult  Pharmacy Consult for Heparin Indication: pulmonary embolus  No Known Allergies  Patient Measurements: Height: 5' 8.5" (174 cm) Weight: 230 lb (104.3 kg) IBW/kg (Calculated) : 69.55 Heparin Dosing Weight: 90 kg  Vital Signs: Temp: 98.3 F (36.8 C) (11/06 1801) Temp Source: Oral (11/06 1801) BP: 106/68 (11/07 0500) Pulse Rate: 103 (11/07 0500)  Labs: Recent Labs    04/05/19 0022 04/05/19 0253  HGB 14.2 15.3  HCT 45.8 45.0  PLT 195  --   CREATININE 1.10 1.00    Estimated Creatinine Clearance: 77.7 mL/min (by C-G formula based on SCr of 1 mg/dL).   Medical History: Past Medical History:  Diagnosis Date  . Foley catheter in place   . History of sepsis 12/27/2016   sepsis from prostate abscess post prostate bx  . Hypertension   . LBBB (left bundle branch block)   . Prostate cancer, primary, 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 [facility]) urologist-  dr Tresa Moore   dx 07/ 2018 bx-- Gleason 8, PSA 400, vol 350ml-- abd. lymphadenopathy  . Renal cancer, right ([place])   . Thrombocytopenia ([place])   . Urine retention    intermittant since 2016    Medications:  No current facility-administered medications on file prior to encounter.    Current Outpatient Medications on File Prior to Encounter  Medication Sig Dispense Refill  . amLODipine (NORVASC) 5 MG tablet Take 1 tablet (5 mg total) by mouth daily. 30 tablet 0  . aspirin EC 81 MG tablet Take 81 mg by mouth 2 (two) times daily.    Marland Kitchen atorvastatin (LIPITOR) 80 MG tablet Take 1 tablet (80 mg total) by mouth daily at 6 PM. (Patient taking differently: Take 80 mg by mouth at bedtime. ) 30 tablet 0  . b complex vitamins tablet Take 1 tablet by mouth daily.    . Cholecalciferol (VITAMIN D3 PO) Take 1 tablet by mouth daily.    Marland Kitchen lisinopril (PRINIVIL,ZESTRIL) 20 MG tablet Take 1 tablet (20 mg total) by mouth daily. 30 tablet 0  . metFORMIN (GLUCOPHAGE) 500 MG tablet Take 1 tablet (500  mg total) by mouth daily with breakfast. 30 tablet 0  . metoprolol tartrate (LOPRESSOR) 25 MG tablet Take 25 mg by mouth daily.    . tamsulosin (FLOMAX) 0.4 MG CAPS capsule Take 0.4 mg by mouth daily.        Assessment: 73 y.o. male with PE for heparin Goal of Therapy:  Heparin level 0.3-0.7 units/ml Monitor platelets by anticoagulation protocol: Yes   Plan:  Heparin 4000 units IV bolus, then start heparin 1600 units/hr Check heparin level in 6 hours.   Madisyn Mawhinney, Bronson Curb 04/05/2019,5:16 AM
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
## 2019-04-05 NOTE — ED Notes (Signed)
Secretary informed this RN that the pt called out because he could not breathe. Assessed pt and noted he was on RA @ 92%. Placed on 3L [place] and pt O2 increased to 98%. Pt still complaining of shob. Requested MD to come to bedside to assess. Fluids began and will continue to monitor

## 2019-04-06 ENCOUNTER — Other Ambulatory Visit: Payer: Self-pay

## 2019-04-06 ENCOUNTER — Inpatient Hospital Stay (HOSPITAL_COMMUNITY): Payer: No Typology Code available for payment source

## 2019-04-06 DIAGNOSIS — I2699 Other pulmonary embolism without acute cor pulmonale: Secondary | ICD-10-CM

## 2019-04-06 LAB — BASIC METABOLIC PANEL
Anion gap: 10 (ref 5–15)
BUN: 28 mg/dL — ABNORMAL HIGH (ref 8–23)
CO2: 23 mmol/L (ref 22–32)
Calcium: 8.4 mg/dL — ABNORMAL LOW (ref 8.9–10.3)
Chloride: 101 mmol/L (ref 98–111)
Creatinine, Ser: 1.3 mg/dL — ABNORMAL HIGH (ref 0.61–1.24)
GFR calc Af Amer: >60 mL/min (ref >60)
GFR calc non Af Amer: 54 mL/min — ABNORMAL LOW (ref >60)
Glucose, Bld: 110 mg/dL — ABNORMAL HIGH (ref 70–99)
Potassium: 4.1 mmol/L (ref 3.5–5.1)
Sodium: 134 mmol/L — ABNORMAL LOW (ref 135–145)

## 2019-04-06 LAB — CBC
HCT: 33.5 % — ABNORMAL LOW (ref 39.0–52.0)
Hemoglobin: 10.5 g/dL — ABNORMAL LOW (ref 13.0–17.0)
MCH: 22.7 pg — ABNORMAL LOW (ref 26.0–34.0)
MCHC: 31.3 g/dL (ref 30.0–36.0)
MCV: 72.5 fL — ABNORMAL LOW (ref 80.0–100.0)
Platelets: 173 10*3/uL (ref 150–400)
RBC: 4.62 MIL/uL (ref 4.22–5.81)
RDW: 14.7 % (ref 11.5–15.5)
WBC: 20.1 10*3/uL — ABNORMAL HIGH (ref 4.0–10.5)
nRBC: 0 % (ref 0.0–0.2)

## 2019-04-06 LAB — HEPARIN LEVEL (UNFRACTIONATED): Heparin Unfractionated: 0.67 IU/mL (ref 0.30–0.70)

## 2019-04-06 MED ORDER — APIXABAN 2.5 MG PO TABS
2.5000 mg | ORAL_TABLET | Freq: Two times a day (BID) | ORAL | Status: DC
  Administered 2019-04-06 – 2019-04-07 (×3): 2.5 mg via ORAL
  Filled 2019-04-06 (×3): qty 1

## 2019-04-06 MED ORDER — MORPHINE SULFATE (PF) 2 MG/ML IV SOLN: 1.0000 mg | INTRAVENOUS | Status: DC | PRN
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
## 2019-04-06 NOTE — Progress Notes (Signed)
VASCULAR LAB PRELIMINARY  PRELIMINARY  PRELIMINARY  PRELIMINARY  Left lower extremity venous duplex completed.    Preliminary report:  See CV proc for preliminary results.  Dugan Vanhoesen, RVT 04/06/2019, 10:34 AM
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
## 2019-04-07 LAB — RENAL FUNCTION PANEL
Albumin: 3.1 g/dL — ABNORMAL LOW (ref 3.5–5.0)
Anion gap: 9 (ref 5–15)
BUN: 22 mg/dL (ref 8–23)
CO2: 26 mmol/L (ref 22–32)
Calcium: 8.9 mg/dL (ref 8.9–10.3)
Chloride: 100 mmol/L (ref 98–111)
Creatinine, Ser: 1.31 mg/dL — ABNORMAL HIGH (ref 0.61–1.24)
GFR calc Af Amer: >60 mL/min (ref >60)
GFR calc non Af Amer: 54 mL/min — ABNORMAL LOW (ref >60)
Glucose, Bld: 106 mg/dL — ABNORMAL HIGH (ref 70–99)
Phosphorus: 3.5 mg/dL (ref 2.5–4.6)
Potassium: 5 mmol/L (ref 3.5–5.1)
Sodium: 135 mmol/L (ref 135–145)

## 2019-04-07 LAB — CBC WITH DIFFERENTIAL/PLATELET
Abs Immature Granulocytes: 0.06 10*3/uL (ref 0.00–0.07)
Basophils Absolute: 0 10*3/uL (ref 0.0–0.1)
Basophils Relative: 0 %
Eosinophils Absolute: 0.1 10*3/uL (ref 0.0–0.5)
Eosinophils Relative: 1 %
HCT: 34.7 % — ABNORMAL LOW (ref 39.0–52.0)
Hemoglobin: 11 g/dL — ABNORMAL LOW (ref 13.0–17.0)
Immature Granulocytes: 1 %
Lymphocytes Relative: 21 %
Lymphs Abs: 2.5 10*3/uL (ref 0.7–4.0)
MCH: 23.2 pg — ABNORMAL LOW (ref 26.0–34.0)
MCHC: 31.7 g/dL (ref 30.0–36.0)
MCV: 73.1 fL — ABNORMAL LOW (ref 80.0–100.0)
Monocytes Absolute: 1.3 10*3/uL — ABNORMAL HIGH (ref 0.1–1.0)
Monocytes Relative: 11 %
Neutro Abs: 7.8 10*3/uL — ABNORMAL HIGH (ref 1.7–7.7)
Neutrophils Relative %: 66 %
Platelets: 171 10*3/uL (ref 150–400)
RBC: 4.75 MIL/uL (ref 4.22–5.81)
RDW: 15.1 % (ref 11.5–15.5)
WBC: 11.9 10*3/uL — ABNORMAL HIGH (ref 4.0–10.5)
nRBC: 0 % (ref 0.0–0.2)

## 2019-04-07 LAB — URINE CULTURE: Culture: >=100000 — AB

## 2019-04-07 MED ORDER — OXYCODONE-ACETAMINOPHEN 5-325 MG PO TABS: 1.0000 | ORAL_TABLET | ORAL | 0 refills | Status: DC | PRN

## 2019-04-07 MED ORDER — APIXABAN 2.5 MG PO TABS: 2.5000 mg | ORAL_TABLET | Freq: Two times a day (BID) | ORAL | 6 refills | Status: DC

## 2019-04-07 MED ORDER — APIXABAN 2.5 MG PO TABS
2.5000 mg | ORAL_TABLET | Freq: Once | ORAL | Status: AC
  Administered 2019-04-07: 2.5 mg via ORAL
  Filled 2019-04-07: qty 1

## 2019-04-07 MED ORDER — CEPHALEXIN 500 MG PO CAPS: 500.0000 mg | ORAL_CAPSULE | Freq: Two times a day (BID) | ORAL | 0 refills | Status: AC

## 2019-04-07 MED ORDER — APIXABAN 5 MG PO TABS: 5.0000 mg | ORAL_TABLET | Freq: Two times a day (BID) | ORAL | 6 refills | Status: DC

## 2019-04-07 MED ORDER — NITROFURANTOIN MACROCRYSTAL 100 MG PO CAPS: 100.0000 mg | ORAL_CAPSULE | Freq: Two times a day (BID) | ORAL | 0 refills | Status: DC

## 2019-04-07 MED ORDER — APIXABAN 5 MG PO TABS: 5.0000 mg | ORAL_TABLET | Freq: Two times a day (BID) | ORAL | Status: DC

## 2019-04-07 MED FILL — ELIQUIS 5 MG TABLET: 5 | 30 days supply | Qty: 60 | Fill #0

## 2019-04-07 MED FILL — CEPHALEXIN 500 MG CAPSULE: 500 | 2 days supply | Qty: 4 | Fill #0

## 2019-04-07 NOTE — Progress Notes (Signed)
DME to be delivered at home. Patient informed and verbalized understanding.
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
## 2019-04-07 NOTE — Discharge Instructions (Signed)
Information on my medicine - ELIQUIS (apixaban)  What do You need to know about Eliquis ? Take your Eliquis TWICE DAILY - one tablet in the morning and one tablet in the evening with or without food. If you have difficulty swallowing the tablet whole please discuss with your pharmacist how to take the medication safely.  Take Eliquis exactly as prescribed by your doctor and DO NOT stop taking Eliquis without talking to the doctor who prescribed the medication.  Stopping may increase your risk of developing a stroke.  Refill your prescription before you run out.  After discharge, you should have regular check-up appointments with your healthcare provider that is prescribing your Eliquis.  In the future your dose may need to be changed if your kidney function or weight changes by a significant amount or as you get older.  What do you do if you miss a dose? If you miss a dose, take it as soon as you remember on the same day and resume taking twice daily.  Do not take more than one dose of ELIQUIS at the same time to make up a missed dose.  Important Safety Information A possible side effect of Eliquis is bleeding. You should call your healthcare provider right away if you experience any of the following: ? Bleeding from an injury or your nose that does not stop. ? Unusual colored urine (red or dark brown) or unusual colored stools (red or black). ? Unusual bruising for unknown reasons. ? A serious fall or if you hit your head (even if there is no bleeding).  Some medicines may interact with Eliquis and might increase your risk of bleeding or clotting while on Eliquis. To help avoid this, consult your healthcare provider or pharmacist prior to using any new prescription or non-prescription medications, including herbals, vitamins, non-steroidal anti-inflammatory drugs (NSAIDs) and supplements.  This website has more information on Eliquis (apixaban): http://[email or web address].com/eliquis/home

## 2019-04-07 NOTE — Discharge Summary (Addendum)
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 DETECTED. The SARS-CoV-2 RNA is generally detectable in upper and lower respiratory specimens during the acute phase of infection. Negative results do not preclude SARS-CoV-2 infection, do not rule out co-infections with other pathogens, and should not be used as the sole basis for treatment or other patient management decisions. Negative results must be combined with clinical observations, patient history, and epidemiological information. The expected result is Negative. Fact Sheet for Patients: SugarRoll.be Fact Sheet for Healthcare Providers: https://www.woods-mathews.com/ This test is not yet approved or cleared by the [place] FDA and  has been authorized for detection and/or diagnosis of SARS-CoV-2 by FDA under an Emergency Use Authorization (EUA).  This EUA will remain  in effect (meaning this test can be used) for the duration of the COVID-19 declaration under Section 56 4(b)(1) of the Act, 21  U.S.C. section 360bbb-3(b)(1), unless the authorization is terminated or revoked sooner. Performed at [facility], [place] [address], [place], [place] [address]      Labs: Basic Metabolic Panel: Recent Labs  Lab 04/05/19 0022 04/05/19 0253 04/06/19 0339 04/07/19 0319  NA 140 138 134* 135  K 4.6 3.9 4.1 5.0  CL 104 102 101 100  CO2 24  --  23 26  GLUCOSE 98 99 110* 106*  BUN 22 23 28* 22  CREATININE 1.10 1.00 1.30* 1.31*  CALCIUM 9.8  --  8.4* 8.9  PHOS  --   --   --  3.5   Liver Function Tests: Recent Labs  Lab 04/05/19 0022 04/07/19 0319  AST 27  --   ALT 36  --   ALKPHOS 68  --   BILITOT 0.3  --   PROT 7.6  --   ALBUMIN 4.2 3.1*   No results for input(s): LIPASE, AMYLASE in the last 168 hours. No results for input(s): AMMONIA in the last 168 hours. CBC: Recent Labs  Lab 04/05/19 0022 04/05/19 0253 04/05/19 0839 04/06/19 0339 04/07/19 0319  WBC 6.0  --   --  20.1* 11.9*  NEUTROABS 4.0  --   --   --  7.8*  HGB 14.2 15.3 11.4* 10.5* 11.0*  HCT 45.8 45.0 35.7* 33.5* 34.7*  MCV 74.5*  --   --  72.5* 73.1*  PLT 195  --   --  173 171   Cardiac Enzymes: No results for input(s): CKTOTAL, CKMB, CKMBINDEX, TROPONINI in the last 168 hours. BNP: BNP (last 3 results) Recent Labs    04/05/19 0651  BNP 54.8    ProBNP (last 3 results) No results for input(s): PROBNP in the last 8760 hours.  CBG: No results for input(s): GLUCAP in the last 168 hours.     Signed:  Nita Sells MD   [facility] 04/07/2019, 8:43 AM
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
## 2019-04-07 NOTE — Progress Notes (Signed)
Mertha Finders to be D/C'd Home per MD order.  Discussed with the patient and all questions fully answered.  VSS, Skin clean, dry and intact without evidence of skin break down, no evidence of skin tears noted. IV catheter discontinued intact. Site without signs and symptoms of complications. Dressing and pressure applied.  An After Visit Summary was printed and given to the patient. Patient received prescriptions.  D/c education completed with patient including follow up instructions, medication list, d/c activities limitations if indicated, with other d/c instructions as indicated by MD - patient able to verbalize understanding, all questions fully answered.   Patient instructed to return to ED, call 911, or call MD for any changes in condition.   Patient left without walker and 3 in 1 with intention of sending someone back to pick it up. Patient insisted on leaving because 'his ride can't wait'. Patient escorted via [place], and D/C home via private auto.   Jeanella Craze 04/07/2019 2:12 PM

## 2019-04-09 DIAGNOSIS — I700 Atherosclerosis of aorta: Secondary | ICD-10-CM | POA: Diagnosis not present

## 2019-04-09 DIAGNOSIS — S3991XD Unspecified injury of abdomen, subsequent encounter: Secondary | ICD-10-CM | POA: Diagnosis not present

## 2019-04-09 DIAGNOSIS — M62562 Muscle wasting and atrophy, not elsewhere classified, left lower leg: Secondary | ICD-10-CM | POA: Diagnosis not present

## 2019-04-09 DIAGNOSIS — M7989 Other specified soft tissue disorders: Secondary | ICD-10-CM | POA: Diagnosis not present

## 2019-04-09 DIAGNOSIS — K5900 Constipation, unspecified: Secondary | ICD-10-CM | POA: Diagnosis not present

## 2019-04-09 DIAGNOSIS — N2889 Other specified disorders of kidney and ureter: Secondary | ICD-10-CM | POA: Diagnosis not present

## 2019-04-09 DIAGNOSIS — B9620 Unspecified Escherichia coli [E. coli] as the cause of diseases classified elsewhere: Secondary | ICD-10-CM | POA: Diagnosis not present

## 2019-04-09 DIAGNOSIS — M25462 Effusion, left knee: Secondary | ICD-10-CM | POA: Diagnosis not present

## 2019-04-09 DIAGNOSIS — M76892 Other specified enthesopathies of left lower limb, excluding foot: Secondary | ICD-10-CM | POA: Diagnosis not present

## 2019-04-09 DIAGNOSIS — I729 Aneurysm of unspecified site: Secondary | ICD-10-CM | POA: Diagnosis not present

## 2019-04-09 DIAGNOSIS — I447 Left bundle-branch block, unspecified: Secondary | ICD-10-CM | POA: Diagnosis not present

## 2019-04-09 DIAGNOSIS — C7989 Secondary malignant neoplasm of other specified sites: Secondary | ICD-10-CM | POA: Diagnosis not present

## 2019-04-09 DIAGNOSIS — N390 Urinary tract infection, site not specified: Secondary | ICD-10-CM | POA: Diagnosis not present

## 2019-04-09 DIAGNOSIS — N401 Enlarged prostate with lower urinary tract symptoms: Secondary | ICD-10-CM | POA: Diagnosis not present

## 2019-04-09 DIAGNOSIS — R338 Other retention of urine: Secondary | ICD-10-CM | POA: Diagnosis not present

## 2019-04-09 DIAGNOSIS — S8992XD Unspecified injury of left lower leg, subsequent encounter: Secondary | ICD-10-CM | POA: Diagnosis not present

## 2019-04-09 DIAGNOSIS — D696 Thrombocytopenia, unspecified: Secondary | ICD-10-CM | POA: Diagnosis not present

## 2019-04-09 DIAGNOSIS — I119 Hypertensive heart disease without heart failure: Secondary | ICD-10-CM | POA: Diagnosis not present

## 2019-04-09 DIAGNOSIS — I69354 Hemiplegia and hemiparesis following cerebral infarction affecting left non-dominant side: Secondary | ICD-10-CM | POA: Diagnosis not present

## 2019-04-09 DIAGNOSIS — C61 Malignant neoplasm of prostate: Secondary | ICD-10-CM | POA: Diagnosis not present

## 2019-04-09 DIAGNOSIS — M85862 Other specified disorders of bone density and structure, left lower leg: Secondary | ICD-10-CM | POA: Diagnosis not present

## 2019-04-09 DIAGNOSIS — N179 Acute kidney failure, unspecified: Secondary | ICD-10-CM | POA: Diagnosis not present

## 2019-04-09 DIAGNOSIS — E119 Type 2 diabetes mellitus without complications: Secondary | ICD-10-CM | POA: Diagnosis not present

## 2019-04-09 DIAGNOSIS — I2699 Other pulmonary embolism without acute cor pulmonale: Secondary | ICD-10-CM | POA: Diagnosis not present

## 2019-04-09 DIAGNOSIS — M1712 Unilateral primary osteoarthritis, left knee: Secondary | ICD-10-CM | POA: Diagnosis not present

## 2019-04-10 ENCOUNTER — Other Ambulatory Visit: Payer: Self-pay | Admitting: *Deleted

## 2019-04-10 DIAGNOSIS — M1712 Unilateral primary osteoarthritis, left knee: Secondary | ICD-10-CM | POA: Diagnosis not present

## 2019-04-10 DIAGNOSIS — I69354 Hemiplegia and hemiparesis following cerebral infarction affecting left non-dominant side: Secondary | ICD-10-CM | POA: Diagnosis not present

## 2019-04-10 DIAGNOSIS — B9620 Unspecified Escherichia coli [E. coli] as the cause of diseases classified elsewhere: Secondary | ICD-10-CM | POA: Diagnosis not present

## 2019-04-10 DIAGNOSIS — I2699 Other pulmonary embolism without acute cor pulmonale: Secondary | ICD-10-CM | POA: Diagnosis not present

## 2019-04-10 DIAGNOSIS — I119 Hypertensive heart disease without heart failure: Secondary | ICD-10-CM | POA: Diagnosis not present

## 2019-04-10 DIAGNOSIS — N390 Urinary tract infection, site not specified: Secondary | ICD-10-CM | POA: Diagnosis not present

## 2019-04-10 NOTE — Patient Outreach (Signed)
[facility]) Care Management  04/10/2019  John Shields January 21, 1946 [id number]    EMMI-GENERAL DISCHARGE RED ON EMMI ALERT Day #1 Date:04/09/2019 Red Alert Reason: NO SCHEDULED FOLLOW UP   OUTREACH #1:  RN attempted outreach call however unsuccessful. RN able to leave a HIPAA approved voice message requesting a call back. Will further engage on the above EMMI at that time.  PLAN: Will follow up once again within the next week.  Raina Mina, RN Care Management Coordinator [place] Office [phone]

## 2019-04-14 ENCOUNTER — Other Ambulatory Visit: Payer: Self-pay | Admitting: *Deleted

## 2019-04-14 NOTE — Patient Outreach (Signed)
[facility] [facility]) Care Management  04/14/2019  Draysen Lowery 1946-03-29 [id number]   EMMI-GENERAL DISCHARGE RED ON EMMI ALERT Day #1 & 4 Date:04/09/2019 &04/12/2019 Red Alert Reason: NO FOLLOW UP APPOINTMENT   OUTREACH #2  RN SPOKE WITH PT TODAY AND INQUIRED FURTHER ON THE RED EMMI FOR NO FOLLOW UP APPOINTMENTS. PT STATES HE HAS RECEIVED SEVERAL CALLS AND NOT SURE IF IT'S FROM THE CLINIC. RN OFFERED TO PROVIDE PT WITH THE CONTACT NUMBER TO SCHEDULED A VISIT OR CALL BASED UPON HIS OTHER PENDING APPOINTMENTS [phone]-[facility]. PT RECEIVED AND TOOK THE PROVIDED CONTACT NUMBER. PT ALSO STATES HE WAS SUPPOSE TO GET SEVERAL DME. RN RESEARCHED AND FOUND THAT [facility] WAS ORDERED.  RN PROVIDED THE CONTACT NUMBER FOR AND REQUESTED PT TO CALL TO INQUIRED ON THE ORDERED DME.   RN INQUIRED ON ANY OTHER ISSUES OR NEEDS AT THIS TIME. PT HAS A SUPPORTIVE NEIGHBOR TO ASSIST AND GRATEFUL FOR THE ASSISTANCE TODAY. NO OTHER NEEDS AT THIS TIME. PT IS AWARE MOST PROVIDER OFFICES ARE DOING VIRTUAL CALLS FOR VISITS.  WILL CLOSE EMMI WITH RESOLUTION PENDING.  Raina Mina, RN Care Management Coordinator [place] Office [phone]

## 2019-04-15 ENCOUNTER — Ambulatory Visit: Payer: Medicare Other | Admitting: *Deleted

## 2019-04-15 DIAGNOSIS — B9620 Unspecified Escherichia coli [E. coli] as the cause of diseases classified elsewhere: Secondary | ICD-10-CM | POA: Diagnosis not present

## 2019-04-15 DIAGNOSIS — M1712 Unilateral primary osteoarthritis, left knee: Secondary | ICD-10-CM | POA: Diagnosis not present

## 2019-04-15 DIAGNOSIS — I2699 Other pulmonary embolism without acute cor pulmonale: Secondary | ICD-10-CM | POA: Diagnosis not present

## 2019-04-15 DIAGNOSIS — N390 Urinary tract infection, site not specified: Secondary | ICD-10-CM | POA: Diagnosis not present

## 2019-04-15 DIAGNOSIS — I119 Hypertensive heart disease without heart failure: Secondary | ICD-10-CM | POA: Diagnosis not present

## 2019-04-15 DIAGNOSIS — I69354 Hemiplegia and hemiparesis following cerebral infarction affecting left non-dominant side: Secondary | ICD-10-CM | POA: Diagnosis not present

## 2019-04-17 DIAGNOSIS — M1712 Unilateral primary osteoarthritis, left knee: Secondary | ICD-10-CM | POA: Diagnosis not present

## 2019-04-17 DIAGNOSIS — B9620 Unspecified Escherichia coli [E. coli] as the cause of diseases classified elsewhere: Secondary | ICD-10-CM | POA: Diagnosis not present

## 2019-04-17 DIAGNOSIS — N390 Urinary tract infection, site not specified: Secondary | ICD-10-CM | POA: Diagnosis not present

## 2019-04-17 DIAGNOSIS — I2699 Other pulmonary embolism without acute cor pulmonale: Secondary | ICD-10-CM | POA: Diagnosis not present

## 2019-04-17 DIAGNOSIS — I69354 Hemiplegia and hemiparesis following cerebral infarction affecting left non-dominant side: Secondary | ICD-10-CM | POA: Diagnosis not present

## 2019-04-17 DIAGNOSIS — I119 Hypertensive heart disease without heart failure: Secondary | ICD-10-CM | POA: Diagnosis not present

## 2019-04-22 DIAGNOSIS — I2699 Other pulmonary embolism without acute cor pulmonale: Secondary | ICD-10-CM | POA: Diagnosis not present

## 2019-04-22 DIAGNOSIS — N390 Urinary tract infection, site not specified: Secondary | ICD-10-CM | POA: Diagnosis not present

## 2019-04-22 DIAGNOSIS — I119 Hypertensive heart disease without heart failure: Secondary | ICD-10-CM | POA: Diagnosis not present

## 2019-04-22 DIAGNOSIS — M1712 Unilateral primary osteoarthritis, left knee: Secondary | ICD-10-CM | POA: Diagnosis not present

## 2019-04-22 DIAGNOSIS — B9620 Unspecified Escherichia coli [E. coli] as the cause of diseases classified elsewhere: Secondary | ICD-10-CM | POA: Diagnosis not present

## 2019-04-22 DIAGNOSIS — I69354 Hemiplegia and hemiparesis following cerebral infarction affecting left non-dominant side: Secondary | ICD-10-CM | POA: Diagnosis not present

## 2019-04-28 DIAGNOSIS — I119 Hypertensive heart disease without heart failure: Secondary | ICD-10-CM | POA: Diagnosis not present

## 2019-04-28 DIAGNOSIS — I2699 Other pulmonary embolism without acute cor pulmonale: Secondary | ICD-10-CM | POA: Diagnosis not present

## 2019-04-28 DIAGNOSIS — N390 Urinary tract infection, site not specified: Secondary | ICD-10-CM | POA: Diagnosis not present

## 2019-04-28 DIAGNOSIS — B9620 Unspecified Escherichia coli [E. coli] as the cause of diseases classified elsewhere: Secondary | ICD-10-CM | POA: Diagnosis not present

## 2019-04-28 DIAGNOSIS — M1712 Unilateral primary osteoarthritis, left knee: Secondary | ICD-10-CM | POA: Diagnosis not present

## 2019-04-28 DIAGNOSIS — I69354 Hemiplegia and hemiparesis following cerebral infarction affecting left non-dominant side: Secondary | ICD-10-CM | POA: Diagnosis not present

## 2019-04-30 DIAGNOSIS — I69354 Hemiplegia and hemiparesis following cerebral infarction affecting left non-dominant side: Secondary | ICD-10-CM | POA: Diagnosis not present

## 2019-04-30 DIAGNOSIS — I2699 Other pulmonary embolism without acute cor pulmonale: Secondary | ICD-10-CM | POA: Diagnosis not present

## 2019-04-30 DIAGNOSIS — B9620 Unspecified Escherichia coli [E. coli] as the cause of diseases classified elsewhere: Secondary | ICD-10-CM | POA: Diagnosis not present

## 2019-04-30 DIAGNOSIS — N390 Urinary tract infection, site not specified: Secondary | ICD-10-CM | POA: Diagnosis not present

## 2019-04-30 DIAGNOSIS — M1712 Unilateral primary osteoarthritis, left knee: Secondary | ICD-10-CM | POA: Diagnosis not present

## 2019-04-30 DIAGNOSIS — I119 Hypertensive heart disease without heart failure: Secondary | ICD-10-CM | POA: Diagnosis not present

## 2019-05-05 DIAGNOSIS — I2699 Other pulmonary embolism without acute cor pulmonale: Secondary | ICD-10-CM | POA: Diagnosis not present

## 2019-05-05 DIAGNOSIS — M1712 Unilateral primary osteoarthritis, left knee: Secondary | ICD-10-CM | POA: Diagnosis not present

## 2019-05-05 DIAGNOSIS — B9620 Unspecified Escherichia coli [E. coli] as the cause of diseases classified elsewhere: Secondary | ICD-10-CM | POA: Diagnosis not present

## 2019-05-05 DIAGNOSIS — N390 Urinary tract infection, site not specified: Secondary | ICD-10-CM | POA: Diagnosis not present

## 2019-05-05 DIAGNOSIS — I69354 Hemiplegia and hemiparesis following cerebral infarction affecting left non-dominant side: Secondary | ICD-10-CM | POA: Diagnosis not present

## 2019-05-05 DIAGNOSIS — I119 Hypertensive heart disease without heart failure: Secondary | ICD-10-CM | POA: Diagnosis not present

## 2019-05-26 ENCOUNTER — Other Ambulatory Visit: Payer: Medicare Other

## 2019-12-20 IMAGING — CT CT ANGIO CHEST
2 of 8 series · 18 of 46 positions shown · IV contrast (OMNI)
Comparison: 12/23/2016

CLINICAL DATA: Prostate cancer, shortness of breath, elevated
D-dimer, intermediate clinical suspicion of pulmonary embolism,
history hypertension

EXAM:
CT ANGIOGRAPHY CHEST WITH CONTRAST
TECHNIQUE: Multidetector CT imaging of the chest was performed using the
standard protocol during bolus administration of intravenous
contrast. Multiplanar CT image reconstructions and MIPs were
obtained to evaluate the vascular anatomy.
CONTRAST:  100mL [id number] IOPAMIDOL ([id number]) INJECTION 76% IV

[Series 6: thins · axial · 0.70mm/px · z∈[+1130,+1391]mm · 15 of 289 slices shown]
[im 14/289  lung]
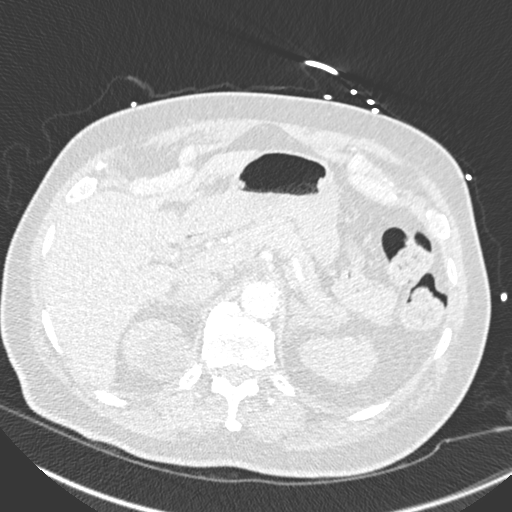
[im 40/289  soft-tissue]
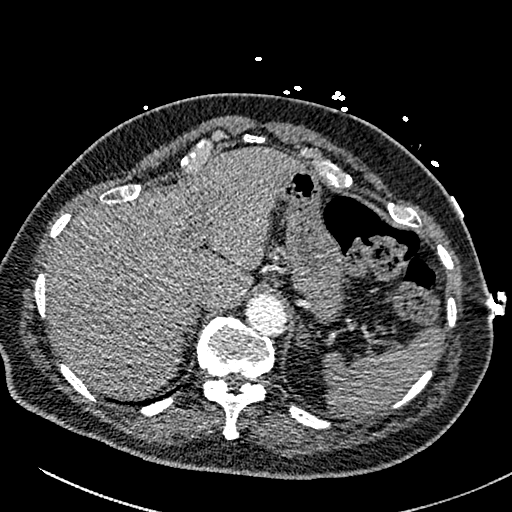
[im 53/289  lung]
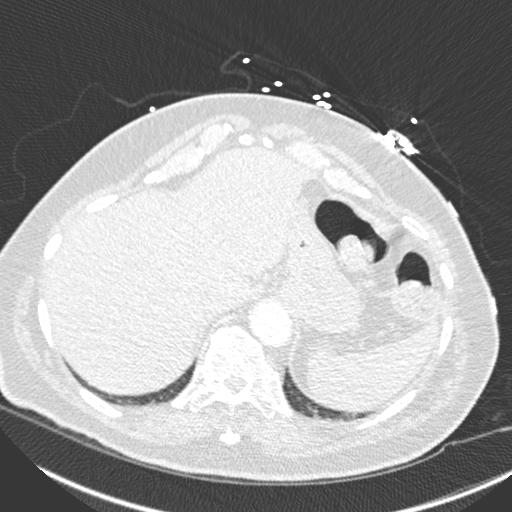
[im 66/289  soft-tissue]
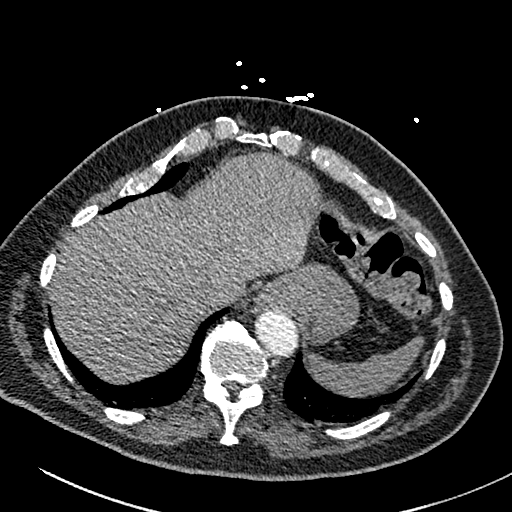
[im 92/289  lung]
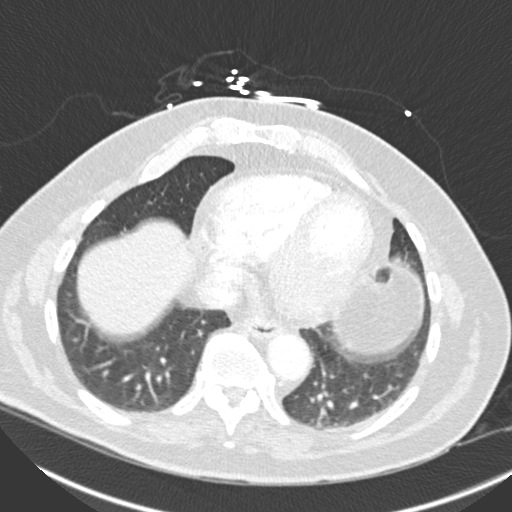
[im 105/289  soft-tissue]
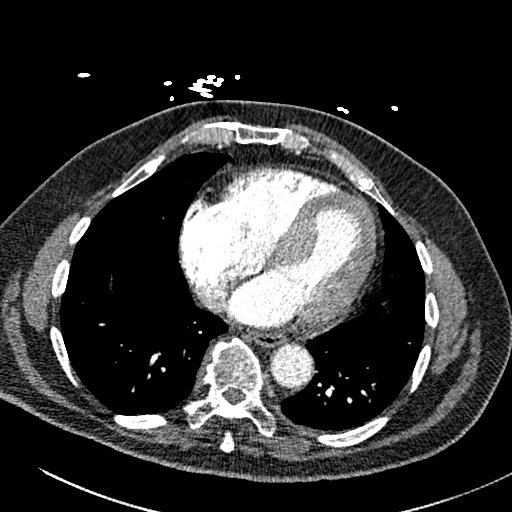
[im 131/289  lung]
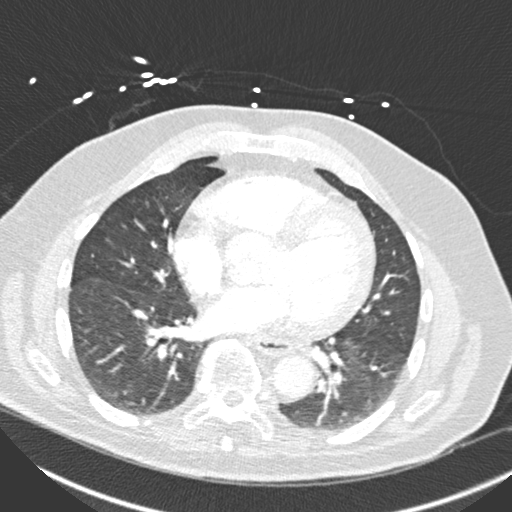
[im 145/289  soft-tissue]
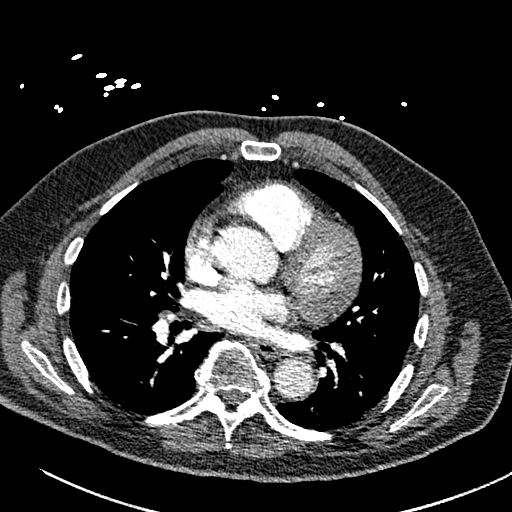
[im 158/289  lung]
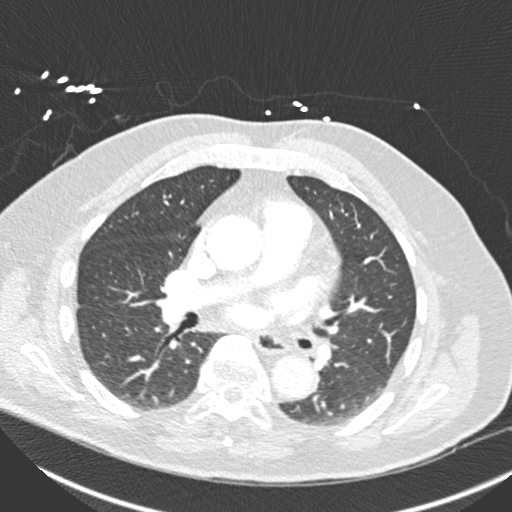
[im 184/289  soft-tissue]
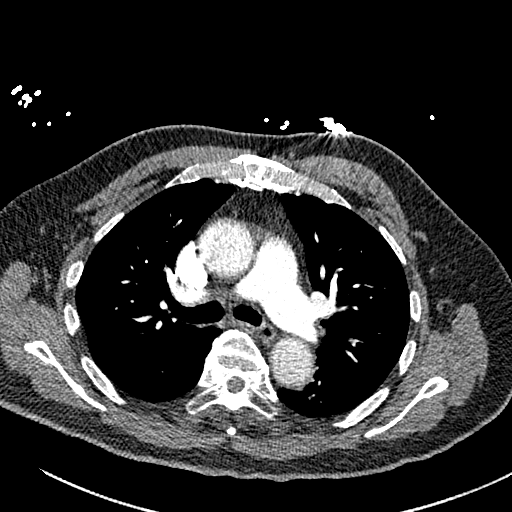
[im 197/289  lung]
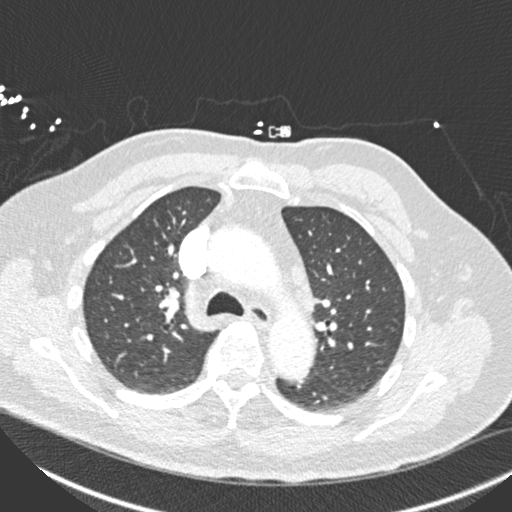
[im 223/289  soft-tissue]
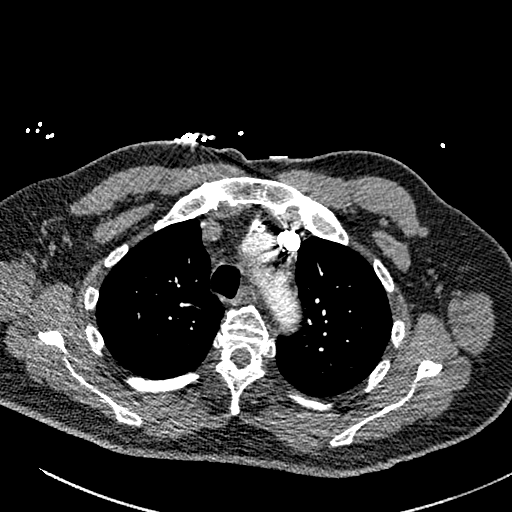
[im 236/289  lung]
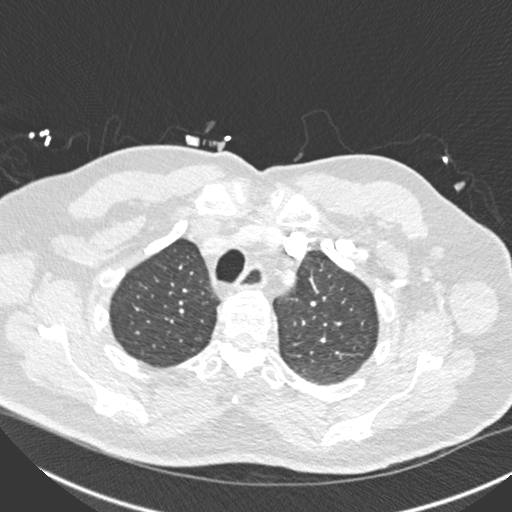
[im 249/289  soft-tissue]
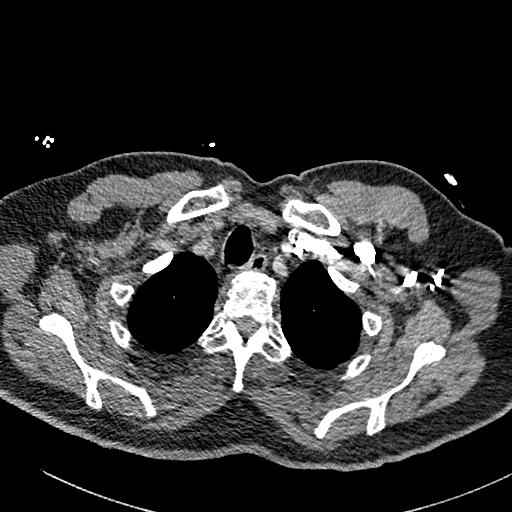
[im 275/289  lung]
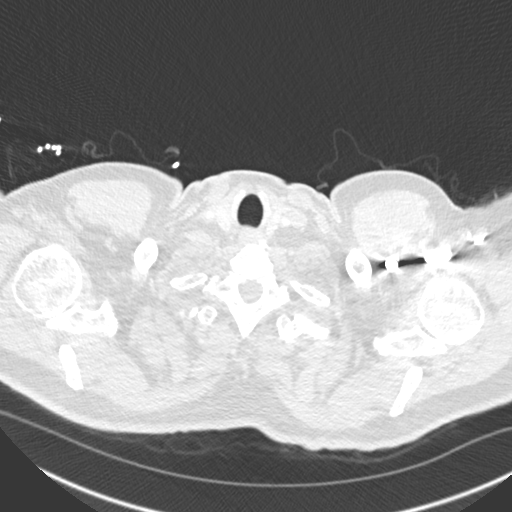

[Series 8: coronal mpr · coronal · 0.59mm/px · 3 of 145 slices shown]
[im 37/145  soft-tissue]
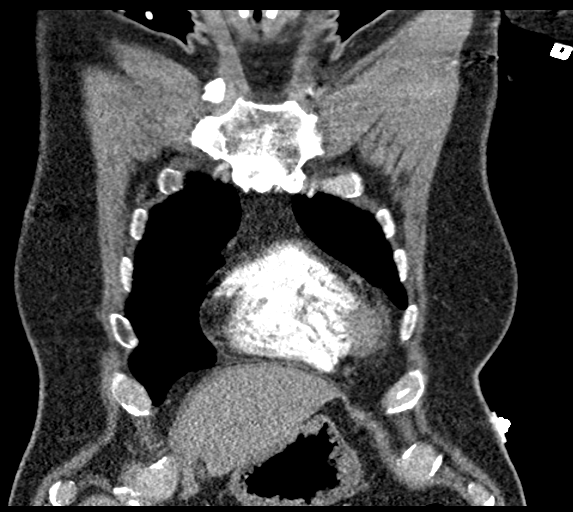
[im 73/145  soft-tissue]
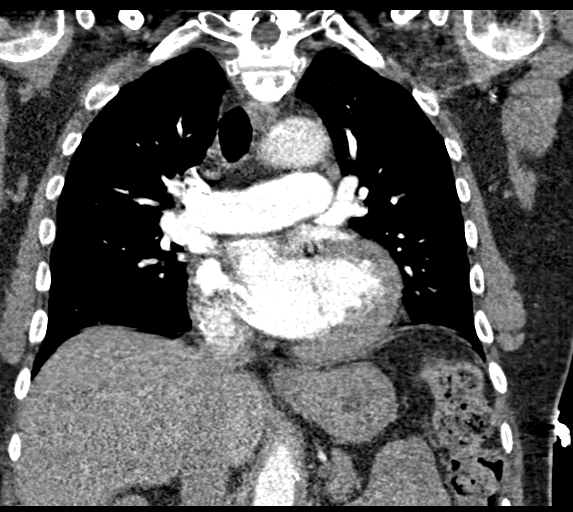
[im 109/145  soft-tissue]
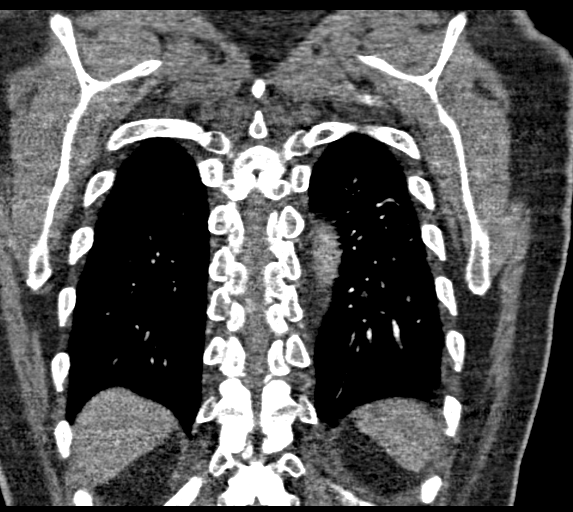

[18 of 46 positions shown; findings below may reference images not displayed]

FINDINGS: Cardiovascular: Minimal atherosclerotic calcification aorta.
Minimally dilated ascending thoracic aorta 4.0 cm diameter. No
evidence of aortic dissection. Heart unremarkable. No pericardial
effusion. Pulmonary arteries well opacified and patent. No evidence
of pulmonary embolism.

Mediastinum/Nodes: Esophagus unremarkable. Base of cervical region
normal appearance. No thoracic adenopathy.

Lungs/Pleura: 4 mm subpleural nodule RIGHT middle lobe image 51. 2
mm LEFT upper lobe nodule image 47. No acute infiltrate, pleural
effusion or pneumothorax.

Upper Abdomen: Cyst at upper pole RIGHT kidney 3.6 x 3.6 cm image
91. Adrenal thickening without mass. Remaining visualized upper
abdomen unremarkable.

Musculoskeletal: Scattered sclerotic foci within thoracic vertebra,
sternum, BILATERAL ribs, and RIGHT scapula consistent with osseous
metastases.

Review of the MIP images confirms the above findings.
IMPRESSION: No evidence of pulmonary embolism.

Scattered small sclerotic osseous foci suspicious for osseous
metastases from prostate cancer.

Small nonspecific BILATERAL pulmonary nodules.

RIGHT renal cyst.

Aneurysmal dilatation of the ascending thoracic aorta 4.0 cm
diameter, recommendation below.

Recommend annual imaging followup by CTA or MRA. This recommendation
follows 7939 ACCF/AHA/AATS/ACR/ASA/SCA/LIENAD/ANGELIE/ROSALEE/SACHI Guidelines
for the Diagnosis and Management of Patients with Thoracic Aortic
Disease. Circulation. 7939; 121: [id number]

Aortic Atherosclerosis (DFK4K-90Z.Z).

Aortic aneurysm NOS (DFK4K-9UH.D).

## 2019-12-20 IMAGING — CT CT HEAD W/O CM
4 series · 17 of 47 positions shown, 19 images · non-contrast
Comparison: CT brain 03/07/2017

CLINICAL DATA: Near syncopal episode, dizziness

EXAM:
CT HEAD WITHOUT CONTRAST
TECHNIQUE: Contiguous axial images were obtained from the base of the skull
through the vertex without intravenous contrast.

[Series 3: head without · axial · non-contrast · 0.46mm/px · z∈[-97,+38]mm · 7 of 37 slices shown, 9 images]
[im 5/37  brain]
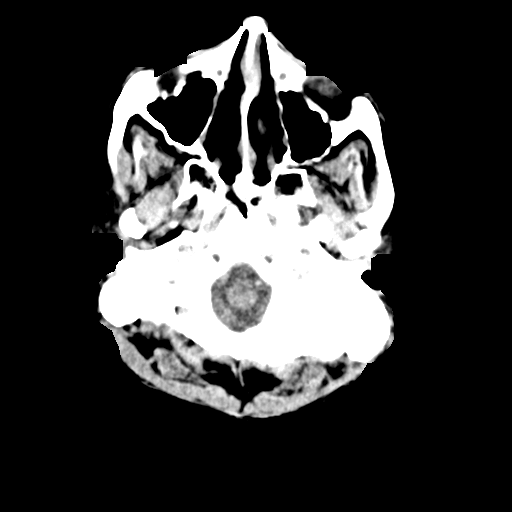
[im 5/37  bone]
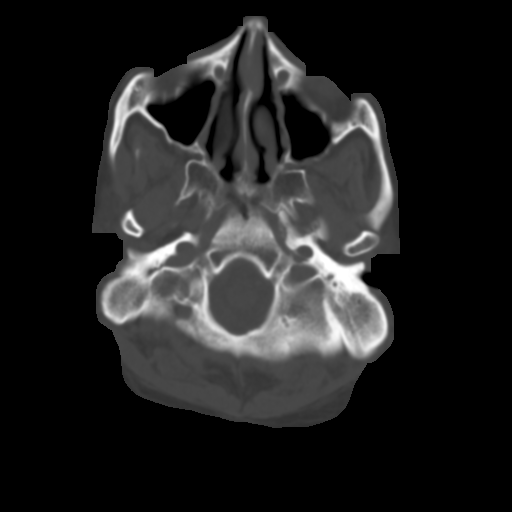
[im 10/37  brain]
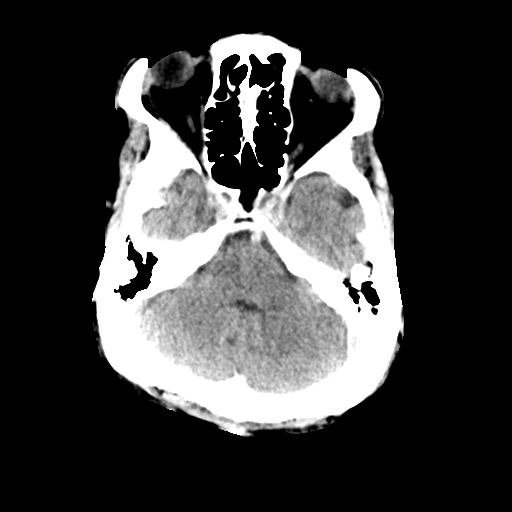
[im 14/37  brain]
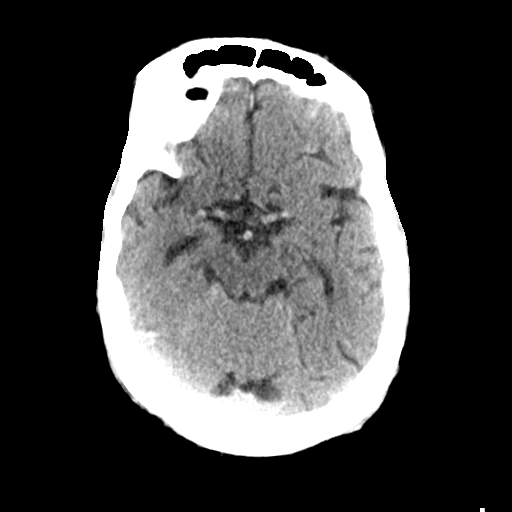
[im 19/37  brain]
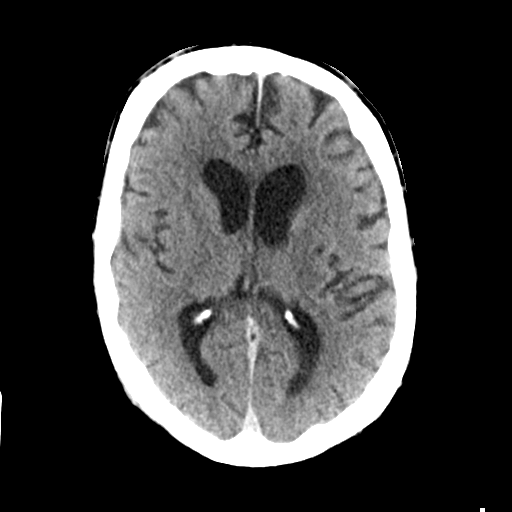
[im 23/37  brain]
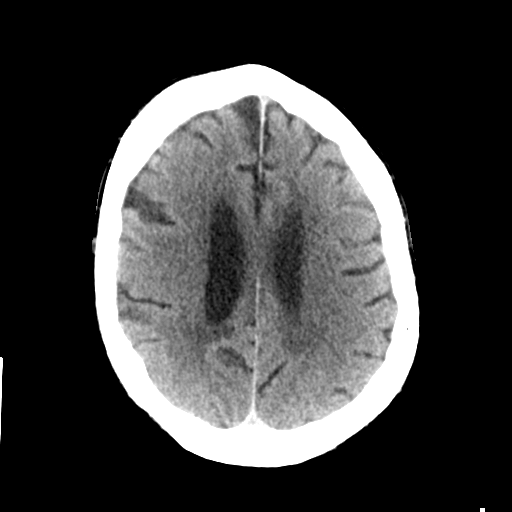
[im 23/37  bone]
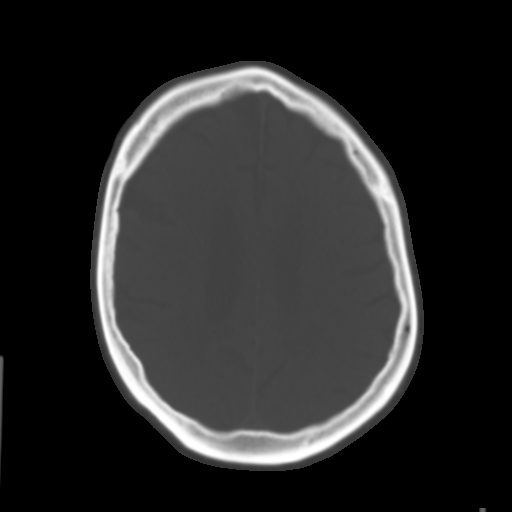
[im 28/37  brain]
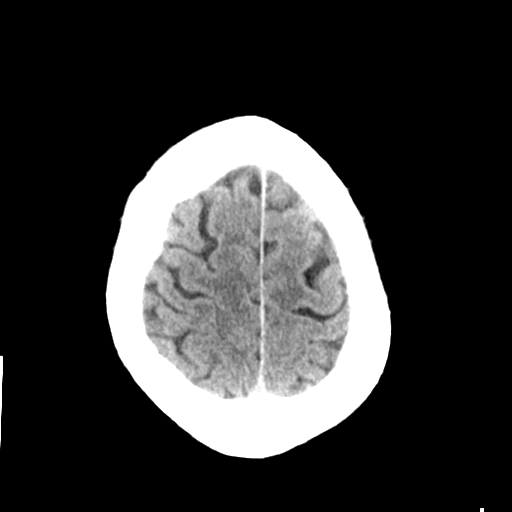
[im 32/37  brain]
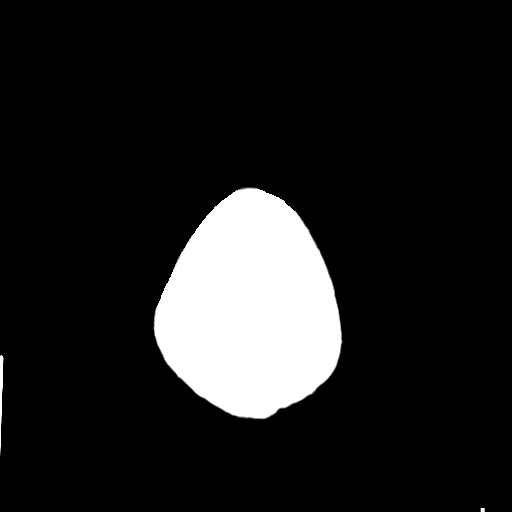

[Series 4: head bone · axial · 0.46mm/px · z∈[-99,-37]mm · 4 of 91 slices shown]
[im 10/91  bone]
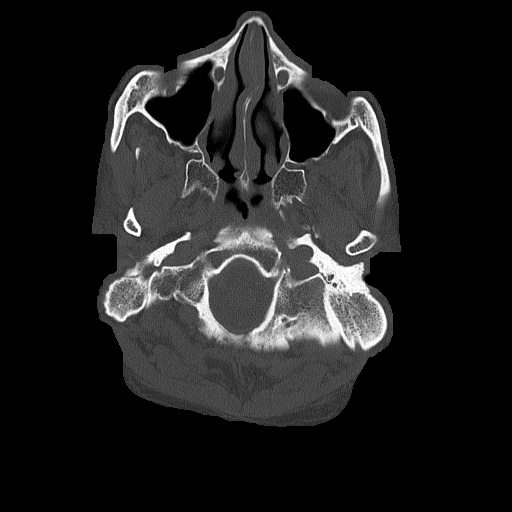
[im 19/91  bone]
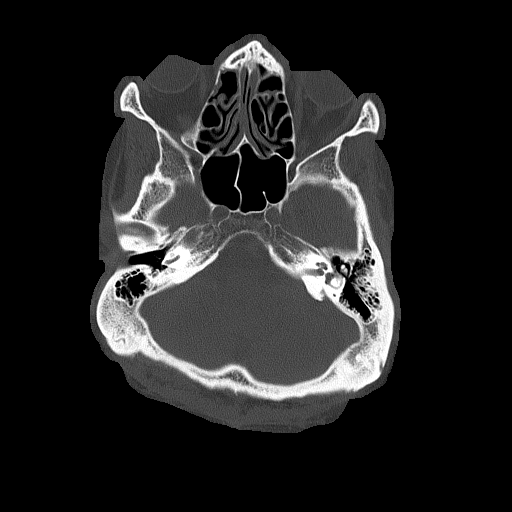
[im 28/91  bone]
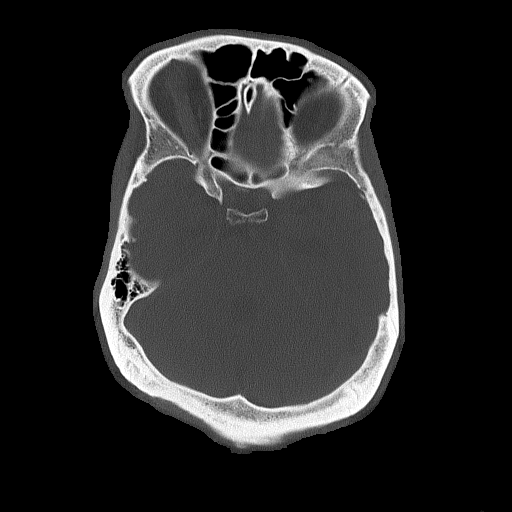
[im 41/91  bone]
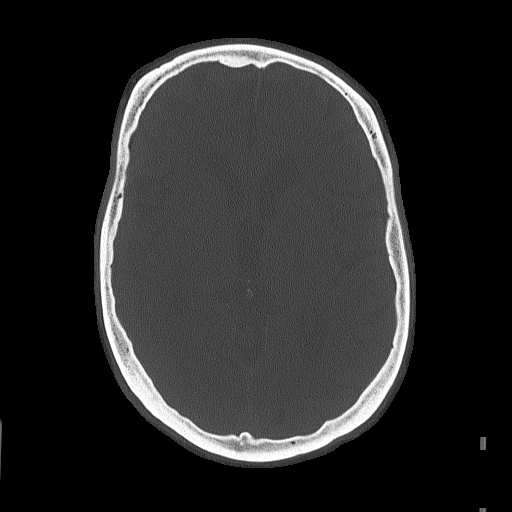

[Series 5: head without cor · coronal · non-contrast · 0.35mm/px · 3 of 74 slices shown]
[im 25/74  brain]
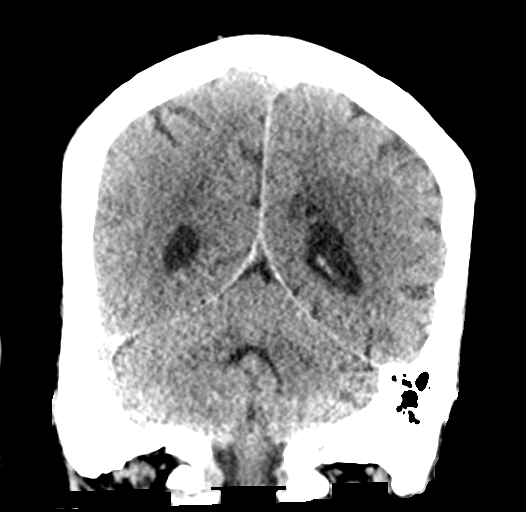
[im 33/74  brain]
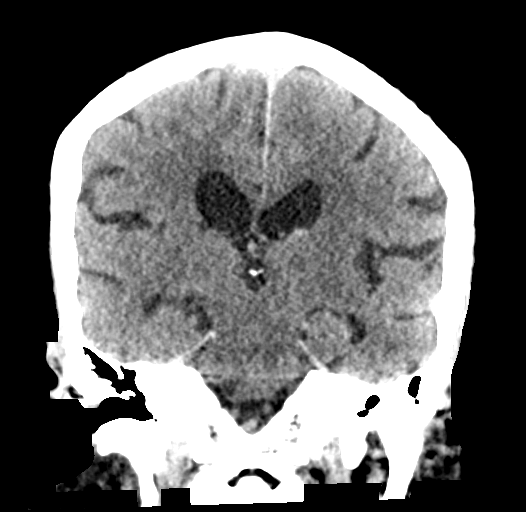
[im 41/74  brain]
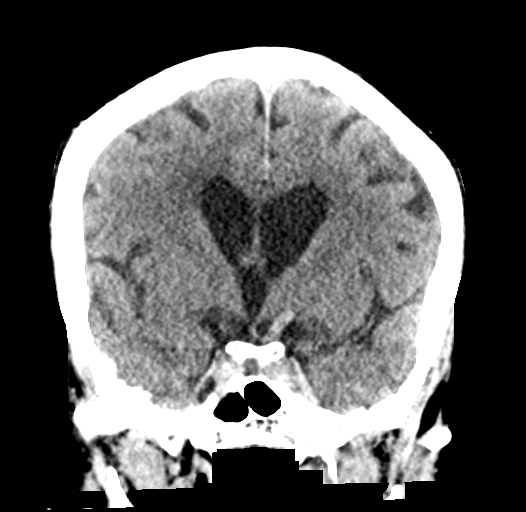

[Series 6: head without sag · sagittal · non-contrast · 0.37mm/px · 3 of 58 slices shown]
[im 20/58  brain]
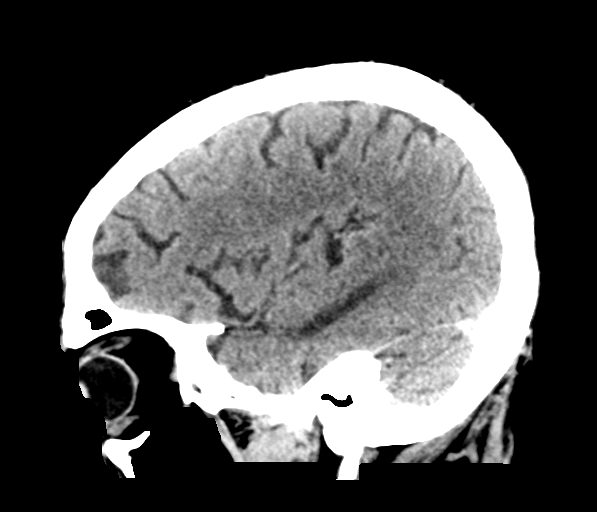
[im 29/58  brain]
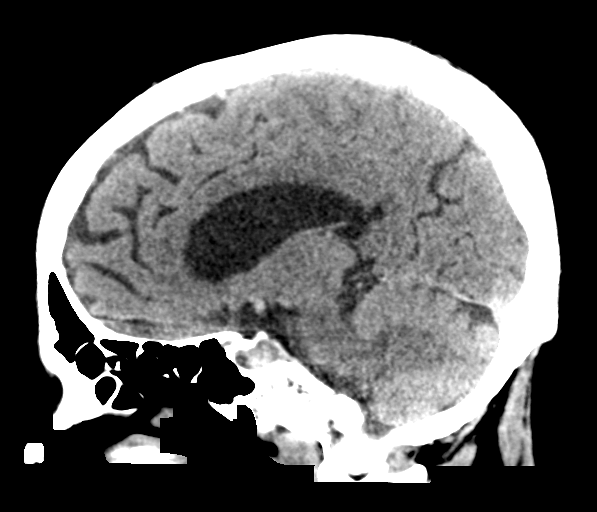
[im 39/58  brain]
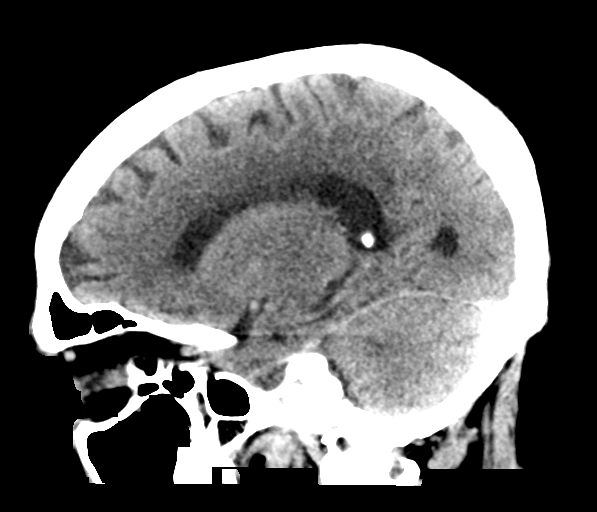

[17 of 47 positions shown; findings below may reference images not displayed]

FINDINGS: Brain: No acute territorial infarction, hemorrhage, or intracranial
mass is visualized. Mild small vessel ischemic changes of the white
matter. Mild to moderate atrophy. Stable mildly prominent ventricle
size.

Vascular: No hyperdense vessels. Scattered calcifications at the
carotid siphons.

Skull: Normal. Negative for fracture or focal lesion.

Sinuses/Orbits: Mucosal thickening in the ethmoid and maxillary
sinuses. Small mucous retention cyst in the left maxillary sinus.

Other: None
IMPRESSION: 1. No CT evidence for acute intracranial abnormality.
2. Atrophy and mild small vessel ischemic changes of the white
matter

## 2021-06-09 ENCOUNTER — Encounter (HOSPITAL_COMMUNITY): Payer: Self-pay

## 2021-06-09 ENCOUNTER — Emergency Department (HOSPITAL_COMMUNITY): Payer: No Typology Code available for payment source

## 2021-06-09 ENCOUNTER — Inpatient Hospital Stay (HOSPITAL_COMMUNITY)
Admission: EM | Admit: 2021-06-09 | Discharge: 2021-06-13 | DRG: 558 | Disposition: A | Discharge status: Skilled Nursing Facility | Payer: No Typology Code available for payment source | Attending: Internal Medicine | Admitting: Internal Medicine

## 2021-06-09 ENCOUNTER — Other Ambulatory Visit: Payer: Self-pay

## 2021-06-09 DIAGNOSIS — Z79899 Other long term (current) drug therapy: Secondary | ICD-10-CM

## 2021-06-09 DIAGNOSIS — I252 Old myocardial infarction: Secondary | ICD-10-CM

## 2021-06-09 DIAGNOSIS — G8929 Other chronic pain: Secondary | ICD-10-CM | POA: Diagnosis present

## 2021-06-09 DIAGNOSIS — W06XXXA Fall from bed, initial encounter: Secondary | ICD-10-CM | POA: Diagnosis present

## 2021-06-09 DIAGNOSIS — Z8249 Family history of ischemic heart disease and other diseases of the circulatory system: Secondary | ICD-10-CM

## 2021-06-09 DIAGNOSIS — Z20822 Contact with and (suspected) exposure to covid-19: Secondary | ICD-10-CM | POA: Diagnosis present

## 2021-06-09 DIAGNOSIS — E119 Type 2 diabetes mellitus without complications: Secondary | ICD-10-CM | POA: Diagnosis present

## 2021-06-09 DIAGNOSIS — R7401 Elevation of levels of liver transaminase levels: Secondary | ICD-10-CM | POA: Diagnosis present

## 2021-06-09 DIAGNOSIS — R52 Pain, unspecified: Secondary | ICD-10-CM

## 2021-06-09 DIAGNOSIS — R531 Weakness: Secondary | ICD-10-CM

## 2021-06-09 DIAGNOSIS — M6282 Rhabdomyolysis: Principal | ICD-10-CM | POA: Diagnosis present

## 2021-06-09 DIAGNOSIS — M549 Dorsalgia, unspecified: Secondary | ICD-10-CM | POA: Diagnosis present

## 2021-06-09 DIAGNOSIS — E860 Dehydration: Secondary | ICD-10-CM | POA: Diagnosis present

## 2021-06-09 DIAGNOSIS — Z8589 Personal history of malignant neoplasm of other organs and systems: Secondary | ICD-10-CM

## 2021-06-09 DIAGNOSIS — N19 Unspecified kidney failure: Secondary | ICD-10-CM | POA: Diagnosis present

## 2021-06-09 DIAGNOSIS — I447 Left bundle-branch block, unspecified: Secondary | ICD-10-CM | POA: Diagnosis present

## 2021-06-09 DIAGNOSIS — N179 Acute kidney failure, unspecified: Secondary | ICD-10-CM

## 2021-06-09 DIAGNOSIS — N400 Enlarged prostate without lower urinary tract symptoms: Secondary | ICD-10-CM | POA: Diagnosis present

## 2021-06-09 DIAGNOSIS — I10 Essential (primary) hypertension: Secondary | ICD-10-CM | POA: Diagnosis present

## 2021-06-09 DIAGNOSIS — Y92003 Bedroom of unspecified non-institutional (private) residence as the place of occurrence of the external cause: Secondary | ICD-10-CM

## 2021-06-09 DIAGNOSIS — Z7984 Long term (current) use of oral hypoglycemic drugs: Secondary | ICD-10-CM

## 2021-06-09 DIAGNOSIS — Z86711 Personal history of pulmonary embolism: Secondary | ICD-10-CM

## 2021-06-09 DIAGNOSIS — Z8673 Personal history of transient ischemic attack (TIA), and cerebral infarction without residual deficits: Secondary | ICD-10-CM

## 2021-06-09 DIAGNOSIS — E785 Hyperlipidemia, unspecified: Secondary | ICD-10-CM | POA: Diagnosis present

## 2021-06-09 DIAGNOSIS — Z7901 Long term (current) use of anticoagulants: Secondary | ICD-10-CM

## 2021-06-09 DIAGNOSIS — Z8546 Personal history of malignant neoplasm of prostate: Secondary | ICD-10-CM

## 2021-06-09 DIAGNOSIS — Z85528 Personal history of other malignant neoplasm of kidney: Secondary | ICD-10-CM

## 2021-06-09 DIAGNOSIS — R7989 Other specified abnormal findings of blood chemistry: Secondary | ICD-10-CM | POA: Diagnosis present
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
## 2021-06-09 NOTE — ED Triage Notes (Signed)
Pt BIB [facility] from home c/o generalized body aches and flu like symptoms.
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
## 2021-06-09 NOTE — ED Provider Notes (Signed)
[facility] EMERGENCY DEPARTMENT Provider Note   CSN: [id number] Arrival date & time: 06/09/21  1247     History  Chief Complaint  Patient presents with   Generalized Body Aches    John Shields is a 76 y.o. male.  HPI Patient presents with concern of diffuse discomfort, low back pain, bilateral lower extremity pain.  This latter option seems chronic, but the others seem relatively new.  Patient's history is notable for PE, metastatic prostate cancer.  He notes that prior to calling EMS he spent approximately 12 hours on the floor after not being able to some of the strength to get back in bed.  He did not actually fall, but felt weak, lowering himself prior to that time..  Additional details are obtained on chart review from his discharge summary of 2 months ago, including notation of metastatic prostate cancer, plan outpatient follow-up.  Is unclear if that has occurred.    Home Medications Prior to Admission medications   Medication Sig Start Date End Date Taking? Authorizing Provider  apixaban (ELIQUIS) 5 MG TABS tablet Take 1 tablet (5 mg total) by mouth 2 (two) times daily. 04/07/19   Nita Sells, MD  atorvastatin (LIPITOR) 80 MG tablet Take 1 tablet (80 mg total) by mouth daily at 6 PM. Patient taking differently: Take 80 mg by mouth at bedtime.  09/03/17   Jani Gravel, MD  b complex vitamins tablet Take 1 tablet by mouth daily.    [provider]  metFORMIN (GLUCOPHAGE) 500 MG tablet Take 1 tablet (500 mg total) by mouth daily with breakfast. Patient taking differently: Take 500 mg by mouth 2 (two) times daily with a meal.  03/22/19 03/21/20  Eulogio Bear U, DO  metoprolol tartrate (LOPRESSOR) 25 MG tablet Take 25 mg by mouth daily.    [provider]  oxyCODONE-acetaminophen (PERCOCET/ROXICET) 5-325 MG tablet Take 1 tablet by mouth every 4 (four) hours as needed for moderate pain. 04/07/19   Nita Sells, MD  tamsulosin  (FLOMAX) 0.4 MG CAPS capsule Take 0.4 mg by mouth daily.     [provider]      Allergies    Patient has no known allergies.    Review of Systems   Review of Systems  Constitutional:        Per HPI, otherwise negative  HENT:         Per HPI, otherwise negative  Respiratory:         Per HPI, otherwise negative  Cardiovascular:        Per HPI, otherwise negative  Gastrointestinal:  Negative for vomiting.  Endocrine:       Negative aside from HPI  Genitourinary:        Neg aside from HPI   Musculoskeletal:        Per HPI, otherwise negative  Skin: Negative.   Neurological:  Negative for syncope.   Physical Exam Updated Vital Signs BP 135/78 (BP Location: Left Arm)    Pulse 88    Temp 98.4 F (36.9 C)    Resp 18    SpO2 96%  Physical Exam Vitals and nursing note reviewed.  Constitutional:      General: He is not in acute distress.    Appearance: He is well-developed.  HENT:     Head: Normocephalic and atraumatic.  Eyes:     Conjunctiva/sclera: Conjunctivae normal.  Cardiovascular:     Rate and Rhythm: Normal rate and regular rhythm.  Pulmonary:  Effort: Pulmonary effort is normal. No respiratory distress.     Breath sounds: No stridor.  Abdominal:     General: There is no distension.  Musculoskeletal:        General: No deformity.     Right lower leg: Edema present.     Left lower leg: Edema present.  Skin:    General: Skin is warm and dry.  Neurological:     Mental Status: He is alert and oriented to person, place, and time.    ED Results / Procedures / Treatments   Labs (all labs ordered are listed, but only abnormal results are displayed) Labs Reviewed  RESP PANEL BY RT-PCR (FLU A&B, COVID) [id number]  COMPREHENSIVE METABOLIC PANEL  BRAIN NATRIURETIC PEPTIDE  CBC WITH DIFFERENTIAL/PLATELET  URINALYSIS, ROUTINE W REFLEX MICROSCOPIC  CK    EKG None  Radiology DG Chest 1 View  Result Date: 06/09/2021 CLINICAL DATA:  Pain EXAM: CHEST  1  VIEW COMPARISON:  04/04/2019 FINDINGS: Heart is borderline in size. Lungs clear. No effusions, edema or pneumothorax. No acute bony abnormality. IMPRESSION: No active disease. Electronically Signed   By: Rolm Baptise M.D.   On: 06/09/2021 23:10   DG Lumbar Spine Complete  Result Date: 06/09/2021 CLINICAL DATA:  Prostate cancer.  New low back pain. EXAM: LUMBAR SPINE - COMPLETE 4+ VIEW COMPARISON:  None. FINDINGS: Normal alignment. No fracture. Diffuse degenerative changes with disc space narrowing and anterior spurring. Diffuse degenerative facet disease. No fracture or malalignment. No focal bone lesion. IMPRESSION: Degenerative changes.  No acute bony abnormality. Electronically Signed   By: Rolm Baptise M.D.   On: 06/09/2021 23:09   DG Knee Complete 4 Views Left  Result Date: 06/09/2021 CLINICAL DATA:  joint pain EXAM: LEFT KNEE - COMPLETE 4+ VIEW COMPARISON:  None. FINDINGS: Normal alignment. No acute fracture. Mild-to-moderate tricompartmental degenerative changes. Bulky patellar enthesophytes. Vascular calcifications. No knee joint effusion. IMPRESSION: No acute osseous abnormality. Mild-to-moderate tricompartmental degenerative changes with bulky patellar enthesophytes. Electronically Signed   By: Albin Felling M.D.   On: 06/09/2021 14:24   DG Knee Complete 4 Views Right  Result Date: 06/09/2021 CLINICAL DATA:  joint pain EXAM: RIGHT KNEE - COMPLETE 4+ VIEW COMPARISON:  None. FINDINGS: Normal alignment. No acute fracture. Mild-to-moderate tricompartmental degenerative changes. Bulky patellar enthesophytes. No knee joint effusion. Vascular calcifications. IMPRESSION: No acute osseous abnormality. Mild-to-moderate tricompartmental degenerative changes with bulky patellar enthesophytes. Electronically Signed   By: Albin Felling M.D.   On: 06/09/2021 14:26    Procedures Procedures    Medications Ordered in ED Medications - No data to display  ED Course/ Medical Decision Making/ A&P Adult  male multiple medical issues including prostate cancer, PE presents after falling.  Being down, but not following a fall, now with weakness, low back pain.  Differential including progression of cancer versus recurrence of pulmonary embolism versus intrathoracic or intraperitoneal bleeding with subsequent anemia considered.  Initial studies performed after triage reassuring with no evidence for any acute abnormalities, nor lumbar spine pathology.  I have interpreted these results. Patient is awake and alert, but with weakness, back pain, anticipation for admission is discussed on signout.  On signout patient is awaiting labs. On chart review, patient is found to have acute kidney injury, elevated CK, given his weakness, was admitted for resuscitation, monitoring, management.  I discussed this case with our oncoming ER physician who executed the completion of his care and admission.  Medical Decision Making Amount and/or Complexity of Data Reviewed Labs: ordered. Radiology: ordered.  Risk Decision regarding hospitalization.           Final Clinical Impression(s) / ED Diagnoses Final diagnoses:  Pain     Carmin Muskrat, MD 06/15/21 [phone]

## 2021-06-10 ENCOUNTER — Encounter (HOSPITAL_COMMUNITY): Payer: Self-pay | Admitting: Internal Medicine

## 2021-06-10 DIAGNOSIS — Z8589 Personal history of malignant neoplasm of other organs and systems: Secondary | ICD-10-CM | POA: Diagnosis not present

## 2021-06-10 DIAGNOSIS — R7989 Other specified abnormal findings of blood chemistry: Secondary | ICD-10-CM | POA: Diagnosis present

## 2021-06-10 DIAGNOSIS — Z79899 Other long term (current) drug therapy: Secondary | ICD-10-CM | POA: Diagnosis not present

## 2021-06-10 DIAGNOSIS — I252 Old myocardial infarction: Secondary | ICD-10-CM | POA: Diagnosis not present

## 2021-06-10 DIAGNOSIS — N400 Enlarged prostate without lower urinary tract symptoms: Secondary | ICD-10-CM | POA: Diagnosis present

## 2021-06-10 DIAGNOSIS — R531 Weakness: Secondary | ICD-10-CM | POA: Diagnosis not present

## 2021-06-10 DIAGNOSIS — W06XXXA Fall from bed, initial encounter: Secondary | ICD-10-CM | POA: Diagnosis present

## 2021-06-10 DIAGNOSIS — T796XXA Traumatic ischemia of muscle, initial encounter: Secondary | ICD-10-CM

## 2021-06-10 DIAGNOSIS — Z20822 Contact with and (suspected) exposure to covid-19: Secondary | ICD-10-CM | POA: Diagnosis present

## 2021-06-10 DIAGNOSIS — Z7984 Long term (current) use of oral hypoglycemic drugs: Secondary | ICD-10-CM | POA: Diagnosis not present

## 2021-06-10 DIAGNOSIS — R338 Other retention of urine: Secondary | ICD-10-CM

## 2021-06-10 DIAGNOSIS — E119 Type 2 diabetes mellitus without complications: Secondary | ICD-10-CM | POA: Diagnosis present

## 2021-06-10 DIAGNOSIS — M6282 Rhabdomyolysis: Secondary | ICD-10-CM | POA: Diagnosis present

## 2021-06-10 DIAGNOSIS — Z8249 Family history of ischemic heart disease and other diseases of the circulatory system: Secondary | ICD-10-CM | POA: Diagnosis not present

## 2021-06-10 DIAGNOSIS — Z7901 Long term (current) use of anticoagulants: Secondary | ICD-10-CM | POA: Diagnosis not present

## 2021-06-10 DIAGNOSIS — E785 Hyperlipidemia, unspecified: Secondary | ICD-10-CM | POA: Diagnosis present

## 2021-06-10 DIAGNOSIS — E860 Dehydration: Secondary | ICD-10-CM

## 2021-06-10 DIAGNOSIS — Z8546 Personal history of malignant neoplasm of prostate: Secondary | ICD-10-CM | POA: Diagnosis not present

## 2021-06-10 DIAGNOSIS — I447 Left bundle-branch block, unspecified: Secondary | ICD-10-CM | POA: Diagnosis present

## 2021-06-10 DIAGNOSIS — G8929 Other chronic pain: Secondary | ICD-10-CM | POA: Diagnosis present

## 2021-06-10 DIAGNOSIS — I10 Essential (primary) hypertension: Secondary | ICD-10-CM | POA: Diagnosis present

## 2021-06-10 DIAGNOSIS — N401 Enlarged prostate with lower urinary tract symptoms: Secondary | ICD-10-CM

## 2021-06-10 DIAGNOSIS — Z8673 Personal history of transient ischemic attack (TIA), and cerebral infarction without residual deficits: Secondary | ICD-10-CM | POA: Diagnosis not present

## 2021-06-10 DIAGNOSIS — Z85528 Personal history of other malignant neoplasm of kidney: Secondary | ICD-10-CM | POA: Diagnosis not present

## 2021-06-10 DIAGNOSIS — M549 Dorsalgia, unspecified: Secondary | ICD-10-CM | POA: Diagnosis present

## 2021-06-10 DIAGNOSIS — N19 Unspecified kidney failure: Secondary | ICD-10-CM | POA: Diagnosis present

## 2021-06-10 DIAGNOSIS — Z86711 Personal history of pulmonary embolism: Secondary | ICD-10-CM | POA: Diagnosis not present

## 2021-06-10 DIAGNOSIS — R7401 Elevation of levels of liver transaminase levels: Secondary | ICD-10-CM | POA: Diagnosis present

## 2021-06-10 DIAGNOSIS — Y92003 Bedroom of unspecified non-institutional (private) residence as the place of occurrence of the external cause: Secondary | ICD-10-CM | POA: Diagnosis not present

## 2021-06-10 HISTORY — DX: Rhabdomyolysis: M62.82

## 2021-06-10 LAB — URINALYSIS, MICROSCOPIC (REFLEX): Bacteria, UA: NONE SEEN

## 2021-06-10 LAB — CBC WITH DIFFERENTIAL/PLATELET
Abs Immature Granulocytes: 0.03 10*3/uL (ref 0.00–0.07)
Basophils Absolute: 0 10*3/uL (ref 0.0–0.1)
Basophils Relative: 0 %
Eosinophils Absolute: 0.1 10*3/uL (ref 0.0–0.5)
Eosinophils Relative: 1 %
HCT: 42.3 % (ref 39.0–52.0)
Hemoglobin: 13.1 g/dL (ref 13.0–17.0)
Immature Granulocytes: 0 %
Lymphocytes Relative: 29 %
Lymphs Abs: 2.9 10*3/uL (ref 0.7–4.0)
MCH: 22.7 pg — ABNORMAL LOW (ref 26.0–34.0)
MCHC: 31 g/dL (ref 30.0–36.0)
MCV: 73.4 fL — ABNORMAL LOW (ref 80.0–100.0)
Monocytes Absolute: 1.1 10*3/uL — ABNORMAL HIGH (ref 0.1–1.0)
Monocytes Relative: 12 %
Neutro Abs: 5.7 10*3/uL (ref 1.7–7.7)
Neutrophils Relative %: 58 %
Platelets: 214 10*3/uL (ref 150–400)
RBC: 5.76 MIL/uL (ref 4.22–5.81)
RDW: 15.1 % (ref 11.5–15.5)
WBC: 9.9 10*3/uL (ref 4.0–10.5)
nRBC: 0 % (ref 0.0–0.2)

## 2021-06-10 LAB — COMPREHENSIVE METABOLIC PANEL
ALT: 36 U/L (ref 0–44)
ALT: 30 U/L (ref 0–44)
AST: 67 U/L — ABNORMAL HIGH (ref 15–41)
AST: 61 U/L — ABNORMAL HIGH (ref 15–41)
Albumin: 4 g/dL (ref 3.5–5.0)
Albumin: 3.5 g/dL (ref 3.5–5.0)
Alkaline Phosphatase: 53 U/L (ref 38–126)
Alkaline Phosphatase: 48 U/L (ref 38–126)
Anion gap: 7 (ref 5–15)
Anion gap: 7 (ref 5–15)
BUN: 27 mg/dL — ABNORMAL HIGH (ref 8–23)
BUN: 26 mg/dL — ABNORMAL HIGH (ref 8–23)
CO2: 25 mmol/L (ref 22–32)
CO2: 21 mmol/L — ABNORMAL LOW (ref 22–32)
Calcium: 8.8 mg/dL — ABNORMAL LOW (ref 8.9–10.3)
Calcium: 8.3 mg/dL — ABNORMAL LOW (ref 8.9–10.3)
Chloride: 106 mmol/L (ref 98–111)
Chloride: 110 mmol/L (ref 98–111)
Creatinine, Ser: 1.18 mg/dL (ref 0.61–1.24)
Creatinine, Ser: 1.09 mg/dL (ref 0.61–1.24)
GFR, Estimated: >60 mL/min (ref >60)
GFR, Estimated: >60 mL/min (ref >60)
Glucose, Bld: 111 mg/dL — ABNORMAL HIGH (ref 70–99)
Glucose, Bld: 100 mg/dL — ABNORMAL HIGH (ref 70–99)
Potassium: 4.2 mmol/L (ref 3.5–5.1)
Potassium: 4.8 mmol/L (ref 3.5–5.1)
Sodium: 138 mmol/L (ref 135–145)
Sodium: 138 mmol/L (ref 135–145)
Total Bilirubin: 0.9 mg/dL (ref 0.3–1.2)
Total Bilirubin: 0.7 mg/dL (ref 0.3–1.2)
Total Protein: 7.2 g/dL (ref 6.5–8.1)
Total Protein: 6.6 g/dL (ref 6.5–8.1)

## 2021-06-10 LAB — RESP PANEL BY RT-PCR (FLU A&B, COVID) ARPGX2
Influenza A by PCR: NEGATIVE
Influenza B by PCR: NEGATIVE
SARS Coronavirus 2 by RT PCR: NEGATIVE

## 2021-06-10 LAB — CBC
HCT: 42.3 % (ref 39.0–52.0)
Hemoglobin: 12.9 g/dL — ABNORMAL LOW (ref 13.0–17.0)
MCH: 22.8 pg — ABNORMAL LOW (ref 26.0–34.0)
MCHC: 30.5 g/dL (ref 30.0–36.0)
MCV: 74.9 fL — ABNORMAL LOW (ref 80.0–100.0)
Platelets: 191 10*3/uL (ref 150–400)
RBC: 5.65 MIL/uL (ref 4.22–5.81)
RDW: 15.9 % — ABNORMAL HIGH (ref 11.5–15.5)
WBC: 9.9 10*3/uL (ref 4.0–10.5)
nRBC: 0 % (ref 0.0–0.2)

## 2021-06-10 LAB — URINALYSIS, ROUTINE W REFLEX MICROSCOPIC
Bilirubin Urine: NEGATIVE
Glucose, UA: NEGATIVE mg/dL
Ketones, ur: NEGATIVE mg/dL
Leukocytes,Ua: NEGATIVE
Nitrite: NEGATIVE
Protein, ur: 30 mg/dL — AB
Specific Gravity, Urine: >1.03 — ABNORMAL HIGH (ref 1.005–1.030)
pH: 5.5 (ref 5.0–8.0)

## 2021-06-10 LAB — MAGNESIUM: Magnesium: 2.3 mg/dL (ref 1.7–2.4)

## 2021-06-10 LAB — GLUCOSE, CAPILLARY
Glucose-Capillary: 99 mg/dL (ref 70–99)
Glucose-Capillary: 110 mg/dL — ABNORMAL HIGH (ref 70–99)

## 2021-06-10 LAB — PHOSPHORUS: Phosphorus: 3.5 mg/dL (ref 2.5–4.6)

## 2021-06-10 LAB — TSH: TSH: 0.671 u[IU]/mL (ref 0.350–4.500)

## 2021-06-10 LAB — BRAIN NATRIURETIC PEPTIDE: B Natriuretic Peptide: 16.7 pg/mL (ref 0.0–100.0)

## 2021-06-10 LAB — CBG MONITORING, ED
Glucose-Capillary: 92 mg/dL (ref 70–99)
Glucose-Capillary: 119 mg/dL — ABNORMAL HIGH (ref 70–99)

## 2021-06-10 LAB — CK
Total CK: 1479 U/L — ABNORMAL HIGH (ref 49–397)
Total CK: 1140 U/L — ABNORMAL HIGH (ref 49–397)

## 2021-06-10 MED ORDER — SODIUM CHLORIDE 0.9 % IV BOLUS
1000.0000 mL | Freq: Once | INTRAVENOUS | Status: AC
  Administered 2021-06-10: 1000 mL via INTRAVENOUS

## 2021-06-10 MED ORDER — INSULIN ASPART 100 UNIT/ML IJ SOLN: 0.0000 [IU] | Freq: Three times a day (TID) | INTRAMUSCULAR | Status: DC

## 2021-06-10 MED ORDER — APIXABAN 5 MG PO TABS
5.0000 mg | ORAL_TABLET | Freq: Two times a day (BID) | ORAL | Status: DC
  Administered 2021-06-10 – 2021-06-13 (×8): 5 mg via ORAL
  Filled 2021-06-10 (×8): qty 1

## 2021-06-10 MED ORDER — TAMSULOSIN HCL 0.4 MG PO CAPS
0.4000 mg | ORAL_CAPSULE | Freq: Every day | ORAL | Status: DC
  Administered 2021-06-10 – 2021-06-13 (×4): 0.4 mg via ORAL
  Filled 2021-06-10 (×4): qty 1

## 2021-06-10 MED ORDER — SODIUM CHLORIDE 0.9 % IV SOLN: INTRAVENOUS | Status: DC

## 2021-06-10 MED ORDER — METOPROLOL TARTRATE 25 MG PO TABS
25.0000 mg | ORAL_TABLET | Freq: Every day | ORAL | Status: DC
  Administered 2021-06-10 – 2021-06-13 (×4): 25 mg via ORAL
  Filled 2021-06-10 (×4): qty 1

## 2021-06-10 MED ORDER — ACETAMINOPHEN 325 MG PO TABS: 650.0000 mg | ORAL_TABLET | Freq: Four times a day (QID) | ORAL | Status: DC | PRN

## 2021-06-10 MED ORDER — ACETAMINOPHEN 650 MG RE SUPP: 650.0000 mg | Freq: Four times a day (QID) | RECTAL | Status: DC | PRN

## 2021-06-10 NOTE — Plan of Care (Signed)
°  Problem: Education: Goal: Knowledge of General Education information will improve Description: Including pain rating scale, medication(s)/side effects and non-pharmacologic comfort measures Outcome: Progressing   Problem: Health Behavior/Discharge Planning: Goal: Ability to manage health-related needs will improve Outcome: Progressing   Problem: Clinical Measurements: Goal: Ability to maintain clinical measurements within normal limits will improve Outcome: Progressing   Problem: Activity: Goal: Risk for activity intolerance will decrease Outcome: Progressing   Problem: Nutrition: Goal: Adequate nutrition will be maintained Outcome: Progressing   Problem: Coping: Goal: Level of anxiety will decrease Outcome: Progressing   Problem: Safety: Goal: Ability to remain free from injury will improve Outcome: Progressing   

## 2021-06-10 NOTE — ED Provider Notes (Signed)
I assumed care at signout to follow-up on labs.  Labs revealed dehydration and elevated CK with mild rhabdomyolysis.  Patient reports he laid in the floor for about 12 hours.  He reports generalized weakness.  On exam, he is resting comfortably no acute distress.  No obvious deformities to his lower extremities.  He is able to move his legs but limited due to pain.  He reports most of his issues are chronic.  No trauma reported that he reports he slid down into the floor and did not hit his head, no LOC Patient will be admitted.  He reports he cannot ambulate. Discussed with Dr. Eugenia Pancoast for admission   Ripley Fraise, MD 06/10/21 [phone]

## 2021-06-10 NOTE — Progress Notes (Signed)
Physical Therapy Evaluation Patient Details Name: John Shields MRN: [id number] DOB: 23-Nov-1945 Today's Date: 06/10/2021  History of Present Illness  John Shields is a 76 y/o attmitted to the ED with rhabdomyolysis and generalized weakness. Pt reports falling out of his bed and staying on the floor for 12 hours. He was able to crawl on the floor to the phone to call EMS. He has a past medical history of DM, Sepsis, HTN, MI, prostate cancer, CVA.   Clinical Impression  Pt presents with diagnosis above an and deficits below. Pt had difficulty with mobility tasks such as transfers and gait, as well as, cognitive deficits. Patient requiring min assist for mobility tasks and demonstrated difficulty sequencing. During physical exam patient showed deficits in strength, endurance, activity tolerance. Patient currently lives alone and is at increase risk for falls. Recommending therapy services at SNF to address the previously stated deficits. Will continue to follow acutely to maximize functional mobility, independence and safety.      Recommendations for follow up therapy are one component of a multi-disciplinary discharge planning process, led by the attending physician.  Recommendations may be updated based on patient status, additional functional criteria and insurance authorization.  Follow Up Recommendations Skilled nursing-short term rehab (<3 hours/day)    Assistance Recommended at Discharge Frequent or constant Supervision/Assistance  Patient can return home with the following  A little help with walking and/or transfers;A little help with bathing/dressing/bathroom;Direct supervision/assist for financial management;Direct supervision/assist for medications management;Assistance with cooking/housework;Help with stairs or ramp for entrance;Assist for transportation    Equipment Recommendations Rolling walker (2 wheels)  Recommendations for Other Services       Functional Status Assessment  Patient has had a recent decline in their functional status and demonstrates the ability to make significant improvements in function in a reasonable and predictable amount of time.     Precautions / Restrictions Precautions Precautions: Fall Restrictions Weight Bearing Restrictions: No      Mobility  Bed Mobility Overal bed mobility: Needs Assistance Bed Mobility: Supine to Sit;Sit to Supine     Supine to sit: +2 for physical assistance;+2 for safety/equipment;Min assist Sit to supine: Min assist;+2 for physical assistance;+2 for safety/equipment;Mod assist   General bed mobility comments: Patient needed assistance with BLE and trunk elevation. He required verbal, step by step cuing in order to get to sitting on EOB.    Transfers Overall transfer level: Needs assistance Equipment used: 2 person hand held assist Transfers: Sit to/from Stand Sit to Stand: Min assist;+2 physical assistance;+2 safety/equipment           General transfer comment: Min A for lift assist and steadying to stand.    Ambulation/Gait Ambulation/Gait assistance: Min assist;+2 safety/equipment Gait Distance (Feet): 25 Feet Assistive device: 2 person hand held assist Gait Pattern/deviations: Step-to pattern;Step-through pattern;Decreased stride length;Shuffle Gait velocity: Decreased     General Gait Details: Pt reported decreased confidence with gait and required BUE support. Short shuffled steps and had LOB X1-2 throughout. Min A for steadying. He was able to take small side steps but required multimodal cues to perform.  Stairs            Wheelchair Mobility    Modified Rankin (Stroke Patients Only)       Balance Overall balance assessment: Needs assistance Sitting-balance support: No upper extremity supported;Feet supported Sitting balance-Leahy Scale: Fair Sitting balance - Comments: statically, limited dynamically   Standing balance support: During functional  activity;Bilateral upper extremity supported Standing balance-Leahy Scale: Poor Standing balance  comment: Reliant on BUE and external support                             Pertinent Vitals/Pain Pain Assessment: Faces Faces Pain Scale: Hurts a little bit Pain Location: Low back Pain Descriptors / Indicators: Discomfort;Aching;Grimacing Pain Intervention(s): Limited activity within patient's tolerance;Monitored during session;Repositioned    Home Living Family/patient expects to be discharged to:: Private residence Living Arrangements: Alone Available Help at Discharge: Friend(s);Available PRN/intermittently Type of Home: Apartment Home Access: Level entry       Home Layout: One level Home Equipment: None      Prior Function Prior Level of Function : Independent/Modified Independent;Needs assist             Mobility Comments: reports furniture walking at home ADLs Comments: Patient reports taking 45 minutes to get dressed if he is leaving the house, but reports he is independent     Hand Dominance   Dominant Hand: Left    Extremity/Trunk Assessment   Upper Extremity Assessment Upper Extremity Assessment: Defer to OT evaluation    Lower Extremity Assessment Lower Extremity Assessment: Generalized weakness    Cervical / Trunk Assessment Cervical / Trunk Assessment: Kyphotic  Communication   Communication: No difficulties  Cognition Arousal/Alertness: Awake/alert Behavior During Therapy: WFL for tasks assessed/performed Overall Cognitive Status: No family/caregiver present to determine baseline cognitive functioning Area of Impairment: Memory;Safety/judgement;Awareness;Problem solving                   Current Attention Level: Sustained Memory: Decreased short-term memory   Safety/Judgement: Decreased awareness of deficits;Decreased awareness of safety Awareness: Emergent Problem Solving: Requires verbal cues;Slow processing General  Comments: Pt has impaired short term memory as seen by his inability to recall 3 words. He presented with decreased awareness of current deficits and safety. He attributes his impaired cognition to his age.        General Comments General comments (skin integrity, edema, etc.): Patient's skin was intact with no edema    Exercises     Assessment/Plan    PT Assessment Patient needs continued PT services  PT Problem List Decreased strength;Decreased balance;Decreased range of motion;Decreased mobility;Decreased activity tolerance;Decreased cognition;Decreased safety awareness       PT Treatment Interventions DME instruction;Functional mobility training;Balance training;Gait training;Therapeutic activities;Neuromuscular re-education;Therapeutic exercise;Stair training;Cognitive remediation    PT Goals (Current goals can be found in the Care Plan section)  Acute Rehab PT Goals Patient Stated Goal: none stated PT Goal Formulation: With patient Time For Goal Achievement: 06/24/21 Potential to Achieve Goals: Fair    Frequency Min 2X/week     Co-evaluation PT/OT/SLP Co-Evaluation/Treatment: Yes Reason for Co-Treatment: Complexity of the patient's impairments (multi-system involvement);Necessary to address cognition/behavior during functional activity;For patient/therapist safety;To address functional/ADL transfers PT goals addressed during session: Mobility/safety with mobility;Balance;Strengthening/ROM         AM-PAC PT "6 Clicks" Mobility  Outcome Measure Help needed turning from your back to your side while in a flat bed without using bedrails?: A Little Help needed moving from lying on your back to sitting on the side of a flat bed without using bedrails?: A Lot Help needed moving to and from a bed to a chair (including a wheelchair)?: A Lot Help needed standing up from a chair using your arms (e.g., wheelchair or bedside chair)?: A Lot Help needed to walk in hospital room?: A  Lot Help needed climbing 3-5 steps with a railing? : Total 6 Click  Score: 12    End of Session Equipment Utilized During Treatment: Gait belt Activity Tolerance: Patient tolerated treatment well;Patient limited by fatigue;Patient limited by pain Patient left: in bed (on stretcher in hallway in ED) Nurse Communication: Mobility status PT Visit Diagnosis: Unsteadiness on feet (R26.81);Other abnormalities of gait and mobility (R26.89);Repeated falls (R29.6);Muscle weakness (generalized) (M62.81)    Time: 0479-9872 PT Time Calculation (min) (ACUTE ONLY): 21 min   Charges:   PT Evaluation $PT Eval Moderate Complexity: 1 Mod          Quenton Fetter, SPT   Quenton Fetter 06/10/2021, 1:13 PM

## 2021-06-10 NOTE — Evaluation (Signed)
Occupational Therapy Evaluation Patient Details Name: John Shields MRN: [id number] DOB: 02-14-1946 Today's Date: 06/10/2021   History of Present Illness Pt is a 76 y/o male admitted 06/09/21 with rhabdomyolysis after complaints of generalized weakness. Pt reports falling out of bed and staying on the floor for 12 hours prior to reaching a phone to call EMS. Imaging negative. PMH includes: DM, sepsis, HTN, MI, LBBB, prostate cancer, CVA, urine retention.   Clinical Impression   PTA patient independent.  Admitted for above and limited by problem list below, including impaired balance, generalized weakness, lower back pain, and impaired cognition.  Pt oriented and following simple commands with increased time, demonstrates decreased safety awareness, problem solving and recall throughout session.  Unclear if memory deficits are baseline, but pt tends to mask cognitive deficits with humor, he is repetitive with questions, and requires redirection during session.  He requires min assist +2 for transfers and mobility, up to total assist for LB ADLs.  He reports living alone, and may have someone who can stay with him but he is unclear on the details. Believe he will benefit from further OT services while admitted and after dc at SNF level to optimize independence, safety and return to PLOF.  Will follow acutely.      Recommendations for follow up therapy are one component of a multi-disciplinary discharge planning process, led by the attending physician.  Recommendations may be updated based on patient status, additional functional criteria and insurance authorization.   Follow Up Recommendations  Skilled nursing-short term rehab (<3 hours/day)    Assistance Recommended at Discharge Frequent or constant Supervision/Assistance  Patient can return home with the following A little help with walking and/or transfers;A lot of help with bathing/dressing/bathroom;Direct supervision/assist for medications  management;Direct supervision/assist for financial management;Assistance with cooking/housework    Functional Status Assessment  Patient has had a recent decline in their functional status and demonstrates the ability to make significant improvements in function in a reasonable and predictable amount of time.  Equipment Recommendations  BSC/3in1;Other (comment) (RW)    Recommendations for Other Services       Precautions / Restrictions Precautions Precautions: Fall Restrictions Weight Bearing Restrictions: No      Mobility Bed Mobility Overal bed mobility: Needs Assistance Bed Mobility: Supine to Sit;Sit to Supine     Supine to sit: +2 for physical assistance;+2 for safety/equipment;Min assist Sit to supine: Min assist;+2 for physical assistance;+2 for safety/equipment   General bed mobility comments: assist to initate LE and elevate trunk from stretcher    Transfers Overall transfer level: Needs assistance Equipment used: 2 person hand held assist Transfers: Sit to/from Stand Sit to Stand: Min assist;+2 physical assistance;+2 safety/equipment                  Balance Overall balance assessment: Needs assistance Sitting-balance support: No upper extremity supported;Feet supported Sitting balance-Leahy Scale: Fair Sitting balance - Comments: statically, limited dynamically   Standing balance support: During functional activity;Bilateral upper extremity supported Standing balance-Leahy Scale: Poor Standing balance comment: relies on UE support                           ADL either performed or assessed with clinical judgement   ADL Overall ADL's : Needs assistance/impaired     Grooming: Set up;Sitting   Upper Body Bathing: Set up   Lower Body Bathing: Maximal assistance;Sit to/from stand;+2 for safety/equipment   Upper Body Dressing : Set up;Sitting  Lower Body Dressing: Total assistance;+2 for physical assistance;+2 for safety/equipment;Sit  to/from stand Lower Body Dressing Details (indicate cue type and reason): requires assist for socks, min assist +2 in standing Toilet Transfer: Minimal assistance;+2 for physical assistance;+2 for safety/equipment;Ambulation Toilet Transfer Details (indicate cue type and reason): simulated         Functional mobility during ADLs: Minimal assistance;+2 for physical assistance;+2 for safety/equipment General ADL Comments: bil hand held assist, requires cueing for redirection and safety     Vision   Vision Assessment?: No apparent visual deficits     Perception     Praxis      Pertinent Vitals/Pain Pain Assessment: Faces Faces Pain Scale: Hurts a little bit Pain Location: lower back Pain Descriptors / Indicators: Grimacing;Discomfort Pain Intervention(s): Limited activity within patient's tolerance;Monitored during session;Repositioned     Hand Dominance Left   Extremity/Trunk Assessment Upper Extremity Assessment Upper Extremity Assessment: Generalized weakness   Lower Extremity Assessment Lower Extremity Assessment: Defer to PT evaluation       Communication Communication Communication: No difficulties   Cognition Arousal/Alertness: Awake/alert Behavior During Therapy: WFL for tasks assessed/performed Overall Cognitive Status: No family/caregiver present to determine baseline cognitive functioning Area of Impairment: Memory;Safety/judgement;Awareness;Problem solving;Attention                   Current Attention Level: Sustained Memory: Decreased short-term memory   Safety/Judgement: Decreased awareness of deficits;Decreased awareness of safety Awareness: Emergent Problem Solving: Requires verbal cues;Slow processing General Comments: pt asking time of day mulitple times during session, attempted 3 word recall with no retention immediately.  Pt reports "old age" memory deficits, but appears to mask deficits with humor.  poor awareness to safety and  deficits.     General Comments  VSS on RA    Exercises     Shoulder Instructions      Home Living Family/patient expects to be discharged to:: Private residence Living Arrangements: Alone Available Help at Discharge: Friend(s);Available PRN/intermittently Type of Home: Apartment Home Access: Level entry     Home Layout: One level     Bathroom Shower/Tub: Teacher, early years/pre: Standard     Home Equipment: None          Prior Functioning/Environment Prior Level of Function : Independent/Modified Independent             Mobility Comments: reports furniture walking at home ADLs Comments: ind ADLs, IADLs        OT Problem List: Decreased strength;Decreased activity tolerance;Impaired balance (sitting and/or standing);Pain;Decreased knowledge of precautions;Decreased knowledge of use of DME or AE;Decreased safety awareness;Decreased cognition      OT Treatment/Interventions: Self-care/ADL training;Therapeutic exercise;DME and/or AE instruction;Cognitive remediation/compensation;Therapeutic activities;Patient/family education;Balance training    OT Goals(Current goals can be found in the care plan section) Acute Rehab OT Goals Patient Stated Goal: get better OT Goal Formulation: With patient Time For Goal Achievement: 06/24/21 Potential to Achieve Goals: Good  OT Frequency: Min 2X/week    Co-evaluation              AM-PAC OT "6 Clicks" Daily Activity     Outcome Measure Help from another person eating meals?: A Little Help from another person taking care of personal grooming?: A Little Help from another person toileting, which includes using toliet, bedpan, or urinal?: A Lot Help from another person bathing (including washing, rinsing, drying)?: A Lot Help from another person to put on and taking off regular upper body clothing?: A Little Help from another person  to put on and taking off regular lower body clothing?: A Lot 6 Click Score:  15   End of Session Equipment Utilized During Treatment: Gait belt Nurse Communication: Mobility status  Activity Tolerance: Patient tolerated treatment well Patient left: in bed  OT Visit Diagnosis: Other abnormalities of gait and mobility (R26.89);Muscle weakness (generalized) (M62.81);Pain;Other symptoms and signs involving cognitive function Pain - part of body:  (back)                Time: 9417-4081 OT Time Calculation (min): 20 min Charges:  OT General Charges $OT Visit: 1 Visit OT Evaluation $OT Eval Moderate Complexity: 1 Mod  Jolaine Artist, OT Acute Rehabilitation Services Pager [phone] Office [phone]   Delight Stare 06/10/2021, 11:32 AM

## 2021-06-10 NOTE — H&P (Signed)
History and Physical    PLEASE NOTE THAT DRAGON DICTATION SOFTWARE WAS USED IN THE CONSTRUCTION OF THIS NOTE.   John Shields TMA:[id number] DOB: Apr 15, 1946 DOA: 06/09/2021  PCP: [facility]  Patient coming from: home   I have personally briefly reviewed patient's old medical records in [place]  Chief Complaint: Generalized weakness  HPI: John Shields is a 76 y.o. male with medical history significant for type 2 diabetes mellitus, essential hypertension, unprovoked acute pulmonary embolism in 2021, subsequently on chronic anticoagulation via Eliquis, BPH, who is admitted to [facility] on 06/09/2021 with rhabdomyolysis after presenting from home to [facility] ED complaining of generalized weakness.   The patient reports that he fell out of his bed yesterday, onto the floor, without striking his head in the absence of any associated loss of consciousness.  However, he notes that he was too weak and a general sense to be able to extricate himself from the floor, resulting in him remaining on the floor for approximately 12 hours before he was able to crawl to his phone in order to contact EMS who subsequently brought the patient to [facility] emergency department for further evaluation and management of his reported generalized weakness.  The patient reports a history of chronic back pain, without any departure from his baseline in terms of intensity, quality, location of his back pain.  He also denies any acute arthralgias or myalgias.  He specifies that his generalized weakness is not associate any acute focal weakness.   Denies any recent subjective fever, chills, rigors, or generalized myalgias.  No recent chest pain, shortness of breath, palpitations, diaphoresis, nausea, vomiting, dizziness, presyncope, or syncope.  Denies any abdominal pain, diarrhea, melena, hematochezia.  He also denies any recent dysuria, gross hematuria, or change in urinary urgency/frequency.  No  rash, headache, or neck stiffness.  Denies any acute focal weakness, acute focal numbness, paresthesias, facial droop, slurred speech, expressive aphasia, acute change in vision, dysphagia, vertigo.     ED Course:  Vital signs in the ED were notable for the following: Afebrile; heart rate 88-92; blood pressure 126/75 -154/81; respiratory rate 15-19, oxygen saturation 96 to 99% on room air.  Labs were notable for the following: CMP notable for the following: Sodium 138, bicarbonate 25, BUN 27, creatinine 1.18 compared to most recent prior value 1.28 April 2019, BUN/creatinine ratio 22.9, glucose 118.  CPK level 1500.  CBC notable for white blood cell count 9900.  Urinalysis showed no white blood cells, leukocyte Estrace/nitrate negative, specific gravity greater than 1.30, and demonstrated small hemoglobin without any disease.  COVID-19/[place] PCR checked in the ED today found to be negative.  Imaging and additional notable ED work-up: Chest x-ray showed no evidence of acute cardiopulmonary process, including no evidence of infiltrate, edema, effusion, or pneumothorax.  While in the ED, the following were administered: Normal saline x1 L bolus.  Socially, the patient was admitted for overnight observation to med telemetry for further evaluation management of suspected rhabdomyolysis, complicated by generalized weakness and dehydration.     Review of Systems: As per HPI otherwise 10 point review of systems negative.   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 lymphadenopathy    Renal cancer, right ([place])    Rhabdomyolysis 06/10/2021   Stroke ([place])    Thrombocytopenia ([place])    Urine retention    intermittant since 2016    Past Surgical History:  Procedure Laterality Date   NO PAST SURGERIES     ORCHIECTOMY Bilateral 01/31/2017   Procedure: ORCHIECTOMY;  Surgeon: Alexis Frock, MD;  Location: [facility];  Service: Urology;  Laterality: Bilateral;   TONSILLECTOMY      Social History:  reports that he has never smoked. He has never used smokeless tobacco. He reports that he does not drink alcohol and does not use drugs.   No Known Allergies  Family History  Problem Relation Age of Onset   Heart attack Father    Heart attack Brother     Family history reviewed and not pertinent    Prior to Admission medications   Medication Sig Start Date End Date Taking? Authorizing Provider  apixaban (ELIQUIS) 5 MG TABS tablet Take 1 tablet (5 mg total) by mouth 2 (two) times daily. 04/07/19   Nita Sells, MD  atorvastatin (LIPITOR) 80 MG tablet Take 1 tablet (80 mg total) by mouth daily at 6 PM. Patient taking differently: Take 80 mg by mouth at bedtime.  09/03/17   Jani Gravel, MD  b complex vitamins tablet Take 1 tablet by mouth daily.    [provider]  metFORMIN (GLUCOPHAGE) 500 MG tablet Take 1 tablet (500 mg total) by mouth daily with breakfast. Patient taking differently: Take 500 mg by mouth 2 (two) times daily with a meal.  03/22/19 03/21/20  Eulogio Bear U, DO  metoprolol tartrate (LOPRESSOR) 25 MG tablet Take 25 mg by mouth daily.    [provider]  oxyCODONE-acetaminophen (PERCOCET/ROXICET) 5-325 MG tablet Take 1 tablet by mouth every 4 (four) hours as needed for moderate pain. 04/07/19   Nita Sells, MD  tamsulosin (FLOMAX) 0.4 MG CAPS capsule Take 0.4 mg by mouth daily.     [provider]     Objective    Physical Exam: Vitals:   06/09/21 1310 06/09/21 1426 06/09/21 1936 06/10/21 0210   BP: 126/75 (!) 133/58 135/78 (!) 154/81  Pulse: 91 90 88 92  Resp: 15 16 18 19   Temp: (!) 97.4 F (36.3 C) 97.7 F (36.5 C) 98.4 F (36.9 C)   TempSrc: Oral     SpO2: 97% 99% 96% 96%    General: appears to be stated age; alert, oriented Skin: warm, dry, no rash Head:  AT/[place] Mouth:  Oral mucosa membranes appear dry, normal dentition Neck: supple; trachea midline Heart:  RRR; did not appreciate any M/R/G Lungs: CTAB, did not appreciate any wheezes, rales, or rhonchi Abdomen: + BS; soft, ND, NT Vascular: 2+ pedal pulses b/l; 2+ radial pulses b/l Extremities: no peripheral edema, no muscle wasting Neuro: strength and sensation intact in upper and lower extremities b/l    Labs on Admission: I have personally reviewed following labs and imaging studies  CBC: Recent Labs  Lab 06/10/21 0111  WBC 9.9  NEUTROABS 5.7  HGB 13.1  HCT 42.3  MCV 73.4*  PLT 616   Basic Metabolic Panel: Recent Labs  Lab 06/10/21 0111  NA 138  K 4.2  CL 106  CO2 25  GLUCOSE 111*  BUN 27*  CREATININE  1.18  CALCIUM 8.8*   GFR: CrCl cannot be calculated (Unknown ideal weight.). Liver Function Tests: Recent Labs  Lab 06/10/21 0111  AST 67*  ALT 36  ALKPHOS 53  BILITOT 0.9  PROT 7.2  ALBUMIN 4.0   No results for input(s): LIPASE, AMYLASE in the last 168 hours. No results for input(s): AMMONIA in the last 168 hours. Coagulation Profile: No results for input(s): INR, PROTIME in the last 168 hours. Cardiac Enzymes: Recent Labs  Lab 06/10/21 0111  CKTOTAL 1,479*   BNP (last 3 results) No results for input(s): PROBNP in the last 8760 hours. HbA1C: No results for input(s): HGBA1C in the last 72 hours. CBG: No results for input(s): GLUCAP in the last 168 hours. Lipid Profile: No results for input(s): CHOL, HDL, LDLCALC, TRIG, CHOLHDL, LDLDIRECT in the last 72 hours. Thyroid Function Tests: No results for input(s): TSH, T4TOTAL, FREET4, T3FREE, THYROIDAB in the last 72  hours. Anemia Panel: No results for input(s): VITAMINB12, FOLATE, FERRITIN, TIBC, IRON, RETICCTPCT in the last 72 hours. Urine analysis:    Component Value Date/Time   COLORURINE YELLOW 06/10/2021 0205   APPEARANCEUR CLEAR 06/10/2021 0205   LABSPEC >1.030 (H) 06/10/2021 0205   PHURINE 5.5 06/10/2021 0205   GLUCOSEU NEGATIVE 06/10/2021 0205   HGBUR SMALL (A) 06/10/2021 0205   BILIRUBINUR NEGATIVE 06/10/2021 0205   KETONESUR NEGATIVE 06/10/2021 0205   PROTEINUR 30 (A) 06/10/2021 0205   UROBILINOGEN 0.2 11/30/2014 1942   NITRITE NEGATIVE 06/10/2021 0205   LEUKOCYTESUR NEGATIVE 06/10/2021 0205    Radiological Exams on Admission: DG Chest 1 View  Result Date: 06/09/2021 CLINICAL DATA:  Pain EXAM: CHEST  1 VIEW COMPARISON:  04/04/2019 FINDINGS: Heart is borderline in size. Lungs clear. No effusions, edema or pneumothorax. No acute bony abnormality. IMPRESSION: No active disease. Electronically Signed   By: Rolm Baptise M.D.   On: 06/09/2021 23:10   DG Lumbar Spine Complete  Result Date: 06/09/2021 CLINICAL DATA:  Prostate cancer.  New low back pain. EXAM: LUMBAR SPINE - COMPLETE 4+ VIEW COMPARISON:  None. FINDINGS: Normal alignment. No fracture. Diffuse degenerative changes with disc space narrowing and anterior spurring. Diffuse degenerative facet disease. No fracture or malalignment. No focal bone lesion. IMPRESSION: Degenerative changes.  No acute bony abnormality. Electronically Signed   By: Rolm Baptise M.D.   On: 06/09/2021 23:09   DG Knee Complete 4 Views Left  Result Date: 06/09/2021 CLINICAL DATA:  joint pain EXAM: LEFT KNEE - COMPLETE 4+ VIEW COMPARISON:  None. FINDINGS: Normal alignment. No acute fracture. Mild-to-moderate tricompartmental degenerative changes. Bulky patellar enthesophytes. Vascular calcifications. No knee joint effusion. IMPRESSION: No acute osseous abnormality. Mild-to-moderate tricompartmental degenerative changes with bulky patellar enthesophytes.  Electronically Signed   By: Albin Felling M.D.   On: 06/09/2021 14:24   DG Knee Complete 4 Views Right  Result Date: 06/09/2021 CLINICAL DATA:  joint pain EXAM: RIGHT KNEE - COMPLETE 4+ VIEW COMPARISON:  None. FINDINGS: Normal alignment. No acute fracture. Mild-to-moderate tricompartmental degenerative changes. Bulky patellar enthesophytes. No knee joint effusion. Vascular calcifications. IMPRESSION: No acute osseous abnormality. Mild-to-moderate tricompartmental degenerative changes with bulky patellar enthesophytes. Electronically Signed   By: Albin Felling M.D.   On: 06/09/2021 14:26      Assessment/Plan   Principal Problem:   Rhabdomyolysis Active Problems:   Essential hypertension   BPH (benign prostatic hyperplasia)   Diabetes mellitus type II, non insulin dependent (HCC)   Dehydration   Generalized weakness   Acute prerenal azotemia     #)  Rhabdomyolysis: Suspected diagnosis on the basis of elevated CPK of 1500, with urinalysis suggestive of myoglobinuria, as above, felt to be traumatic in nature in the setting of the patient 40 they remained on the floor of his bedroom for greater than 12 hours in the setting of associated generalized weakness preventing him from extricating himself from the floor.  Renal function appears to be at baseline, although he does appear to be dry, with associated acute prerenal azotemia, as further expanded upon below.  We will provide aggressive IV fluids, and closely monitor ensuing renal function as well as trending CPK level, as below.  Of note, the patient is also on high intensity atorvastatin as an outpatient.  Plan: Will saline at 125 cc/h.  Monitor strict I's and O's Daily weights.  Repeat CPK level in the morning.  Repeat BMP in the morning.  Hold home high intensity atorvastatin.        #) Dehydration: Clinical suspicion for such, including the appearance of dry oral mucous membranes as well as laboratory findings notable for acute  prerenal azotemia and UA demonstrating elevated specific gravity, although no evidence of departure from baseline renal function, as further quantified above.   Plan: Monitor strict I's and O's.  Daily weights.  Repeat BMP in the morning. IVF's as above.      #) Generalized weakness: In the absence of any acute focal neurologic deficits to suggest acute ischemic CVA.  Potential contribution from evolving rhabdomyolysis, as above.  No evidence of underlying infectious process, including urinalysis not suggestive of UTI, chest x-ray shows no evidence of pneumonia, and COVID-19/influenza PCR found to be negative.  Suspect contribution from dehydration, as further described above.  Plan: Further evaluation management of suspected presenting rhabdomyolysis as well as dehydration, as further detailed above.  IV fluids, as above .Strict I's and O's and daily weights.  Repeat CMP and CBC in the morning.  Trend CPK, as above.  Physical therapy/Occupational Therapy consults have been placed for the morning.  Check TSH and MMA.         #) Type 2 Diabetes Mellitus: documented history of such. Home insulin regimen: None. Home oral hypoglycemic agents: Metformin. presenting blood sugar: 111.   Plan: accuchecks QAC and HS with low dose SSI. hold home oral hypoglycemic agent during this hospitalization.        #) Essential Hypertension: documented h/o such, with outpatient antihypertensive regimen including Lopressor.  SBP's in the ED today: In the range of the 120s to 150s mmHg.   Plan: Close monitoring of subsequent BP via routine VS. continue home Lopressor       #) Benign Prostatic Hyperplasia:  documented h/o such; on tamsulosin as outpatient.   Plan: monitor strict I's & O's and daily weights. Repeat BMP in AM. continue home tamsulosin.       #) History of unprovoked pulmonary embolism 2021.  Subsequently, the patient has been chronically anticoagulated on Eliquis.  Plan:  Continue home Eliquis.      DVT prophylaxis: SCD's   Code Status: Full code Family Communication: none Disposition Plan: Per Rounding Team Consults called: none;  Admission status: Observation; med telemetry    PLEASE NOTE THAT DRAGON DICTATION SOFTWARE WAS USED IN THE CONSTRUCTION OF THIS NOTE.   [place] DO [facility]  From [place]   06/10/2021, 3:07 AM

## 2021-06-10 NOTE — Progress Notes (Signed)
NEW ADMISSION NOTE New Admission Note:   Arrival Method: stretcher Mental Orientation: A &O X4 Telemetry: 5M06 Assessment: Completed Skin: intact dry scratches on thighs  IV: L wrist Pain: 0/10 Tubes:  none  Safety Measures: Safety Fall Prevention Plan has been given, discussed and signed Admission: Completed [facility] Orientation: Patient has been orientated to the room, unit and staff.  Family: none  Orders have been reviewed and implemented. Will continue to monitor the patient. Call light has been placed within reach and bed alarm has been activated.   Verlene Glantz S Iley Deignan, RN

## 2021-06-10 NOTE — Progress Notes (Signed)
Patient ID: John Shields, male   DOB: [birth year], 76 y.o.   MRN: [id number] Patient admitted early this morning for generalized weakness, rhabdomyolysis, dehydration and has been started on IV fluids.  Patient seen and examined at bedside.  I have reviewed patient's medical records including this morning's H&P, current vitals, labs, and medications myself.  Continue IV fluids.  Repeat a.m. labs.  PT eval.

## 2021-06-10 NOTE — ED Notes (Signed)
PT at bedside.

## 2021-06-11 DIAGNOSIS — N400 Enlarged prostate without lower urinary tract symptoms: Secondary | ICD-10-CM

## 2021-06-11 LAB — COMPREHENSIVE METABOLIC PANEL
ALT: 30 U/L (ref 0–44)
AST: 43 U/L — ABNORMAL HIGH (ref 15–41)
Albumin: 3.1 g/dL — ABNORMAL LOW (ref 3.5–5.0)
Alkaline Phosphatase: 45 U/L (ref 38–126)
Anion gap: 6 (ref 5–15)
BUN: 19 mg/dL (ref 8–23)
CO2: 25 mmol/L (ref 22–32)
Calcium: 8.4 mg/dL — ABNORMAL LOW (ref 8.9–10.3)
Chloride: 107 mmol/L (ref 98–111)
Creatinine, Ser: 0.97 mg/dL (ref 0.61–1.24)
GFR, Estimated: >60 mL/min (ref >60)
Glucose, Bld: 101 mg/dL — ABNORMAL HIGH (ref 70–99)
Potassium: 4.4 mmol/L (ref 3.5–5.1)
Sodium: 138 mmol/L (ref 135–145)
Total Bilirubin: 0.6 mg/dL (ref 0.3–1.2)
Total Protein: 5.8 g/dL — ABNORMAL LOW (ref 6.5–8.1)

## 2021-06-11 LAB — CK: Total CK: 683 U/L — ABNORMAL HIGH (ref 49–397)

## 2021-06-11 LAB — GLUCOSE, CAPILLARY
Glucose-Capillary: 99 mg/dL (ref 70–99)
Glucose-Capillary: 100 mg/dL — ABNORMAL HIGH (ref 70–99)
Glucose-Capillary: 109 mg/dL — ABNORMAL HIGH (ref 70–99)
Glucose-Capillary: 156 mg/dL — ABNORMAL HIGH (ref 70–99)

## 2021-06-11 LAB — MAGNESIUM: Magnesium: 2.1 mg/dL (ref 1.7–2.4)

## 2021-06-11 NOTE — NC FL2 (Signed)
[place] LEVEL OF CARE SCREENING TOOL     IDENTIFICATION  Patient Name: John Shields Birthdate: Nov 19, 1945 Sex: male Admission Date (Current Location): 06/09/2021  [facility] and [place] Number:  Herbalist and Address:  [facility] [place], [place] [address], [place], [place] [address]      Provider Number: [id number]  Attending Physician Name and Address:  Aline August, MD  Relative Name and Phone Number:       Current Level of Care: Hospital Recommended Level of Care: [place] Prior Approval Number:    Date Approved/Denied:   PASRR Number: [id number]  Discharge Plan: SNF    Current Diagnoses: Patient Active Problem List   Diagnosis Date Noted   Rhabdomyolysis 06/10/2021   Dehydration 06/10/2021   Generalized weakness 06/10/2021   Acute prerenal azotemia 06/10/2021   Acute pulmonary embolism ([place]) 04/05/2019   Gross hematuria 04/05/2019   Diabetes mellitus type II, non insulin dependent ([place]) 04/05/2019   Leg hematoma, left, initial encounter 04/05/2019   Hematoma of left lower extremity    Dizziness 03/21/2019   History of CVA (cerebrovascular accident)    History of aneurysm    Dyslipidemia    Acute left-sided weakness 09/01/2017   Acute lower UTI 09/01/2017   Cerebral thrombosis with cerebral infarction 09/01/2017   Near syncope 07/25/2017   Prostate cancer, primary, 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 [facility])    Left bundle branch block (LBBB) on electrocardiogram    H/O bilateral orchiectomies    Dyspnea on exertion    BPH (benign prostatic hyperplasia) 12/27/2016   Renal mass 12/27/2016   Thrombocytopenia ([place]) 12/27/2016   Sepsis ([place]) 12/27/2016   Elevated LFTs    Lactic acidosis    AKI (acute kidney injury) ([place])    Urinary retention 06/07/2016   Constipation 06/07/2016   Acute renal failure (ARF) ([place]) 06/07/2016   Essential hypertension 06/07/2016   Hyperglycemia 06/07/2016     Orientation RESPIRATION BLADDER Height & Weight     Self, Time, Situation, Place  Normal Incontinent, External catheter Weight: 223 lb 1.7 oz (101.2 kg) Height:  6' (182.9 cm)  BEHAVIORAL SYMPTOMS/MOOD NEUROLOGICAL BOWEL NUTRITION STATUS      Continent Diet (please see dc summary)  AMBULATORY STATUS COMMUNICATION OF NEEDS Skin   Limited Assist Verbally Normal                       Personal Care Assistance Level of Assistance  Bathing, Feeding, Dressing Bathing Assistance: Limited assistance Feeding assistance: Independent Dressing Assistance: Limited assistance     Functional Limitations Info             SPECIAL CARE FACTORS FREQUENCY  PT (By licensed PT), OT (By licensed OT)     PT Frequency: 5x/week OT Frequency: 5x/week            Contractures Contractures Info: Not present    Additional Factors Info  Code Status, Allergies, Insulin Sliding Scale Code Status Info: Full Allergies Info: NKA   Insulin Sliding Scale Info: See dc summary       Current Medications (06/11/2021):  This is the current hospital active medication list Current Facility-Administered Medications  Medication Dose Route Frequency Provider Last Rate Last Admin   0.9 %  sodium chloride infusion   Intravenous Continuous Aline August, MD 75 mL/hr at 06/11/21 0858 Rate Change at 06/11/21 0858   acetaminophen (TYLENOL) tablet 650 mg  650 mg Oral Q6H PRN Howerter,  Ethelda Chick, DO       Or   acetaminophen (TYLENOL) suppository 650 mg  650 mg Rectal Q6H PRN Howerter, Justin B, DO       apixaban (ELIQUIS) tablet 5 mg  5 mg Oral BID Howerter, Justin B, DO   5 mg at 06/11/21 0858   insulin aspart (novoLOG) injection 0-6 Units  0-6 Units Subcutaneous TID WC Howerter, Justin B, DO       metoprolol tartrate (LOPRESSOR) tablet 25 mg  25 mg Oral Daily Howerter, Justin B, DO   25 mg at 06/11/21 0858   tamsulosin (FLOMAX) capsule 0.4 mg  0.4 mg Oral Daily Howerter, Justin B, DO   0.4 mg at 06/11/21  8366     Discharge Medications: Please see discharge summary for a list of discharge medications.  Relevant Imaging Results:  Relevant Lab Results:   Additional Information SSN: [id number]. Pfizer vaccines 08/08/19, 09/01/19  Benard Halsted, LCSW

## 2021-06-11 NOTE — TOC Initial Note (Signed)
Transition of Care [facility]) - Initial/Assessment Note    Patient Details  Name: John Shields MRN: [id number] Date of Birth: 13-Jul-1945  [facility]) CM/SW Contact:    Benard Halsted, [place] Phone Number: 06/11/2021, 11:32 AM  Clinical Narrative:                 VA online notification completed: ID# [phone].  CSW received consult for possible SNF placement at time of discharge. CSW spoke with patient. Patient reported that he lives alone and requires additional support at discharge due to his current mobility. Patient expressed understanding of PT recommendation and is agreeable to SNF placement at time of discharge. Patient has not heard of any of the SNFs before. CSW discussed insurance authorization process and provided Medicare SNF ratings list. Patient has received 2 COVID vaccines on file. CSW will send out referrals for review. CSW assisted patient in finding his cell phone.   Skilled Nursing Rehab Facilities-   [email or web address]   Ratings out of 5 possible   Name Address  Phone # [place] Inspection Overall  [facility] [address] [place] 5 5 2 4   [facility]  [address], [place] [phone] 4 2 5 5   [facility] [place], [place] 4 1 1 1   [place], [place] 2 2 4 4   [facility] [address] [place] 2 1 2 1   [place] 3 1 4 3   [facility] [address] [place] 5 2 2 3   [facility] [address] [place] 4 1 2 1   [address] ([place]) [place] 5 1 2 2   [facility] [address], [place] [phone] 4 1 1 1   [facility] [address], [facility] [phone] 4 1 2 1   [facility] ([place])  [place] [place], [place] [phone] 4 1 1 1           [place], [place] [address] [place] 4 2 3 3   [facility] [place] [address] [place] 3 1 5 4   [address] [place], [place] [phone] 2 1 1 1   [facility] [address] [employer] [phone] 2 2 3 3           [address] (no [facility]) [address], [place] [phone] 4 5 5 5   [facility] (No Humana) [address], [place] 4 1 4 3   [facility] (No UHC) [place], [place] 5 5 5 5   [facility] [address], [place] [phone] 3 3 4 4   [place] [address] [place]  [phone] 2 2 2 2   [place]ustin Flock [address] [place] [phone] 3 1 3 2   [place] [address] [place] 1 1 2 1   [place] [address], [place] [phone] 2 1 1 1   [place], [place] 5 2 4 5   [facility] [address] [place] 2 1 1 1   [facility] [address] [place] 3 1 1 1   [facility] [place], [place] 2 1 2 1           [facility] [place]  [address], [place] [phone] 5 3 3 4   [facility] [place] [address] [place] 2 1 1 1   [place] (No Humana) [address], [place] [phone] 2 1 2 1   [facility] [address] [place] [phone] 3 1 1 1           [facility] [place], [place] 4 1 5 4   [facility]) [place], [place] 2 1 3 2   [facility]) [place] [address] [place] 3 1 4 3   [place] [address], [place] 4 1 4 3   [address] [place], [place] 3  3 1 1   [place] [facility]) [address] [place] [phone] 3 2 3 3      Expected Discharge Plan: [place] Barriers to Discharge: Continued Medical Work up, SNF Pending bed offer   Patient Goals and CMS Choice Patient states their goals for this hospitalization and ongoing recovery are:: Rehab and return home CMS Medicare.gov Compare Post Acute Care list provided to:: Patient Choice offered to / list presented to : Patient  Expected Discharge Plan and Services Expected Discharge Plan: [place] In-house Referral: Clinical Social Work   Post Acute Care Choice: [place] Living arrangements for the past 2 months: [place]                                      Prior Living Arrangements/Services Living arrangements for the past 2 months: Single Family Home Lives with:: Self Patient language and need for interpreter reviewed:: Yes Do you feel safe going back to the place where you live?: Yes      Need for Family Participation in Patient Care: Yes (Comment) Care giver support system in place?: No (comment) Current home services: DME Criminal Activity/Legal Involvement Pertinent to Current Situation/Hospitalization: No - Comment as needed  Activities of Daily Living Home Assistive Devices/Equipment: None ADL Screening (condition at time of admission) Patient's cognitive ability adequate to safely complete daily activities?: Yes Is the patient deaf or have difficulty hearing?: No Does the patient have difficulty seeing, even when wearing glasses/contacts?: No Does the patient have difficulty concentrating, remembering, or making decisions?: No Patient able to express need for assistance with ADLs?: Yes Does the patient have difficulty dressing or bathing?: No Independently performs ADLs?: No Communication: Independent Dressing (OT): Needs assistance Is this a change from baseline?: Change  from baseline, expected to last <3days Grooming: Needs assistance Is this a change from baseline?: Change from baseline, expected to last <3 days Bathing: Independent Toileting: Needs assistance Is this a change from baseline?: Change from baseline, expected to last <3 days In/Out Bed: Needs assistance Is this a change from baseline?: Change from baseline, expected to last <3 days Walks in Home: Needs assistance Is this a change from baseline?: Change from baseline, expected to last <3 days Does the patient have difficulty walking or climbing stairs?: Yes Weakness of Legs: Both Weakness of Arms/Hands: None  Permission Sought/Granted Permission sought to share information with : Facility Art therapist granted to share information with : Yes, Verbal Permission Granted     Permission granted to share info w AGENCY: SNFs        Emotional Assessment Appearance:: Appears stated age Attitude/Demeanor/Rapport: Engaged Affect (  typically observed): Accepting, Appropriate, Pleasant Orientation: : Oriented to Self, Oriented to Place, Oriented to  Time, Oriented to Situation Alcohol / Substance Use: Not Applicable Psych Involvement: No (comment)  Admission diagnosis:  Rhabdomyolysis [M62.82] Dehydration [E86.0] Pain [R52] AKI (acute kidney injury) ([place]) [N17.9] Non-traumatic rhabdomyolysis [M62.82] Patient Active Problem List   Diagnosis Date Noted   Rhabdomyolysis 06/10/2021   Dehydration 06/10/2021   Generalized weakness 06/10/2021   Acute prerenal azotemia 06/10/2021   Acute pulmonary embolism ([place]) 04/05/2019   Gross hematuria 04/05/2019   Diabetes mellitus type II, non insulin dependent ([place]) 04/05/2019   Leg hematoma, left, initial encounter 04/05/2019   Hematoma of left lower extremity    Dizziness 03/21/2019   History of CVA (cerebrovascular accident)    History of aneurysm    Dyslipidemia    Acute left-sided weakness 09/01/2017   Acute lower UTI  09/01/2017   Cerebral thrombosis with cerebral infarction 09/01/2017   Near syncope 07/25/2017   Prostate cancer, primary, 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 [facility])    Left bundle branch block (LBBB) on electrocardiogram    H/O bilateral orchiectomies    Dyspnea on exertion    BPH (benign prostatic hyperplasia) 12/27/2016   Renal mass 12/27/2016   Thrombocytopenia ([place]) 12/27/2016   Sepsis ([place]) 12/27/2016   Elevated LFTs    Lactic acidosis    AKI (acute kidney injury) ([place])    Urinary retention 06/07/2016   Constipation 06/07/2016   Acute renal failure (ARF) ([place]) 06/07/2016   Essential hypertension 06/07/2016   Hyperglycemia 06/07/2016   PCP:  Clinic, [place]:   [place], [place] [address] [place] [address] Phone: [phone] Fax: [phone]  [facility] [address] [place] [address] Phone: [phone] Fax: [phone]     Social Determinants of Health (SDOH) Interventions    Readmission Risk Interventions No flowsheet data found.

## 2021-06-11 NOTE — Discharge Instructions (Signed)

## 2021-06-11 NOTE — Progress Notes (Signed)
Initial Nutrition Assessment  DOCUMENTATION CODES:   Not applicable  INTERVENTION:   - Ensure Enlive po BID, each supplement provides 350 kcal and 20 grams of protein  - MVI with minerals daily  NUTRITION DIAGNOSIS:   Increased nutrient needs related to acute illness as evidenced by estimated needs.  GOAL:   Patient will meet greater than or equal to 90% of their needs  MONITOR:   PO intake, Supplement acceptance, Labs, Weight trends  REASON FOR ASSESSMENT:   Malnutrition Screening Tool    ASSESSMENT:   76 year old male who presented to the ED on 1/12 with generalized weakness. PMH of T2DM, HTN, pulmonary embolism in 2021, BPH. Pt admitted with rhabdomyolysis, dehydration.  RD working remotely. Unable to reach pt via phone call to room so unable to obtain diet and weight history at this time.  Reviewed available weight history in chart. Noted pt with weight gain from 2018 to 2020. Last available weight PTA is from 04/04/19 and is 104.3 kg. Current weight of 101.2 kg is consistent with previous weights. It is possible that weight loss may have occurred more acutely but unable to confirm at this time.  RD to order oral nutrition supplements to aid pt in meeting kcal and protein needs. Will also order daily MVI with minerals.  Meal Completion: 70-100%  Medications reviewed and include: SSI IVF: NS @ 75 ml/hr  Labs reviewed: CK 683 (trending down) CBG's: 99-110 x 24 hours  UOP: 1050 ml x 24 hours I/O's: +3.3 L since admit  NUTRITION - FOCUSED PHYSICAL EXAM:  Unable to complete at this time. RD working remotely.  Diet Order:   Diet Order             Diet regular Room service appropriate? Yes; Fluid consistency: Thin  Diet effective now                   EDUCATION NEEDS:   No education needs have been identified at this time  Skin:  Skin Assessment: Reviewed RN Assessment  Last BM:  06/10/21 large type 6  Height:   Ht Readings from Last 1  Encounters:  06/10/21 6' (1.829 m)    Weight:   Wt Readings from Last 1 Encounters:  06/10/21 101.2 kg    BMI:  Body mass index is 30.26 kg/m.  Estimated Nutritional Needs:   Kcal:  2000-2200  Protein:  100-115 grams  Fluid:  >/= 2.0 L    Gustavus Bryant, MS, RD, LDN Inpatient Clinical Dietitian Please see AMiON for contact information.

## 2021-06-11 NOTE — Progress Notes (Signed)
Patient ID: John Shields, male   DOB: 1945-12-21, 76 y.o.   MRN: [id number]  PROGRESS NOTE    Ramie Erman  [id number] DOB: 12-06-1945 DOA: 06/09/2021 PCP: [facility]   Brief Narrative:  76 y.o. male with medical history significant for type 2 diabetes mellitus, essential hypertension, unprovoked acute pulmonary embolism in 2021 currently on Eliquis, BPH presented with generalized weakness and fall at home.  On presentation, CPK was 1479.  He was started on IV fluids.  PT recommended SNF placement.  Assessment & Plan:   Mild rhabdomyolysis Generalized weakness Fall at home Dehydration -Patient presented with worsening generalized weakness, fall at home and was found to have CPK of 4079.  Currently on IV fluids.  CK level improving.  683 this morning.  Decrease normal saline to 75 cc an hour. -PT recommended SNF placement.  Social worker following.  Mild transaminitis -Possibly from above.  Improving.  Diabetes mellitus type 2 -Metformin on hold.  Continue CBGs with SSI  Hypertension -Blood pressure stable.  Continue metoprolol  History of unprovoked PE in 2021 -Continue Eliquis  BPH -Continue Flomax  DVT prophylaxis: Eliquis Code Status: Full Family Communication: None at bedside Disposition Plan: Status is: Inpatient  Remains inpatient appropriate because: Of need for SNF placement and IV fluids.  Consultants: None  Procedures: None  Antimicrobials: None   Subjective: Patient seen and examined at bedside.  Feels slightly better.  No overnight fever, vomiting, shortness of breath reported.  Still feels weak.  Objective: Vitals:   06/10/21 1717 06/10/21 1718 06/10/21 2205 06/11/21 0524  BP: 139/86  (!) 112/58 136/68  Pulse: 76  78 77  Resp: 18  19 17   Temp: 98.1 F (36.7 C)  99.1 F (37.3 C) 97.8 F (36.6 C)  TempSrc:   Oral Oral  SpO2: 100% 100% 100% 99%  Weight:  101.2 kg    Height:  6' (1.829 m)      Intake/Output Summary (Last  24 hours) at 06/11/2021 0813 Last data filed at 06/11/2021 0625 Gross per 24 hour  Intake 3197.34 ml  Output 1050 ml  Net 2147.34 ml   Filed Weights   06/10/21 1718  Weight: 101.2 kg    Examination:  General exam: Appears calm and comfortable.  Currently on room air. Respiratory system: Bilateral decreased breath sounds at bases Cardiovascular system: S1 & S2 heard, Rate controlled Gastrointestinal system: Abdomen is nondistended, soft and nontender. Normal bowel sounds heard. Extremities: No cyanosis, clubbing; trace lower extremity edema Central nervous system: Alert and oriented. No focal neurological deficits. Moving extremities Skin: No rashes, lesions or ulcers Psychiatry: Judgement and insight appear normal. Mood & affect appropriate.     Data Reviewed: I have personally reviewed following labs and imaging studies  CBC: Recent Labs  Lab 06/10/21 0111 06/10/21 0422  WBC 9.9 9.9  NEUTROABS 5.7  --   HGB 13.1 12.9*  HCT 42.3 42.3  MCV 73.4* 74.9*  PLT 214 673   Basic Metabolic Panel: Recent Labs  Lab 06/10/21 0111 06/10/21 0422 06/11/21 0231  NA 138 138 138  K 4.2 4.8 4.4  CL 106 110 107  CO2 25 21* 25  GLUCOSE 111* 100* 101*  BUN 27* 26* 19  CREATININE 1.18 1.09 0.97  CALCIUM 8.8* 8.3* 8.4*  MG  --  2.3 2.1  PHOS  --  3.5  --    GFR: Estimated Creatinine Clearance: 81 mL/min (by C-G formula based on SCr of 0.97 mg/dL). Liver Function Tests: Recent Labs  Lab 06/10/21 0111 06/10/21 0422 06/11/21 0231  AST 67* 61* 43*  ALT 36 30 30  ALKPHOS 53 48 45  BILITOT 0.9 0.7 0.6  PROT 7.2 6.6 5.8*  ALBUMIN 4.0 3.5 3.1*   No results for input(s): LIPASE, AMYLASE in the last 168 hours. No results for input(s): AMMONIA in the last 168 hours. Coagulation Profile: No results for input(s): INR, PROTIME in the last 168 hours. Cardiac Enzymes: Recent Labs  Lab 06/10/21 0111 06/10/21 0422  CKTOTAL 1,479* 1,140*   BNP (last 3 results) No results for  input(s): PROBNP in the last 8760 hours. HbA1C: No results for input(s): HGBA1C in the last 72 hours. CBG: Recent Labs  Lab 06/10/21 0743 06/10/21 1150 06/10/21 1720 06/10/21 2205 06/11/21 0657  GLUCAP 92 119* 99 110* 99   Lipid Profile: No results for input(s): CHOL, HDL, LDLCALC, TRIG, CHOLHDL, LDLDIRECT in the last 72 hours. Thyroid Function Tests: Recent Labs    06/10/21 1739  TSH 0.671   Anemia Panel: No results for input(s): VITAMINB12, FOLATE, FERRITIN, TIBC, IRON, RETICCTPCT in the last 72 hours. Sepsis Labs: No results for input(s): PROCALCITON, LATICACIDVEN in the last 168 hours.  Recent Results (from the past 240 hour(s))  Resp Panel by RT-PCR (Flu A&B, Covid) Nasopharyngeal Swab     Status: None   Collection Time: 06/09/21 10:09 PM   Specimen: Nasopharyngeal Swab; Nasopharyngeal(NP) swabs in vial transport medium  Result Value Ref Range Status   SARS Coronavirus 2 by RT PCR NEGATIVE NEGATIVE Final    Comment: (NOTE) SARS-CoV-2 target nucleic acids are NOT DETECTED.  The SARS-CoV-2 RNA is generally detectable in upper respiratory specimens during the acute phase of infection. The lowest concentration of SARS-CoV-2 viral copies this assay can detect is 138 copies/mL. A negative result does not preclude SARS-Cov-2 infection and should not be used as the sole basis for treatment or other patient management decisions. A negative result may occur with  improper specimen collection/handling, submission of specimen other than nasopharyngeal swab, presence of viral mutation(s) within the areas targeted by this assay, and inadequate number of viral copies(<138 copies/mL). A negative result must be combined with clinical observations, patient history, and epidemiological information. The expected result is Negative.  Fact Sheet for Patients:  EntrepreneurPulse.com.au  Fact Sheet for Healthcare Providers:   IncredibleEmployment.be  This test is no t yet approved or cleared by the [place] FDA and  has been authorized for detection and/or diagnosis of SARS-CoV-2 by FDA under an Emergency Use Authorization (EUA). This EUA will remain  in effect (meaning this test can be used) for the duration of the COVID-19 declaration under Section 564(b)(1) of the Act, 21 U.S.C.section 360bbb-3(b)(1), unless the authorization is terminated  or revoked sooner.       Influenza A by PCR NEGATIVE NEGATIVE Final   Influenza B by PCR NEGATIVE NEGATIVE Final    Comment: (NOTE) The Xpert Xpress SARS-CoV-2/FLU/RSV plus assay is intended as an aid in the diagnosis of influenza from Nasopharyngeal swab specimens and should not be used as a sole basis for treatment. Nasal washings and aspirates are unacceptable for Xpert Xpress SARS-CoV-2/FLU/RSV testing.  Fact Sheet for Patients: EntrepreneurPulse.com.au  Fact Sheet for Healthcare Providers: IncredibleEmployment.be  This test is not yet approved or cleared by the [place] FDA and has been authorized for detection and/or diagnosis of SARS-CoV-2 by FDA under an Emergency Use Authorization (EUA). This EUA will remain in effect (meaning this test can be used) for the duration of the  COVID-19 declaration under Section 564(b)(1) of the Act, 21 U.S.C. section 360bbb-3(b)(1), unless the authorization is terminated or revoked.  Performed at [facility], [place] [address], [place], [place] [address]          Radiology Studies: DG Chest 1 View  Result Date: 06/09/2021 CLINICAL DATA:  Pain EXAM: CHEST  1 VIEW COMPARISON:  04/04/2019 FINDINGS: Heart is borderline in size. Lungs clear. No effusions, edema or pneumothorax. No acute bony abnormality. IMPRESSION: No active disease. Electronically Signed   By: Rolm Baptise M.D.   On: 06/09/2021 23:10   DG Lumbar Spine Complete  Result Date:  06/09/2021 CLINICAL DATA:  Prostate cancer.  New low back pain. EXAM: LUMBAR SPINE - COMPLETE 4+ VIEW COMPARISON:  None. FINDINGS: Normal alignment. No fracture. Diffuse degenerative changes with disc space narrowing and anterior spurring. Diffuse degenerative facet disease. No fracture or malalignment. No focal bone lesion. IMPRESSION: Degenerative changes.  No acute bony abnormality. Electronically Signed   By: Rolm Baptise M.D.   On: 06/09/2021 23:09   DG Knee Complete 4 Views Left  Result Date: 06/09/2021 CLINICAL DATA:  joint pain EXAM: LEFT KNEE - COMPLETE 4+ VIEW COMPARISON:  None. FINDINGS: Normal alignment. No acute fracture. Mild-to-moderate tricompartmental degenerative changes. Bulky patellar enthesophytes. Vascular calcifications. No knee joint effusion. IMPRESSION: No acute osseous abnormality. Mild-to-moderate tricompartmental degenerative changes with bulky patellar enthesophytes. Electronically Signed   By: Albin Felling M.D.   On: 06/09/2021 14:24   DG Knee Complete 4 Views Right  Result Date: 06/09/2021 CLINICAL DATA:  joint pain EXAM: RIGHT KNEE - COMPLETE 4+ VIEW COMPARISON:  None. FINDINGS: Normal alignment. No acute fracture. Mild-to-moderate tricompartmental degenerative changes. Bulky patellar enthesophytes. No knee joint effusion. Vascular calcifications. IMPRESSION: No acute osseous abnormality. Mild-to-moderate tricompartmental degenerative changes with bulky patellar enthesophytes. Electronically Signed   By: Albin Felling M.D.   On: 06/09/2021 14:26        Scheduled Meds:  apixaban  5 mg Oral BID   insulin aspart  0-6 Units Subcutaneous TID WC   metoprolol tartrate  25 mg Oral Daily   tamsulosin  0.4 mg Oral Daily   Continuous Infusions:  sodium chloride 125 mL/hr at 06/11/21 0035          Aline August, MD [facility] 06/11/2021, 8:13 AM

## 2021-06-12 LAB — COMPREHENSIVE METABOLIC PANEL
ALT: 27 U/L (ref 0–44)
AST: 36 U/L (ref 15–41)
Albumin: 3.2 g/dL — ABNORMAL LOW (ref 3.5–5.0)
Alkaline Phosphatase: 51 U/L (ref 38–126)
Anion gap: 9 (ref 5–15)
BUN: 20 mg/dL (ref 8–23)
CO2: 27 mmol/L (ref 22–32)
Calcium: 8.7 mg/dL — ABNORMAL LOW (ref 8.9–10.3)
Chloride: 105 mmol/L (ref 98–111)
Creatinine, Ser: 0.98 mg/dL (ref 0.61–1.24)
GFR, Estimated: >60 mL/min (ref >60)
Glucose, Bld: 102 mg/dL — ABNORMAL HIGH (ref 70–99)
Potassium: 4.2 mmol/L (ref 3.5–5.1)
Sodium: 141 mmol/L (ref 135–145)
Total Bilirubin: 0.7 mg/dL (ref 0.3–1.2)
Total Protein: 6.2 g/dL — ABNORMAL LOW (ref 6.5–8.1)

## 2021-06-12 LAB — GLUCOSE, CAPILLARY
Glucose-Capillary: 114 mg/dL — ABNORMAL HIGH (ref 70–99)
Glucose-Capillary: 125 mg/dL — ABNORMAL HIGH (ref 70–99)
Glucose-Capillary: 121 mg/dL — ABNORMAL HIGH (ref 70–99)
Glucose-Capillary: 111 mg/dL — ABNORMAL HIGH (ref 70–99)

## 2021-06-12 LAB — MAGNESIUM: Magnesium: 2 mg/dL (ref 1.7–2.4)

## 2021-06-12 LAB — CK: Total CK: 361 U/L (ref 49–397)

## 2021-06-12 NOTE — TOC Progression Note (Signed)
Transition of Care [facility]) - Progression Note    Patient Details  Name: John Shields MRN: [id number] Date of Birth: Oct 07, 1945  Transition of Care [facility] CM/SW [place], [place] Phone Number: 06/12/2021, 2:28 PM  Clinical Narrative:    CSW attempted to follow up with pt and pt spouse about snf options, no answer. CSW left a message letting pt and spouse know CSW will follow up tomorrow on SNF options.   Expected Discharge Plan: [place] Barriers to Discharge: Continued Medical Work up, SNF Pending bed offer  Expected Discharge Plan and Services Expected Discharge Plan: [place] In-house Referral: Clinical Social Work   Post Acute Care Choice: [place] Living arrangements for the past 2 months: Single Family Home                                       Social Determinants of Health (SDOH) Interventions    Readmission Risk Interventions No flowsheet data found.

## 2021-06-12 NOTE — Progress Notes (Signed)
Patient ID: John Shields, male   DOB: 1945-12-28, 76 y.o.   MRN: [id number]  PROGRESS NOTE    John Shields  WUJ:[id number] DOB: 08-13-1945 DOA: 06/09/2021 PCP: [facility]   Brief Narrative:  76 y.o. male with medical history significant for type 2 diabetes mellitus, essential hypertension, unprovoked acute pulmonary embolism in 2021 currently on Eliquis, BPH presented with generalized weakness and fall at home.  On presentation, CPK was 1479.  He was started on IV fluids.  PT recommended SNF placement.  Assessment & Plan:   Mild rhabdomyolysis Generalized weakness Fall at home Dehydration -Patient presented with worsening generalized weakness, fall at home and was found to have CPK of 4079.  Currently on IV fluids.  CK level improving; pending this morning.  Currently on normal saline at 75 cc an hour. -PT recommended SNF placement.  Social worker following.  Mild transaminitis -Possibly from above.  Improving.  Pending this morning.  Diabetes mellitus type 2 -Metformin on hold.  Continue CBGs with SSI.  Hypertension -Blood pressure stable.  Continue metoprolol  History of unprovoked PE in 2021 -Continue Eliquis  BPH -Continue Flomax  DVT prophylaxis: Eliquis Code Status: Full Family Communication: None at bedside Disposition Plan: Status is: Inpatient  Remains inpatient appropriate because: Of need for SNF placement.  Currently medically safe for discharge  Consultants: None  Procedures: None  Antimicrobials: None   Subjective: Patient seen and examined at bedside.  No shortness of breath, nausea, vomiting, fever reported.  Objective: Vitals:   06/11/21 0906 06/11/21 1708 06/11/21 2058 06/12/21 0520  BP: (!) 154/78 (!) 161/78 (!) 128/59 (!) 162/89  Pulse: 81 78 77 74  Resp: 20 18 17 18   Temp: 98.2 F (36.8 C) 98.7 F (37.1 C) 98 F (36.7 C) 98 F (36.7 C)  TempSrc: Oral Oral Oral Oral  SpO2: 97% 97% 96% 97%  Weight:    106 kg  Height:         Intake/Output Summary (Last 24 hours) at 06/12/2021 0752 Last data filed at 06/12/2021 0600 Gross per 24 hour  Intake 3182.25 ml  Output 2900 ml  Net 282.25 ml    Filed Weights   06/10/21 1718 06/12/21 0520  Weight: 101.2 kg 106 kg    Examination:  General exam: On room air currently.  No distress.   Respiratory system: Decreased breath sounds at bases bilaterally with scattered rales  cardiovascular system: Currently rate controlled; S1-S2 heard Gastrointestinal system: Abdomen is mildly distended; soft and nontender.  Bowel sounds are heard Extremities: Mild lower extremity edema; no clubbing   Data Reviewed: I have personally reviewed following labs and imaging studies  CBC: Recent Labs  Lab 06/10/21 0111 06/10/21 0422  WBC 9.9 9.9  NEUTROABS 5.7  --   HGB 13.1 12.9*  HCT 42.3 42.3  MCV 73.4* 74.9*  PLT 214 956    Basic Metabolic Panel: Recent Labs  Lab 06/10/21 0111 06/10/21 0422 06/11/21 0231  NA 138 138 138  K 4.2 4.8 4.4  CL 106 110 107  CO2 25 21* 25  GLUCOSE 111* 100* 101*  BUN 27* 26* 19  CREATININE 1.18 1.09 0.97  CALCIUM 8.8* 8.3* 8.4*  MG  --  2.3 2.1  PHOS  --  3.5  --     GFR: Estimated Creatinine Clearance: 82.8 mL/min (by C-G formula based on SCr of 0.97 mg/dL). Liver Function Tests: Recent Labs  Lab 06/10/21 0111 06/10/21 0422 06/11/21 0231  AST 67* 61* 43*  ALT 36  30 30  ALKPHOS 53 48 45  BILITOT 0.9 0.7 0.6  PROT 7.2 6.6 5.8*  ALBUMIN 4.0 3.5 3.1*    No results for input(s): LIPASE, AMYLASE in the last 168 hours. No results for input(s): AMMONIA in the last 168 hours. Coagulation Profile: No results for input(s): INR, PROTIME in the last 168 hours. Cardiac Enzymes: Recent Labs  Lab 06/10/21 0111 06/10/21 0422 06/11/21 0907  CKTOTAL 1,479* 1,140* 683*    BNP (last 3 results) No results for input(s): PROBNP in the last 8760 hours. HbA1C: No results for input(s): HGBA1C in the last 72 hours. CBG: Recent  Labs  Lab 06/11/21 0657 06/11/21 1129 06/11/21 1707 06/11/21 2136 06/12/21 0749  GLUCAP 99 100* 109* 156* 114*    Lipid Profile: No results for input(s): CHOL, HDL, LDLCALC, TRIG, CHOLHDL, LDLDIRECT in the last 72 hours. Thyroid Function Tests: Recent Labs    06/10/21 1739  TSH 0.671    Anemia Panel: No results for input(s): VITAMINB12, FOLATE, FERRITIN, TIBC, IRON, RETICCTPCT in the last 72 hours. Sepsis Labs: No results for input(s): PROCALCITON, LATICACIDVEN in the last 168 hours.  Recent Results (from the past 240 hour(s))  Resp Panel by RT-PCR (Flu A&B, Covid) Nasopharyngeal Swab     Status: None   Collection Time: 06/09/21 10:09 PM   Specimen: Nasopharyngeal Swab; Nasopharyngeal(NP) swabs in vial transport medium  Result Value Ref Range Status   SARS Coronavirus 2 by RT PCR NEGATIVE NEGATIVE Final    Comment: (NOTE) SARS-CoV-2 target nucleic acids are NOT DETECTED.  The SARS-CoV-2 RNA is generally detectable in upper respiratory specimens during the acute phase of infection. The lowest concentration of SARS-CoV-2 viral copies this assay can detect is 138 copies/mL. A negative result does not preclude SARS-Cov-2 infection and should not be used as the sole basis for treatment or other patient management decisions. A negative result may occur with  improper specimen collection/handling, submission of specimen other than nasopharyngeal swab, presence of viral mutation(s) within the areas targeted by this assay, and inadequate number of viral copies(<138 copies/mL). A negative result must be combined with clinical observations, patient history, and epidemiological information. The expected result is Negative.  Fact Sheet for Patients:  EntrepreneurPulse.com.au  Fact Sheet for Healthcare Providers:  IncredibleEmployment.be  This test is no t yet approved or cleared by the [place] FDA and  has been authorized for detection  and/or diagnosis of SARS-CoV-2 by FDA under an Emergency Use Authorization (EUA). This EUA will remain  in effect (meaning this test can be used) for the duration of the COVID-19 declaration under Section 564(b)(1) of the Act, 21 U.S.C.section 360bbb-3(b)(1), unless the authorization is terminated  or revoked sooner.       Influenza A by PCR NEGATIVE NEGATIVE Final   Influenza B by PCR NEGATIVE NEGATIVE Final    Comment: (NOTE) The Xpert Xpress SARS-CoV-2/FLU/RSV plus assay is intended as an aid in the diagnosis of influenza from Nasopharyngeal swab specimens and should not be used as a sole basis for treatment. Nasal washings and aspirates are unacceptable for Xpert Xpress SARS-CoV-2/FLU/RSV testing.  Fact Sheet for Patients: EntrepreneurPulse.com.au  Fact Sheet for Healthcare Providers: IncredibleEmployment.be  This test is not yet approved or cleared by the [place] FDA and has been authorized for detection and/or diagnosis of SARS-CoV-2 by FDA under an Emergency Use Authorization (EUA). This EUA will remain in effect (meaning this test can be used) for the duration of the COVID-19 declaration under Section 564(b)(1) of the Act,  21 U.S.C. section 360bbb-3(b)(1), unless the authorization is terminated or revoked.  Performed at [facility], [place] [address] [place], [place] [address]           Radiology Studies: No results found.      Scheduled Meds:  apixaban  5 mg Oral BID   insulin aspart  0-6 Units Subcutaneous TID WC   metoprolol tartrate  25 mg Oral Daily   tamsulosin  0.4 mg Oral Daily   Continuous Infusions:  sodium chloride 75 mL/hr at 06/11/21 2325          Aline August, MD [facility] 06/12/2021, 7:52 AM

## 2021-06-13 LAB — RESP PANEL BY RT-PCR (FLU A&B, COVID) ARPGX2
Influenza A by PCR: NEGATIVE
Influenza B by PCR: NEGATIVE
SARS Coronavirus 2 by RT PCR: NEGATIVE

## 2021-06-13 LAB — GLUCOSE, CAPILLARY
Glucose-Capillary: 109 mg/dL — ABNORMAL HIGH (ref 70–99)
Glucose-Capillary: 107 mg/dL — ABNORMAL HIGH (ref 70–99)
Glucose-Capillary: 118 mg/dL — ABNORMAL HIGH (ref 70–99)
Glucose-Capillary: 111 mg/dL — ABNORMAL HIGH (ref 70–99)

## 2021-06-13 NOTE — Progress Notes (Signed)
Gave report to [employer] staff nurse at [facility] and answered all her questions.  Waiting for [facility] to pick the patient.

## 2021-06-13 NOTE — Progress Notes (Signed)
DISCHARGE NOTE SNF   John Shields to be discharged [name] per MD order. Patient verbalized understanding.  Skin clean, dry and intact without evidence of skin break down, no evidence of skin tears noted. IV catheter discontinued intact. Site without signs and symptoms of complications. Dressing and pressure applied. Pt denies pain at the site currently. No complaints noted.  Patient free of lines, drains, and wounds.   Discharge packet assembled. An After Visit Summary (AVS) was printed and given to the EMS personnel. Patient escorted via stretcher and discharged to [name] via ambulance. Report called to accepting facility by dayshift RN Ulice Dash; all questions and concerns addressed.   Babs Sciara, RN

## 2021-06-13 NOTE — TOC Progression Note (Addendum)
Transition of Care [facility]) - Initial/Assessment Note    Patient Details  Name: John Shields MRN: [id number] Date of Birth: December 31, 1945  Transition of Care [facility]) CM/SW Contact:    Milinda Antis, [place] Phone Number: 06/13/2021, 10:19 AM  Clinical Narrative:                 CSW presented bed offers to the patient.  The patient choose [facility].  CSW verified that there is a bed available today.    10:45-  CSW received notification from Star with [facility].  There is an issue with the patient's insurance.    CSW assisted the patient with calling Medicare.  The patient was able to resolve the issue.    CSW contacted Star with [facility] and left a message that the issue was resolved.  CSW is awaiting a returned call with confirmation that the patient can go to the SNF today.     12:00-  CSW received confirmation from Star with admissions at [facility] the the patient can discharge to the facility today.  Pending: COVID test results  Expected Discharge Plan: [place] Barriers to Discharge: Continued Medical Work up, SNF Pending bed offer   Patient Goals and CMS Choice Patient states their goals for this hospitalization and ongoing recovery are:: Rehab and return home CMS Medicare.gov Compare Post Acute Care list provided to:: Patient Choice offered to / list presented to : Patient  Expected Discharge Plan and Services Expected Discharge Plan: [place] In-house Referral: Clinical Social Work   Post Acute Care Choice: [place] Living arrangements for the past 2 months: [place]                                      Prior Living Arrangements/Services Living arrangements for the past 2 months: Single Family Home Lives with:: Self Patient language and need for interpreter reviewed:: Yes Do you feel safe going back to the place where you live?: Yes      Need for Family Participation in Patient Care: Yes  (Comment) Care giver support system in place?: No (comment) Current home services: DME Criminal Activity/Legal Involvement Pertinent to Current Situation/Hospitalization: No - Comment as needed  Activities of Daily Living Home Assistive Devices/Equipment: None ADL Screening (condition at time of admission) Patient's cognitive ability adequate to safely complete daily activities?: Yes Is the patient deaf or have difficulty hearing?: No Does the patient have difficulty seeing, even when wearing glasses/contacts?: No Does the patient have difficulty concentrating, remembering, or making decisions?: No Patient able to express need for assistance with ADLs?: Yes Does the patient have difficulty dressing or bathing?: No Independently performs ADLs?: No Communication: Independent Dressing (OT): Needs assistance Is this a change from baseline?: Change from baseline, expected to last <3days Grooming: Needs assistance Is this a change from baseline?: Change from baseline, expected to last <3 days Bathing: Independent Toileting: Needs assistance Is this a change from baseline?: Change from baseline, expected to last <3 days In/Out Bed: Needs assistance Is this a change from baseline?: Change from baseline, expected to last <3 days Walks in Home: Needs assistance Is this a change from baseline?: Change from baseline, expected to last <3 days Does the patient have difficulty walking or climbing stairs?: Yes Weakness of Legs: Both Weakness of Arms/Hands: None  Permission Sought/Granted Permission sought to share information with : Facility Contact  Representative Permission granted to share information with : Yes, Verbal Permission Granted     Permission granted to share info w AGENCY: SNFs        Emotional Assessment Appearance:: Appears stated age Attitude/Demeanor/Rapport: Engaged Affect (typically observed): Accepting, Appropriate, Pleasant Orientation: : Oriented to Self, Oriented to  Place, Oriented to  Time, Oriented to Situation Alcohol / Substance Use: Not Applicable Psych Involvement: No (comment)  Admission diagnosis:  Rhabdomyolysis [M62.82] Dehydration [E86.0] Pain [R52] AKI (acute kidney injury) ([place]) [N17.9] Non-traumatic rhabdomyolysis [M62.82] Patient Active Problem List   Diagnosis Date Noted   Rhabdomyolysis 06/10/2021   Dehydration 06/10/2021   Generalized weakness 06/10/2021   Acute prerenal azotemia 06/10/2021   Acute pulmonary embolism ([place]) 04/05/2019   Gross hematuria 04/05/2019   Diabetes mellitus type II, non insulin dependent ([place]) 04/05/2019   Leg hematoma, left, initial encounter 04/05/2019   Hematoma of left lower extremity    Dizziness 03/21/2019   History of CVA (cerebrovascular accident)    History of aneurysm    Dyslipidemia    Acute left-sided weakness 09/01/2017   Acute lower UTI 09/01/2017   Cerebral thrombosis with cerebral infarction 09/01/2017   Near syncope 07/25/2017   Prostate cancer, primary, 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 [facility])    Left bundle branch block (LBBB) on electrocardiogram    H/O bilateral orchiectomies    Dyspnea on exertion    BPH (benign prostatic hyperplasia) 12/27/2016   Renal mass 12/27/2016   Thrombocytopenia ([place]) 12/27/2016   Sepsis ([place]) 12/27/2016   Elevated LFTs    Lactic acidosis    AKI (acute kidney injury) ([place])    Urinary retention 06/07/2016   Constipation 06/07/2016   Acute renal failure (ARF) ([place]) 06/07/2016   Essential hypertension 06/07/2016   Hyperglycemia 06/07/2016   PCP:  Clinic, [place]:   [place], [place] - [address] [place] [address] Phone: [phone] Fax: [phone]  [facility] [address] [place] [address] Phone: [phone] Fax: [phone]     Social Determinants of Health (SDOH)  Interventions    Readmission Risk Interventions No flowsheet data found.

## 2021-06-13 NOTE — Discharge Summary (Signed)
Physician Discharge Summary  John Shields [id number] DOB: December 31, 1945 DOA: 06/09/2021  PCP: [facility]  Admit date: 06/09/2021 Discharge date: 06/13/2021  Admitted From: Home Disposition: SNF  Recommendations for Outpatient Follow-up:  Follow up with SNF provider at earliest convenience with repeat CMP in the next few days Follow up in ED if symptoms worsen or new appear   Home Health: No Equipment/Devices: None  Discharge Condition: Stable CODE STATUS: Full Diet recommendation: Heart healthy/carb modified  Brief/Interim Summary: 76 y.o. male with medical history significant for type 2 diabetes mellitus, essential hypertension, unprovoked acute pulmonary embolism in 2021 currently on Eliquis, BPH presented with generalized weakness and fall at home.  On presentation, CPK was 1479.  He was started on IV fluids.  PT recommended SNF placement.  He will be discharged to SNF once bed is available.  Discharge Diagnoses:   Mild rhabdomyolysis Generalized weakness Fall at home Dehydration -Patient presented with worsening generalized weakness, fall at home and was found to have CPK of 4079.  Treated with IV fluids.  CK level normalized on 06/12/2021.  DC'd IV fluids on 06/12/2021. -PT recommended SNF placement.   -He will be discharged to SNF once bed is available.   Mild transaminitis -Possibly from above.  Improved.   Diabetes mellitus type 2 -Metformin on hold.  Continue CBGs with SSI.   Hypertension -Blood pressure stable.  Continue metoprolol  History of unprovoked PE in 2021 -Continue Eliquis  BPH -Continue Flomax  Hyperlipidemia -He was on Lipitor 80 mg daily prior to presentation.  We will keep this on hold till reevaluation as an outpatient.    Discharge Instructions  Discharge Instructions     Diet - low sodium heart healthy   Complete by: As directed    Diet Carb Modified   Complete by: As directed    Increase activity slowly   Complete  by: As directed       Allergies as of 06/13/2021   No Known Allergies      Medication List     STOP taking these medications    atorvastatin 80 MG tablet Commonly known as: LIPITOR   metFORMIN 500 MG tablet Commonly known as: Glucophage   oxyCODONE-acetaminophen 5-325 MG tablet Commonly known as: PERCOCET/ROXICET       TAKE these medications    apixaban 5 MG Tabs tablet Commonly known as: ELIQUIS Take 1 tablet (5 mg total) by mouth 2 (two) times daily.   b complex vitamins tablet Take 1 tablet by mouth daily.   metoprolol tartrate 25 MG tablet Commonly known as: LOPRESSOR Take 25 mg by mouth daily.   tamsulosin 0.4 MG Caps capsule Commonly known as: FLOMAX Take 0.4 mg by mouth daily.        No Known Allergies  Consultations: None   Procedures/Studies: DG Chest 1 View  Result Date: 06/09/2021 CLINICAL DATA:  Pain EXAM: CHEST  1 VIEW COMPARISON:  04/04/2019 FINDINGS: Heart is borderline in size. Lungs clear. No effusions, edema or pneumothorax. No acute bony abnormality. IMPRESSION: No active disease. Electronically Signed   By: Rolm Baptise M.D.   On: 06/09/2021 23:10   DG Lumbar Spine Complete  Result Date: 06/09/2021 CLINICAL DATA:  Prostate cancer.  New low back pain. EXAM: LUMBAR SPINE - COMPLETE 4+ VIEW COMPARISON:  None. FINDINGS: Normal alignment. No fracture. Diffuse degenerative changes with disc space narrowing and anterior spurring. Diffuse degenerative facet disease. No fracture or malalignment. No focal bone lesion. IMPRESSION: Degenerative changes.  No acute bony  abnormality. Electronically Signed   By: Rolm Baptise M.D.   On: 06/09/2021 23:09   DG Knee Complete 4 Views Left  Result Date: 06/09/2021 CLINICAL DATA:  joint pain EXAM: LEFT KNEE - COMPLETE 4+ VIEW COMPARISON:  None. FINDINGS: Normal alignment. No acute fracture. Mild-to-moderate tricompartmental degenerative changes. Bulky patellar enthesophytes. Vascular calcifications. No  knee joint effusion. IMPRESSION: No acute osseous abnormality. Mild-to-moderate tricompartmental degenerative changes with bulky patellar enthesophytes. Electronically Signed   By: Albin Felling M.D.   On: 06/09/2021 14:24   DG Knee Complete 4 Views Right  Result Date: 06/09/2021 CLINICAL DATA:  joint pain EXAM: RIGHT KNEE - COMPLETE 4+ VIEW COMPARISON:  None. FINDINGS: Normal alignment. No acute fracture. Mild-to-moderate tricompartmental degenerative changes. Bulky patellar enthesophytes. No knee joint effusion. Vascular calcifications. IMPRESSION: No acute osseous abnormality. Mild-to-moderate tricompartmental degenerative changes with bulky patellar enthesophytes. Electronically Signed   By: Albin Felling M.D.   On: 06/09/2021 14:26      Subjective: Patient seen and examined at bedside.  Denies fever, vomiting, chest pain.  Still feels weak.  Appetite is good.  Discharge Exam: Vitals:   06/13/21 0448 06/13/21 0933  BP: (!) 155/83 (!) 151/86  Pulse: 73 77  Resp: 18 18  Temp: 98.5 F (36.9 C) 97.9 F (36.6 C)  SpO2: 97% 93%    General exam: No acute distress.  Currently on room air.   Respiratory system: Bilateral decreased breath sounds at bases with some crackles  cardiovascular system: S1-S2 heard; rate controlled  gastrointestinal system: Abdomen is distended slightly; soft and nontender.  Normal bowel sounds heard  extremities: No cyanosis; trace lower extremity edema present    The results of significant diagnostics from this hospitalization (including imaging, microbiology, ancillary and laboratory) are listed below for reference.     Microbiology: Recent Results (from the past 240 hour(s))  Resp Panel by RT-PCR (Flu A&B, Covid) Nasopharyngeal Swab     Status: None   Collection Time: 06/09/21 10:09 PM   Specimen: Nasopharyngeal Swab; Nasopharyngeal(NP) swabs in vial transport medium  Result Value Ref Range Status   SARS Coronavirus 2 by RT PCR NEGATIVE NEGATIVE  Final    Comment: (NOTE) SARS-CoV-2 target nucleic acids are NOT DETECTED.  The SARS-CoV-2 RNA is generally detectable in upper respiratory specimens during the acute phase of infection. The lowest concentration of SARS-CoV-2 viral copies this assay can detect is 138 copies/mL. A negative result does not preclude SARS-Cov-2 infection and should not be used as the sole basis for treatment or other patient management decisions. A negative result may occur with  improper specimen collection/handling, submission of specimen other than nasopharyngeal swab, presence of viral mutation(s) within the areas targeted by this assay, and inadequate number of viral copies(<138 copies/mL). A negative result must be combined with clinical observations, patient history, and epidemiological information. The expected result is Negative.  Fact Sheet for Patients:  EntrepreneurPulse.com.au  Fact Sheet for Healthcare Providers:  IncredibleEmployment.be  This test is no t yet approved or cleared by the [place] FDA and  has been authorized for detection and/or diagnosis of SARS-CoV-2 by FDA under an Emergency Use Authorization (EUA). This EUA will remain  in effect (meaning this test can be used) for the duration of the COVID-19 declaration under Section 564(b)(1) of the Act, 21 U.S.C.section 360bbb-3(b)(1), unless the authorization is terminated  or revoked sooner.       Influenza A by PCR NEGATIVE NEGATIVE Final   Influenza B by PCR NEGATIVE NEGATIVE Final  Comment: (NOTE) The Xpert Xpress SARS-CoV-2/FLU/RSV plus assay is intended as an aid in the diagnosis of influenza from Nasopharyngeal swab specimens and should not be used as a sole basis for treatment. Nasal washings and aspirates are unacceptable for Xpert Xpress SARS-CoV-2/FLU/RSV testing.  Fact Sheet for Patients: EntrepreneurPulse.com.au  Fact Sheet for Healthcare  Providers: IncredibleEmployment.be  This test is not yet approved or cleared by the [place] FDA and has been authorized for detection and/or diagnosis of SARS-CoV-2 by FDA under an Emergency Use Authorization (EUA). This EUA will remain in effect (meaning this test can be used) for the duration of the COVID-19 declaration under Section 564(b)(1) of the Act, 21 U.S.C. section 360bbb-3(b)(1), unless the authorization is terminated or revoked.  Performed at [facility], [place] [address], [place], [place] [address]      Labs: BNP (last 3 results) Recent Labs    06/10/21 0111  BNP 62.7   Basic Metabolic Panel: Recent Labs  Lab 06/10/21 0111 06/10/21 0422 06/11/21 0231 06/12/21 0736  NA 138 138 138 141  K 4.2 4.8 4.4 4.2  CL 106 110 107 105  CO2 25 21* 25 27  GLUCOSE 111* 100* 101* 102*  BUN 27* 26* 19 20  CREATININE 1.18 1.09 0.97 0.98  CALCIUM 8.8* 8.3* 8.4* 8.7*  MG  --  2.3 2.1 2.0  PHOS  --  3.5  --   --    Liver Function Tests: Recent Labs  Lab 06/10/21 0111 06/10/21 0422 06/11/21 0231 06/12/21 0736  AST 67* 61* 43* 36  ALT 36 30 30 27   ALKPHOS 53 48 45 51  BILITOT 0.9 0.7 0.6 0.7  PROT 7.2 6.6 5.8* 6.2*  ALBUMIN 4.0 3.5 3.1* 3.2*   No results for input(s): LIPASE, AMYLASE in the last 168 hours. No results for input(s): AMMONIA in the last 168 hours. CBC: Recent Labs  Lab 06/10/21 0111 06/10/21 0422  WBC 9.9 9.9  NEUTROABS 5.7  --   HGB 13.1 12.9*  HCT 42.3 42.3  MCV 73.4* 74.9*  PLT 214 191   Cardiac Enzymes: Recent Labs  Lab 06/10/21 0111 06/10/21 0422 06/11/21 0907 06/12/21 0736  CKTOTAL 1,479* 1,140* 683* 361   BNP: Invalid input(s): POCBNP CBG: Recent Labs  Lab 06/12/21 0749 06/12/21 1128 06/12/21 1650 06/12/21 2207 06/13/21 0643  GLUCAP 114* 125* 121* 111* 109*   D-Dimer No results for input(s): DDIMER in the last 72 hours. Hgb A1c No results for input(s): HGBA1C in the last 72  hours. Lipid Profile No results for input(s): CHOL, HDL, LDLCALC, TRIG, CHOLHDL, LDLDIRECT in the last 72 hours. Thyroid function studies Recent Labs    06/10/21 1739  TSH 0.671   Anemia work up No results for input(s): VITAMINB12, FOLATE, FERRITIN, TIBC, IRON, RETICCTPCT in the last 72 hours. Urinalysis    Component Value Date/Time   COLORURINE YELLOW 06/10/2021 0205   APPEARANCEUR CLEAR 06/10/2021 0205   LABSPEC >1.030 (H) 06/10/2021 0205   PHURINE 5.5 06/10/2021 0205   GLUCOSEU NEGATIVE 06/10/2021 0205   HGBUR SMALL (A) 06/10/2021 0205   BILIRUBINUR NEGATIVE 06/10/2021 0205   KETONESUR NEGATIVE 06/10/2021 0205   PROTEINUR 30 (A) 06/10/2021 0205   UROBILINOGEN 0.2 11/30/2014 1942   NITRITE NEGATIVE 06/10/2021 0205   LEUKOCYTESUR NEGATIVE 06/10/2021 0205   Sepsis Labs Invalid input(s): PROCALCITONIN,  WBC,  LACTICIDVEN Microbiology Recent Results (from the past 240 hour(s))  Resp Panel by RT-PCR (Flu A&B, Covid) Nasopharyngeal Swab     Status: None  Collection Time: 06/09/21 10:09 PM   Specimen: Nasopharyngeal Swab; Nasopharyngeal(NP) swabs in vial transport medium  Result Value Ref Range Status   SARS Coronavirus 2 by RT PCR NEGATIVE NEGATIVE Final    Comment: (NOTE) SARS-CoV-2 target nucleic acids are NOT DETECTED.  The SARS-CoV-2 RNA is generally detectable in upper respiratory specimens during the acute phase of infection. The lowest concentration of SARS-CoV-2 viral copies this assay can detect is 138 copies/mL. A negative result does not preclude SARS-Cov-2 infection and should not be used as the sole basis for treatment or other patient management decisions. A negative result may occur with  improper specimen collection/handling, submission of specimen other than nasopharyngeal swab, presence of viral mutation(s) within the areas targeted by this assay, and inadequate number of viral copies(<138 copies/mL). A negative result must be combined with clinical  observations, patient history, and epidemiological information. The expected result is Negative.  Fact Sheet for Patients:  EntrepreneurPulse.com.au  Fact Sheet for Healthcare Providers:  IncredibleEmployment.be  This test is no t yet approved or cleared by the [place] FDA and  has been authorized for detection and/or diagnosis of SARS-CoV-2 by FDA under an Emergency Use Authorization (EUA). This EUA will remain  in effect (meaning this test can be used) for the duration of the COVID-19 declaration under Section 564(b)(1) of the Act, 21 U.S.C.section 360bbb-3(b)(1), unless the authorization is terminated  or revoked sooner.       Influenza A by PCR NEGATIVE NEGATIVE Final   Influenza B by PCR NEGATIVE NEGATIVE Final    Comment: (NOTE) The Xpert Xpress SARS-CoV-2/FLU/RSV plus assay is intended as an aid in the diagnosis of influenza from Nasopharyngeal swab specimens and should not be used as a sole basis for treatment. Nasal washings and aspirates are unacceptable for Xpert Xpress SARS-CoV-2/FLU/RSV testing.  Fact Sheet for Patients: EntrepreneurPulse.com.au  Fact Sheet for Healthcare Providers: IncredibleEmployment.be  This test is not yet approved or cleared by the [place] FDA and has been authorized for detection and/or diagnosis of SARS-CoV-2 by FDA under an Emergency Use Authorization (EUA). This EUA will remain in effect (meaning this test can be used) for the duration of the COVID-19 declaration under Section 564(b)(1) of the Act, 21 U.S.C. section 360bbb-3(b)(1), unless the authorization is terminated or revoked.  Performed at [facility], [place] [address], [place], [place] [address]      Time coordinating discharge: 35 minutes  SIGNED:   Aline August, MD  [facility] 06/13/2021, 10:55 AM

## 2021-06-13 NOTE — Progress Notes (Signed)
Patient ID: John Shields, male   DOB: February 02, 1946, 76 y.o.   MRN: [id number]  PROGRESS NOTE    John Shields  WNI:[id number] DOB: 01-23-1946 DOA: 06/09/2021 PCP: [facility]   Brief Narrative:  76 y.o. male with medical history significant for type 2 diabetes mellitus, essential hypertension, unprovoked acute pulmonary embolism in 2021 currently on Eliquis, BPH presented with generalized weakness and fall at home.  On presentation, CPK was 1479.  He was started on IV fluids.  PT recommended SNF placement.  Assessment & Plan:   Mild rhabdomyolysis Generalized weakness Fall at home Dehydration -Patient presented with worsening generalized weakness, fall at home and was found to have CPK of 4079.  Treated with IV fluids.  CK level normalized on 06/12/2021.  DC'd IV fluids on 06/12/2021. -PT recommended SNF placement.  Social worker following.  Mild transaminitis -Possibly from above.  Improved.  Diabetes mellitus type 2 -Metformin on hold.  Continue CBGs with SSI.  Hypertension -Blood pressure stable.  Continue metoprolol  History of unprovoked PE in 2021 -Continue Eliquis  BPH -Continue Flomax  DVT prophylaxis: Eliquis Code Status: Full Family Communication: None at bedside Disposition Plan: Status is: Inpatient  Remains inpatient appropriate because: Of need for SNF placement.  Currently medically safe for discharge  Consultants: None  Procedures: None  Antimicrobials: None   Subjective: Patient seen and examined at bedside.  Denies fever, vomiting, chest pain.  Still feels weak.  Appetite is good. Objective: Vitals:   06/12/21 0836 06/12/21 1651 06/12/21 2005 06/13/21 0448  BP: (!) 150/69 (!) 139/57 140/77 (!) 155/83  Pulse: 76 73 79 73  Resp: 18 17 18 18   Temp: 98.4 F (36.9 C) 98.7 F (37.1 C) 98 F (36.7 C) 98.5 F (36.9 C)  TempSrc: Oral  Oral Oral  SpO2: 93% 94% 97% 97%  Weight:      Height:        Intake/Output Summary (Last 24 hours)  at 06/13/2021 0806 Last data filed at 06/13/2021 0515 Gross per 24 hour  Intake 1080 ml  Output 2500 ml  Net -1420 ml    Filed Weights   06/10/21 1718 06/12/21 0520  Weight: 101.2 kg 106 kg    Examination:  General exam: No acute distress.  Currently on room air.   Respiratory system: Bilateral decreased breath sounds at bases with some crackles  cardiovascular system: S1-S2 heard; rate controlled  gastrointestinal system: Abdomen is distended slightly; soft and nontender.  Normal bowel sounds heard  extremities: No cyanosis; trace lower extremity edema present  Data Reviewed: I have personally reviewed following labs and imaging studies  CBC: Recent Labs  Lab 06/10/21 0111 06/10/21 0422  WBC 9.9 9.9  NEUTROABS 5.7  --   HGB 13.1 12.9*  HCT 42.3 42.3  MCV 73.4* 74.9*  PLT 214 381    Basic Metabolic Panel: Recent Labs  Lab 06/10/21 0111 06/10/21 0422 06/11/21 0231 06/12/21 0736  NA 138 138 138 141  K 4.2 4.8 4.4 4.2  CL 106 110 107 105  CO2 25 21* 25 27  GLUCOSE 111* 100* 101* 102*  BUN 27* 26* 19 20  CREATININE 1.18 1.09 0.97 0.98  CALCIUM 8.8* 8.3* 8.4* 8.7*  MG  --  2.3 2.1 2.0  PHOS  --  3.5  --   --     GFR: Estimated Creatinine Clearance: 82 mL/min (by C-G formula based on SCr of 0.98 mg/dL). Liver Function Tests: Recent Labs  Lab 06/10/21 0111 06/10/21 0422 06/11/21  0231 06/12/21 0736  AST 67* 61* 43* 36  ALT 36 30 30 27   ALKPHOS 53 48 45 51  BILITOT 0.9 0.7 0.6 0.7  PROT 7.2 6.6 5.8* 6.2*  ALBUMIN 4.0 3.5 3.1* 3.2*    No results for input(s): LIPASE, AMYLASE in the last 168 hours. No results for input(s): AMMONIA in the last 168 hours. Coagulation Profile: No results for input(s): INR, PROTIME in the last 168 hours. Cardiac Enzymes: Recent Labs  Lab 06/10/21 0111 06/10/21 0422 06/11/21 0907 06/12/21 0736  CKTOTAL 1,479* 1,140* 683* 361    BNP (last 3 results) No results for input(s): PROBNP in the last 8760 hours. HbA1C: No  results for input(s): HGBA1C in the last 72 hours. CBG: Recent Labs  Lab 06/12/21 0749 06/12/21 1128 06/12/21 1650 06/12/21 2207 06/13/21 0643  GLUCAP 114* 125* 121* 111* 109*    Lipid Profile: No results for input(s): CHOL, HDL, LDLCALC, TRIG, CHOLHDL, LDLDIRECT in the last 72 hours. Thyroid Function Tests: Recent Labs    06/10/21 1739  TSH 0.671    Anemia Panel: No results for input(s): VITAMINB12, FOLATE, FERRITIN, TIBC, IRON, RETICCTPCT in the last 72 hours. Sepsis Labs: No results for input(s): PROCALCITON, LATICACIDVEN in the last 168 hours.  Recent Results (from the past 240 hour(s))  Resp Panel by RT-PCR (Flu A&B, Covid) Nasopharyngeal Swab     Status: None   Collection Time: 06/09/21 10:09 PM   Specimen: Nasopharyngeal Swab; Nasopharyngeal(NP) swabs in vial transport medium  Result Value Ref Range Status   SARS Coronavirus 2 by RT PCR NEGATIVE NEGATIVE Final    Comment: (NOTE) SARS-CoV-2 target nucleic acids are NOT DETECTED.  The SARS-CoV-2 RNA is generally detectable in upper respiratory specimens during the acute phase of infection. The lowest concentration of SARS-CoV-2 viral copies this assay can detect is 138 copies/mL. A negative result does not preclude SARS-Cov-2 infection and should not be used as the sole basis for treatment or other patient management decisions. A negative result may occur with  improper specimen collection/handling, submission of specimen other than nasopharyngeal swab, presence of viral mutation(s) within the areas targeted by this assay, and inadequate number of viral copies(<138 copies/mL). A negative result must be combined with clinical observations, patient history, and epidemiological information. The expected result is Negative.  Fact Sheet for Patients:  EntrepreneurPulse.com.au  Fact Sheet for Healthcare Providers:  IncredibleEmployment.be  This test is no t yet approved or  cleared by the [place] FDA and  has been authorized for detection and/or diagnosis of SARS-CoV-2 by FDA under an Emergency Use Authorization (EUA). This EUA will remain  in effect (meaning this test can be used) for the duration of the COVID-19 declaration under Section 564(b)(1) of the Act, 21 U.S.C.section 360bbb-3(b)(1), unless the authorization is terminated  or revoked sooner.       Influenza A by PCR NEGATIVE NEGATIVE Final   Influenza B by PCR NEGATIVE NEGATIVE Final    Comment: (NOTE) The Xpert Xpress SARS-CoV-2/FLU/RSV plus assay is intended as an aid in the diagnosis of influenza from Nasopharyngeal swab specimens and should not be used as a sole basis for treatment. Nasal washings and aspirates are unacceptable for Xpert Xpress SARS-CoV-2/FLU/RSV testing.  Fact Sheet for Patients: EntrepreneurPulse.com.au  Fact Sheet for Healthcare Providers: IncredibleEmployment.be  This test is not yet approved or cleared by the [place] FDA and has been authorized for detection and/or diagnosis of SARS-CoV-2 by FDA under an Emergency Use Authorization (EUA). This EUA will remain in  effect (meaning this test can be used) for the duration of the COVID-19 declaration under Section 564(b)(1) of the Act, 21 U.S.C. section 360bbb-3(b)(1), unless the authorization is terminated or revoked.  Performed at [facility], [place] [address], [place], [place] [address]           Radiology Studies: No results found.      Scheduled Meds:  apixaban  5 mg Oral BID   insulin aspart  0-6 Units Subcutaneous TID WC   metoprolol tartrate  25 mg Oral Daily   tamsulosin  0.4 mg Oral Daily   Continuous Infusions:          John August, MD [facility] 06/13/2021, 8:06 AM

## 2021-06-13 NOTE — TOC Transition Note (Signed)
Transition of Care [facility]) - CM/SW Discharge Note   Patient Details  Name: Areon Cocuzza MRN: [id number] Date of Birth: September 23, 1945  [facility] CM/SW Contact:  Milinda Antis, LCSWA Phone Number: 06/13/2021, 1:50 PM   Clinical Narrative:    Patient will DC to:  [facility] Anticipated DC date: 06/13/2021 Family notified: Patient requested that friend be notified.  Friend notified at bedside Transport by: Corey Harold   Per MD patient ready for DC to SNF. RN to call report prior to discharge [phone]. RN, patient, patient's friend, and facility notified of DC. Discharge Summary and FL2 sent to facility. DC packet on chart. Ambulance transport requested for patient.   CSW will sign off for now as social work intervention is no longer needed. Please consult [place] again if new needs arise.     Final next level of care: Skilled Nursing Facility Barriers to Discharge: Barriers Resolved   Patient Goals and CMS Choice Patient states their goals for this hospitalization and ongoing recovery are:: Rehab and return home CMS Medicare.gov Compare Post Acute Care list provided to:: Patient Choice offered to / list presented to : Patient  Discharge Placement              Patient chooses bed at: [facility] Patient to be transferred to facility by: [place] Name of family member notified: Patient alert and oriented, patient asked that friend Legrand Como be notified Patient and family notified of of transfer: 06/13/21  Discharge Plan and Services In-house Referral: Clinical Social Work   Post Acute Care Choice: [facility]                               Social Determinants of Health (SDOH) Interventions     Readmission Risk Interventions No flowsheet data found.

## 2021-06-15 LAB — METHYLMALONIC ACID, SERUM: Methylmalonic Acid, Quantitative: 206 nmol/L (ref 0–378)

## 2021-07-27 ENCOUNTER — Emergency Department (HOSPITAL_COMMUNITY): Payer: No Typology Code available for payment source

## 2021-07-27 ENCOUNTER — Emergency Department (HOSPITAL_COMMUNITY)
Admission: EM | Admit: 2021-07-27 | Discharge: 2021-07-28 | Disposition: A | Discharge status: Home / Self Care | Payer: No Typology Code available for payment source | Attending: Emergency Medicine | Admitting: Emergency Medicine

## 2021-07-27 ENCOUNTER — Other Ambulatory Visit: Payer: Self-pay

## 2021-07-27 DIAGNOSIS — Z7901 Long term (current) use of anticoagulants: Secondary | ICD-10-CM | POA: Diagnosis not present

## 2021-07-27 DIAGNOSIS — R531 Weakness: Secondary | ICD-10-CM | POA: Insufficient documentation

## 2021-07-27 DIAGNOSIS — Z8546 Personal history of malignant neoplasm of prostate: Secondary | ICD-10-CM | POA: Diagnosis not present

## 2021-07-27 LAB — CBC WITH DIFFERENTIAL/PLATELET
Abs Immature Granulocytes: 0.03 10*3/uL (ref 0.00–0.07)
Basophils Absolute: 0 10*3/uL (ref 0.0–0.1)
Basophils Relative: 0 %
Eosinophils Absolute: 0.1 10*3/uL (ref 0.0–0.5)
Eosinophils Relative: 2 %
HCT: 41.8 % (ref 39.0–52.0)
Hemoglobin: 12.8 g/dL — ABNORMAL LOW (ref 13.0–17.0)
Immature Granulocytes: 0 %
Lymphocytes Relative: 23 %
Lymphs Abs: 1.9 10*3/uL (ref 0.7–4.0)
MCH: 22.8 pg — ABNORMAL LOW (ref 26.0–34.0)
MCHC: 30.6 g/dL (ref 30.0–36.0)
MCV: 74.4 fL — ABNORMAL LOW (ref 80.0–100.0)
Monocytes Absolute: 0.8 10*3/uL (ref 0.1–1.0)
Monocytes Relative: 9 %
Neutro Abs: 5.4 10*3/uL (ref 1.7–7.7)
Neutrophils Relative %: 66 %
Platelets: 226 10*3/uL (ref 150–400)
RBC: 5.62 MIL/uL (ref 4.22–5.81)
RDW: 14.7 % (ref 11.5–15.5)
WBC: 8.2 10*3/uL (ref 4.0–10.5)
nRBC: 0 % (ref 0.0–0.2)

## 2021-07-27 LAB — COMPREHENSIVE METABOLIC PANEL
ALT: 30 U/L (ref 0–44)
AST: 30 U/L (ref 15–41)
Albumin: 3.6 g/dL (ref 3.5–5.0)
Alkaline Phosphatase: 51 U/L (ref 38–126)
Anion gap: 7 (ref 5–15)
BUN: 16 mg/dL (ref 8–23)
CO2: 27 mmol/L (ref 22–32)
Calcium: 8.9 mg/dL (ref 8.9–10.3)
Chloride: 103 mmol/L (ref 98–111)
Creatinine, Ser: 1.15 mg/dL (ref 0.61–1.24)
GFR, Estimated: >60 mL/min (ref >60)
Glucose, Bld: 114 mg/dL — ABNORMAL HIGH (ref 70–99)
Potassium: 4.3 mmol/L (ref 3.5–5.1)
Sodium: 137 mmol/L (ref 135–145)
Total Bilirubin: 0.4 mg/dL (ref 0.3–1.2)
Total Protein: 6.5 g/dL (ref 6.5–8.1)

## 2021-07-27 LAB — CK: Total CK: 69 U/L (ref 49–397)

## 2021-07-27 NOTE — Progress Notes (Signed)
Transition of Care [facility]) - Emergency Department Mini Assessment ? ? ?Patient Details  ?Name: John Shields ?MRN: [id number] ?Date of Birth: 1945/12/26 ? ?Transition of Care (TOC) CM/SW Contact:    ?Laurena Slimmer, RN ?Phone Number: ?07/27/2021, 9:58 PM ? ? ?Clinical Narrative: ? Patient A&O x 3 who lives alone was discharged  from [facility] yesterday. Patient reports he fell out of bed this morning. Patient states he walked with walker prior to hospitalization. Discussed [place] services patient is agreeable, noted patient to be active with [facility] in the past. Referral sent to [facility] for HHRN,PT,OT, and HHA. Patient also thinks he would benefit from a wheelchair for mobility. Orders was placed and sent into [facility]. Updated Dr. Darl Householder nd Kae Heller RN on transitional care plan. No further ED RNCM needs identified . ? ? ?ED Mini Assessment: ?What brought you to the Emergency Department? : (Pended) discharge from [facility] yesterday and fell out of bed today. ? ?  ? ?  ? ?Means of departure: (Pended) Ambulance PTAR ? ?Interventions which prevented an admission or readmission: (Pended) [place] or Services ? ?SDOH reviewed with [patient  ? ?Patient Contact and Communications ?  ?  ?  ? ,     ?  ?  ? ?  ?CMS Medicare.gov Compare Post Acute Care list provided to:: (Pended) Patient ?  ? ?Admission diagnosis:  L leg weakness ?Patient Active Problem List  ? Diagnosis Date Noted  ? Rhabdomyolysis 06/10/2021  ? Dehydration 06/10/2021  ? Generalized weakness 06/10/2021  ? Acute prerenal azotemia 06/10/2021  ? Acute pulmonary embolism ([place]) 04/05/2019  ? Gross hematuria 04/05/2019  ? Diabetes mellitus type II, non insulin dependent ([place]) 04/05/2019  ? Leg hematoma, left, initial encounter 04/05/2019  ? Hematoma of left lower extremity   ? Dizziness 03/21/2019  ? History of CVA (cerebrovascular accident)   ? History of aneurysm   ? Dyslipidemia   ? Acute left-sided weakness 09/01/2017  ? Acute lower UTI  09/01/2017  ? Cerebral thrombosis with cerebral infarction 09/01/2017  ? Near syncope 07/25/2017  ? Prostate cancer, primary, 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 [facility])   ? Left bundle branch block (LBBB) on electrocardiogram   ? H/O bilateral orchiectomies   ? Dyspnea on exertion   ? BPH (benign prostatic hyperplasia) 12/27/2016  ? Renal mass 12/27/2016  ? Thrombocytopenia ([place]) 12/27/2016  ? Sepsis ([place]) 12/27/2016  ? Elevated LFTs   ? Lactic acidosis   ? AKI (acute kidney injury) ([place])   ? Urinary retention 06/07/2016  ? Constipation 06/07/2016  ? Acute renal failure (ARF) ([place]) 06/07/2016  ? Essential hypertension 06/07/2016  ? Hyperglycemia 06/07/2016  ? ?PCP:  [facility] ?Pharmacy:   ?[facility], [place] [address] ?[phone] ?[place] [address] ?Phone: [phone] Fax: [phone] ? ?[facility] ?[address] ?[place] [address] ?Phone: [phone] Fax: [phone] ?  ?

## 2021-07-27 NOTE — ED Notes (Signed)
[facility] called patient is number 2 on the list ?

## 2021-07-27 NOTE — Discharge Instructions (Addendum)
I have gotten in touch with social worker to ensure that you have a home health. ?There will also be a wheelchair delivered to you to help with assistance. ?Follow-up with your primary care provider. ?Return to the ER if you start to experience worsening symptoms, additional injuries, severe headache, blurry vision, chest pain. ?
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
## 2021-07-28 ENCOUNTER — Telehealth: Payer: Self-pay | Admitting: *Deleted

## 2021-07-28 NOTE — ED Notes (Signed)
[facility] arrives to transport patient. Patient transfers to stretcher via stand and pivot. Secured via Medical illustrator. Pt seen exiting lobby on stretcher without incident. ?

## 2021-07-28 NOTE — Telephone Encounter (Signed)
RNCM contacted pt Social Worker Lajuana Matte Damita Dunnings) with [employer] regarding pt requiring DME wheelchair.  Berttina requested order be faxed to MD office [phone] and she would review with MD and have DME sent to pt home. ?

## 2021-10-08 ENCOUNTER — Emergency Department (HOSPITAL_COMMUNITY)
Admission: EM | Admit: 2021-10-08 | Discharge: 2021-10-13 | Disposition: A | Discharge status: Home / Self Care | Payer: Medicare Other | Attending: Emergency Medicine | Admitting: Emergency Medicine

## 2021-10-08 ENCOUNTER — Other Ambulatory Visit: Payer: Self-pay

## 2021-10-08 ENCOUNTER — Encounter (HOSPITAL_COMMUNITY): Payer: Self-pay | Admitting: Emergency Medicine

## 2021-10-08 DIAGNOSIS — R42 Dizziness and giddiness: Secondary | ICD-10-CM | POA: Diagnosis not present

## 2021-10-08 DIAGNOSIS — R7989 Other specified abnormal findings of blood chemistry: Secondary | ICD-10-CM | POA: Diagnosis not present

## 2021-10-08 DIAGNOSIS — E1165 Type 2 diabetes mellitus with hyperglycemia: Secondary | ICD-10-CM | POA: Insufficient documentation

## 2021-10-08 DIAGNOSIS — R531 Weakness: Secondary | ICD-10-CM | POA: Diagnosis not present

## 2021-10-08 DIAGNOSIS — Z7901 Long term (current) use of anticoagulants: Secondary | ICD-10-CM | POA: Insufficient documentation

## 2021-10-08 DIAGNOSIS — Z8546 Personal history of malignant neoplasm of prostate: Secondary | ICD-10-CM | POA: Insufficient documentation

## 2021-10-08 LAB — COMPREHENSIVE METABOLIC PANEL
ALT: 24 U/L (ref 0–44)
AST: 65 U/L — ABNORMAL HIGH (ref 15–41)
Albumin: 4 g/dL (ref 3.5–5.0)
Alkaline Phosphatase: 70 U/L (ref 38–126)
Anion gap: 10 (ref 5–15)
BUN: 13 mg/dL (ref 8–23)
CO2: 29 mmol/L (ref 22–32)
Calcium: 9.7 mg/dL (ref 8.9–10.3)
Chloride: 101 mmol/L (ref 98–111)
Creatinine, Ser: 1.26 mg/dL — ABNORMAL HIGH (ref 0.61–1.24)
GFR, Estimated: 59 mL/min — ABNORMAL LOW (ref >60)
Glucose, Bld: 131 mg/dL — ABNORMAL HIGH (ref 70–99)
Potassium: 3.8 mmol/L (ref 3.5–5.1)
Sodium: 140 mmol/L (ref 135–145)
Total Bilirubin: 1 mg/dL (ref 0.3–1.2)
Total Protein: 7.9 g/dL (ref 6.5–8.1)

## 2021-10-08 LAB — CBC
HCT: 44.5 % (ref 39.0–52.0)
Hemoglobin: 14.2 g/dL (ref 13.0–17.0)
MCH: 22.9 pg — ABNORMAL LOW (ref 26.0–34.0)
MCHC: 31.9 g/dL (ref 30.0–36.0)
MCV: 71.8 fL — ABNORMAL LOW (ref 80.0–100.0)
Platelets: 231 10*3/uL (ref 150–400)
RBC: 6.2 MIL/uL — ABNORMAL HIGH (ref 4.22–5.81)
RDW: 15.7 % — ABNORMAL HIGH (ref 11.5–15.5)
WBC: 8.7 10*3/uL (ref 4.0–10.5)
nRBC: 0 % (ref 0.0–0.2)

## 2021-10-08 LAB — TROPONIN I (HIGH SENSITIVITY)
Troponin I (High Sensitivity): 13 ng/L (ref <18)
Troponin I (High Sensitivity): 14 ng/L (ref <18)

## 2021-10-08 LAB — CBG MONITORING, ED: Glucose-Capillary: 111 mg/dL — ABNORMAL HIGH (ref 70–99)

## 2021-10-08 MED ORDER — APIXABAN 5 MG PO TABS
5.0000 mg | ORAL_TABLET | Freq: Once | ORAL | Status: AC
  Administered 2021-10-08: 5 mg via ORAL
  Filled 2021-10-08: qty 1

## 2021-10-08 NOTE — ED Triage Notes (Signed)
Pt BIB [facility] from home c/o weakness and dizziness. Pt states he tried to go to his apartment office and got weak and lowered himself to the ground. Someone found him and called EMS.  ? ?
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
## 2021-10-08 NOTE — ED Notes (Signed)
PA at bedside.

## 2021-10-08 NOTE — Discharge Instructions (Addendum)
It was a pleasure taking care of you today.  I am not quite sure what caused your weakness.  We completed a number of tests which all came back within normal limits.  We gave you your nighttime Eliquis medication follow-up tomorrow.  This is a medication that you should take every night and every morning.  Please be sure to take your blood pressure medications as well.  I did place an order for social work for home health needed so that you can hopefully get some help around her apartment.  Please be sure to follow-up with your primary care provider.  If your weakness returns or you have any issues please return to the emergency department. ?

## 2021-10-09 DIAGNOSIS — R531 Weakness: Secondary | ICD-10-CM | POA: Diagnosis not present

## 2021-10-09 LAB — URINALYSIS, ROUTINE W REFLEX MICROSCOPIC
Bilirubin Urine: NEGATIVE
Glucose, UA: NEGATIVE mg/dL
Ketones, ur: 5 mg/dL — AB
Leukocytes,Ua: NEGATIVE
Nitrite: NEGATIVE
Protein, ur: 100 mg/dL — AB
Specific Gravity, Urine: 1.02 (ref 1.005–1.030)
pH: 5 (ref 5.0–8.0)

## 2021-10-09 MED ORDER — APIXABAN 5 MG PO TABS
5.0000 mg | ORAL_TABLET | Freq: Two times a day (BID) | ORAL | Status: DC
  Administered 2021-10-09 – 2021-10-12 (×8): 5 mg via ORAL
  Filled 2021-10-09 (×10): qty 1

## 2021-10-09 MED ORDER — TAMSULOSIN HCL 0.4 MG PO CAPS
0.4000 mg | ORAL_CAPSULE | Freq: Every day | ORAL | Status: DC
Start: 2021-10-09 — End: 2021-10-13
  Administered 2021-10-09 – 2021-10-12 (×4): 0.4 mg via ORAL
  Filled 2021-10-09 (×6): qty 1

## 2021-10-09 MED ORDER — METOPROLOL TARTRATE 25 MG PO TABS
25.0000 mg | ORAL_TABLET | Freq: Every day | ORAL | Status: DC
Start: 2021-10-09 — End: 2021-10-13
  Administered 2021-10-09 – 2021-10-12 (×4): 25 mg via ORAL
  Filled 2021-10-09 (×4): qty 1

## 2021-10-09 MED ORDER — AMLODIPINE BESYLATE 5 MG PO TABS
2.5000 mg | ORAL_TABLET | Freq: Every day | ORAL | Status: DC
  Administered 2021-10-09 – 2021-10-12 (×4): 2.5 mg via ORAL
  Filled 2021-10-09 (×4): qty 1

## 2021-10-09 NOTE — Evaluation (Signed)
Physical Therapy Evaluation ?Patient Details ?Name: John Shields ?MRN: [id number] ?DOB: [birth year] ?Today's Date: 10/09/2021 ? ?History of Present Illness ? 76 year old male with PMH of prior PE on Eliquis, metastatic prostate cancer, T2DM presenting today for weakness and dizziness. Reports that he was walking in his apartment complex when he suddenly felt dizzy and weak.  When he noticed the weakness he lowered himself to the ground slowly.  ?Clinical Impression ?  ?Pt admitted secondary to problem above with deficits below. PTA patient was living alone and able to walk in has apartment complex. He has had multiple falls per medical record and somewhat recently went to SNF for rehab and had returned home.  Pt currently requires +2 moderate assist to get OOB (which he reports he can normally do independently), mod assist to stand, and min assist to very slowly ambulate 30 feet (indicative of high fall risk). His primary limitations are his decreased strength, decreased basic functional mobility (bed mobility and transfers), and frequent falls. Patient agrees he is not able to be home alone at this time and is agreeable to SNF for rehab with his goal to return home. Anticipate patient will benefit from PT to address problems listed below.Will continue to follow acutely to maximize functional mobility independence and safety.   ?   ?   ? ?Recommendations for follow up therapy are one component of a multi-disciplinary discharge planning process, led by the attending physician.  Recommendations may be updated based on patient status, additional functional criteria and insurance authorization. ? ?Follow Up Recommendations Skilled nursing-short term rehab (<3 hours/day) ? ?  ?Assistance Recommended at Discharge Intermittent Supervision/Assistance  ?Patient can return home with the following ? A lot of help with walking and/or transfers;Assistance with cooking/housework ? ?  ?Equipment Recommendations Other (comment)  (ultimately an electric scooter or power chair could benefit pt, but he is not at a point where he could even transfer on/off yet)  ?Recommendations for Other Services ? OT consult  ?  ?Functional Status Assessment Patient has had a recent decline in their functional status and demonstrates the ability to make significant improvements in function in a reasonable and predictable amount of time.  ? ?  ?Precautions / Restrictions Precautions ?Precautions: Fall ?Precaution Comments: Reports multiple episodes of lowering himself to the ground or falling due to weakness  ? ?  ? ?Mobility ? Bed Mobility ?Overal bed mobility: Needs Assistance ?Bed Mobility: Supine to Sit, Sit to Supine ?  ?  ?Supine to sit: Mod assist, +2 for physical assistance, HOB elevated ?Sit to supine: Mod assist, +2 for physical assistance ?  ?General bed mobility comments: pt able to move legs over EOB, however needs assist to raise torso (despite HOB elevated and use of rail) and +2 mod assist to scoot to edge of stretcher to get feet to the floor; return to supine assist to raise legs, instructional cues and assist to move to center of stretcher and move up in bed ?  ? ?Transfers ?Overall transfer level: Needs assistance ?Equipment used: 1 person hand held assist ?Transfers: Sit to/from Stand ?Sit to Stand: Mod assist, From elevated surface ?  ?  ?  ?  ?  ?General transfer comment: from elevated ED stretcher; return to sitting uncontrolled with pt "flopping" and falling backwards "I have to do it that way, I can't control it" ?  ? ?Ambulation/Gait ?Ambulation/Gait assistance: Min assist ?Gait Distance (Feet): 30 Feet ?Assistive device: None ?Gait Pattern/deviations: Shuffle ?Gait velocity: extremely slow ?Gait velocity  interpretation: <1.31 ft/sec, indicative of household ambulator ?  ?General Gait Details: no foot clearance with shoes scuffing along the floor; very short step length bil; 12 steps to turn 180* ? ?Stairs ?  ?  ?  ?  ?   ? ?Wheelchair Mobility ?  ? ?Modified Rankin (Stroke Patients Only) ?  ? ?  ? ?Balance Overall balance assessment: History of Falls, Needs assistance ?Sitting-balance support: Bilateral upper extremity supported, Feet supported ?Sitting balance-Leahy Scale: Poor ?Sitting balance - Comments: mild left and posterior lean ?  ?Standing balance support: No upper extremity supported ?Standing balance-Leahy Scale: Fair ?Standing balance comment: unable to move his center of mass outside his BOS with very small shuffling steps ?  ?  ?  ?  ?  ?  ?  ?  ?  ?  ?  ?   ? ? ? ?Pertinent Vitals/Pain Pain Assessment ?Pain Assessment: Faces ?Faces Pain Scale: Hurts even more ?Pain Location: butt/tailbone ?Pain Descriptors / Indicators: Discomfort, Grimacing ?Pain Intervention(s): Limited activity within patient's tolerance, Repositioned  ? ? ?Home Living Family/patient expects to be discharged to:: Skilled nursing facility ?Living Arrangements: Alone ?Available Help at Discharge: Friend(s);Available PRN/intermittently ?Type of Home: Apartment ?Home Access: Level entry ?  ?  ?  ?Home Layout: One level ?Home Equipment: None ?Additional Comments: Reports he cannot use a RW to walk in his community due to too hilly and afraid it will go too fast on downhill  ?  ?Prior Function Prior Level of Function : Independent/Modified Independent ?  ?  ?  ?  ?  ?  ?Mobility Comments: reports furniture walking at home; walks to/from apartment complex office ?ADLs Comments: Patient reports taking 45 minutes to get dressed if he is leaving the house, but reports he is independent ?  ? ? ?Hand Dominance  ?   ? ?  ?Extremity/Trunk Assessment  ? Upper Extremity Assessment ?Upper Extremity Assessment: Generalized weakness ?  ? ?Lower Extremity Assessment ?Lower Extremity Assessment: Generalized weakness ?  ? ?Cervical / Trunk Assessment ?Cervical / Trunk Assessment: Kyphotic  ?Communication  ? Communication: No difficulties  ?Cognition Arousal/Alertness:  Awake/alert ?Behavior During Therapy: Flat affect ?Overall Cognitive Status: No family/caregiver present to determine baseline cognitive functioning ?  ?  ?  ?  ?  ?  ?  ?  ?  ?  ?  ?  ?  ?  ?  ?  ?General Comments: some awareness of his deficits as he agrees he is currently unable to go home alone; has been trying to get meals on wheels and an Electrical engineer ?  ?  ? ?  ?General Comments   ? ?  ?Exercises    ? ?Assessment/Plan  ?  ?PT Assessment Patient needs continued PT services  ?PT Problem List Decreased strength;Decreased balance;Decreased mobility;Decreased knowledge of use of DME;Decreased safety awareness ? ?   ?  ?PT Treatment Interventions DME instruction;Gait training;Functional mobility training;Therapeutic activities;Therapeutic exercise;Balance training;Patient/family education   ? ?PT Goals (Current goals can be found in the Care Plan section)  ?Acute Rehab PT Goals ?Patient Stated Goal: return home with electric scooter ?PT Goal Formulation: With patient ?Time For Goal Achievement: 10/23/21 ?Potential to Achieve Goals: Good ? ?  ?Frequency Min 2X/week ?  ? ? ?Co-evaluation   ?  ?  ?  ?  ? ? ?  ?AM-PAC PT "6 Clicks" Mobility  ?Outcome Measure Help needed turning from your back to your side while in a flat bed without using  bedrails?: A Lot ?Help needed moving from lying on your back to sitting on the side of a flat bed without using bedrails?: Total ?Help needed moving to and from a bed to a chair (including a wheelchair)?: Total ?Help needed standing up from a chair using your arms (e.g., wheelchair or bedside chair)?: Total ?Help needed to walk in hospital room?: A Little ?Help needed climbing 3-5 steps with a railing? : Total ?6 Click Score: 9 ? ?  ?End of Session Equipment Utilized During Treatment: Gait belt ?Activity Tolerance: Patient tolerated treatment well ?Patient left: Other (comment) (on ED stretcher in hallway) ?  ?PT Visit Diagnosis: History of falling (Z91.81);Muscle weakness  (generalized) (M62.81);Difficulty in walking, not elsewhere classified (R26.2) ?  ? ?Time: 1657-9038 ?PT Time Calculation (min) (ACUTE ONLY): 26 min ? ? ?Charges:   PT Evaluation ?$PT Eval Moderate Complexity: 1 Mod ?PT

## 2021-10-09 NOTE — ED Notes (Signed)
Per social work team, at this time SNF referral sent awaiting placement  ?

## 2021-10-09 NOTE — ED Provider Notes (Signed)
Emergency Medicine Observation Re-evaluation Note ? ?John Shields is a 76 y.o. male, seen on rounds today.  Pt initially presented to the ED for complaints of Weakness ?Currently, the patient is resting in bed. ? ?Physical Exam  ?BP (!) 147/78   Pulse 80   Temp 98 ?F (36.7 ?C) (Oral)   Resp 14   Ht 6' (1.829 m)   Wt 106 kg   SpO2 99%   BMI 31.69 kg/m?  ?Physical Exam ?General: Awake, confused ?Cardiac: Extremities well-perfused ?Lungs: Breathing is unlabored ?Psych: Disoriented, no agitation ? ?ED Course / MDM  ? ? ?I have reviewed the labs performed to date as well as medications administered while in observation.  Recent changes in the last 24 hours include patient presented to the ED yesterday for generalized weakness and difficulty with ambulation.  Following medical work-up, social work was engaged.  He is currently awaiting SNF placement ? ?Plan  ?Current plan is for SNF placement. ? John Shields is not under involuntary commitment. ? ? ?  ?Godfrey Pick, MD ?10/09/21 1313 ? ?

## 2021-10-09 NOTE — ED Notes (Signed)
Social worker at bedside.
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
## 2021-10-09 NOTE — ED Notes (Signed)
No pain

## 2021-10-09 NOTE — ED Notes (Signed)
RN received update from ED case manager. At this time awaiting PT eval for next step for discharge.  ?

## 2021-10-09 NOTE — NC FL2 (Signed)
?[place] MEDICAID FL2 LEVEL OF CARE SCREENING TOOL  ?  ? ?IDENTIFICATION  ?Patient Name: ?John Shields Birthdate: June 02, 1945 Sex: male Admission Date (Current Location): ?10/08/2021  ?[place] and [place] Number: ? [place] ?  Facility and Address:  ?[facility], [place] [address] [place], [place] [address] ?     Provider Number: ?[id number]  ?Attending Physician Name and Address:  ?No att. providers found ? Relative Name and Phone Number:  ?  ?   ?Current Level of Care: ?Other (Comment) (emergency department) Recommended Level of Care: ?[place] Prior Approval Number: ?  ? ?Date Approved/Denied: ?  PASRR Number: ?[id number] A ? ?Discharge Plan: ?Home ?  ? ?Current Diagnoses: ?Patient Active Problem List  ? Diagnosis Date Noted  ? Rhabdomyolysis 06/10/2021  ? Dehydration 06/10/2021  ? Generalized weakness 06/10/2021  ? Acute prerenal azotemia 06/10/2021  ? Acute pulmonary embolism ([place]) 04/05/2019  ? Gross hematuria 04/05/2019  ? Diabetes mellitus type II, non insulin dependent ([place]) 04/05/2019  ? Leg hematoma, left, initial encounter 04/05/2019  ? Hematoma of left lower extremity   ? Dizziness 03/21/2019  ? History of CVA (cerebrovascular accident)   ? History of aneurysm   ? Dyslipidemia   ? Acute left-sided weakness 09/01/2017  ? Acute lower UTI 09/01/2017  ? Cerebral thrombosis with cerebral infarction 09/01/2017  ? Near syncope 07/25/2017  ? Prostate cancer, primary, 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 [facility])   ? Left bundle branch block (LBBB) on electrocardiogram   ? H/O bilateral orchiectomies   ? Dyspnea on exertion   ? BPH (benign prostatic hyperplasia) 12/27/2016  ? Renal mass 12/27/2016  ? Thrombocytopenia ([place]) 12/27/2016  ? Sepsis ([place]) 12/27/2016  ? Elevated LFTs   ? Lactic acidosis   ? AKI (acute kidney injury) ([place])   ? Urinary retention 06/07/2016  ? Constipation 06/07/2016  ? Acute renal failure (ARF) ([place]) 06/07/2016  ? Essential hypertension  06/07/2016  ? Hyperglycemia 06/07/2016  ? ? ?Orientation RESPIRATION BLADDER Height & Weight   ?  ?Self, Situation, Time, Place ? Normal Continent Weight: 233 lb 11 oz (106 kg) ?Height:  6' (182.9 cm)  ?BEHAVIORAL SYMPTOMS/MOOD NEUROLOGICAL BOWEL NUTRITION STATUS  ?    Continent    ?AMBULATORY STATUS COMMUNICATION OF NEEDS Skin   ?Extensive Assist Verbally Normal ?  ?  ?  ?    ?     ?     ? ? ?Personal Care Assistance Level of Assistance  ?Bathing, Feeding, Dressing Bathing Assistance: Limited assistance ?Feeding assistance: Independent ?Dressing Assistance: Limited assistance ?   ? ?Functional Limitations Info  ?    ?  ?   ? ? ?SPECIAL CARE FACTORS FREQUENCY  ?PT (By licensed PT), OT (By licensed OT)   ?  ?PT Frequency: 5x weekly ?OT Frequency: 5x weekly ?  ?  ?  ?   ? ? ?Contractures    ? ? ?Additional Factors Info  ?Code Status, Allergies Code Status Info: full ?Allergies Info: NKDA ?  ?  ?  ?   ? ?Current Medications (10/09/2021):  This is the current hospital active medication list ?No current facility-administered medications for this encounter.  ? ?Current Outpatient Medications  ?Medication Sig Dispense Refill  ? aspirin 325 MG EC tablet Take 650 mg by mouth daily.    ? Multiple Vitamins-Minerals (CENTRUM SILVER 50+MEN) TABS Take 1 tablet by mouth daily.    ? amLODipine (NORVASC) 2.5 MG tablet Take 2.5 mg by mouth daily. (  Patient not taking: Reported on 10/08/2021)    ? apixaban (ELIQUIS) 5 MG TABS tablet Take 1 tablet (5 mg total) by mouth 2 (two) times daily. (Patient not taking: Reported on 10/08/2021) 60 tablet 6  ? atorvastatin (LIPITOR) 40 MG tablet Take 40 mg by mouth daily. (Patient not taking: Reported on 10/08/2021)    ? hydrALAZINE (APRESOLINE) 25 MG tablet Take by mouth daily. (Patient not taking: Reported on 10/08/2021)    ? hydrochlorothiazide (HYDRODIURIL) 25 MG tablet Take 25 mg by mouth daily. (Patient not taking: Reported on 10/08/2021)    ? metoprolol tartrate (LOPRESSOR) 25 MG tablet Take 25 mg  by mouth daily. (Patient not taking: Reported on 10/08/2021)    ? tamsulosin (FLOMAX) 0.4 MG CAPS capsule Take 0.4 mg by mouth daily.  (Patient not taking: Reported on 10/08/2021)    ? ? ? ?Discharge Medications: ?Please see discharge summary for a list of discharge medications. ? ?Relevant Imaging Results: ? ?Relevant Lab Results: ? ? ?Additional Information ?SSN: [id number]. Pfizer vaccines 08/08/19, 09/01/19 ? ?Alfredia Ferguson, LCSW ? ? ? ? ?

## 2021-10-09 NOTE — ED Notes (Signed)
PT at bedside.

## 2021-10-10 DIAGNOSIS — R531 Weakness: Secondary | ICD-10-CM | POA: Diagnosis not present

## 2021-10-10 NOTE — ED Notes (Signed)
Breakfast order placed ?

## 2021-10-10 NOTE — Progress Notes (Signed)
CSW updated patient and let him know she sent the SNF referral to the [facility] for approval. Patient stated the [place] will approve it. CSW stated she has to wait for them to contact CSW will the approval. Patient was also fine with discharging to [facility] who accepts VA benefits if [place] approves him for SNF.  ?

## 2021-10-10 NOTE — ED Provider Notes (Signed)
Emergency Medicine Observation Re-evaluation Note ? ?John Shields is a 77 y.o. male, seen on rounds today.  Pt initially presented to the ED for complaints of Weakness ?Currently, the patient is resting comfortably in bed. ? ?Physical Exam  ?BP 120/75 (BP Location: Right Arm)   Pulse 86   Temp 98 ?F (36.7 ?C) (Oral)   Resp 18   Ht 6' (1.829 m)   Wt 106 kg   SpO2 95%   BMI 31.69 kg/m?  ?Physical Exam ?General: Nontoxic appearance ?Cardiac: Normal heart rate ?Lungs: Normal respiratory rate ?Psych: No internal responsiveness ? ?ED Course / MDM  ?EKG:EKG Interpretation ? ?Date/Time:  Saturday Oct 08 2021 15:29:28 EDT ?Ventricular Rate:  89 ?PR Interval:  200 ?QRS Duration: 152 ?QT Interval:  404 ?QTC Calculation: 491 ?R Axis:   -61 ?Text Interpretation: Normal sinus rhythm Possible Left atrial enlargement Right bundle branch block Left anterior fascicular block ** Bifascicular block ** Left ventricular hypertrophy ( R in aVL , [place]-Estes ) Cannot rule out Septal infarct , age undetermined Abnormal ECG When compared with ECG of 27-Jul-2021 17:10, PREVIOUS ECG IS PRESENT Confirmed by Elnora Morrison [id number]) on 10/08/2021 6:48:28 PM ? ?I have reviewed the labs performed to date as well as medications administered while in observation.  Recent changes in the last 24 hours include none.  Social worker states they have been unable to place the patient.  He has failed treatment at home by home health services.  PT do not feel he can manage himself, currently. ? ?Plan  ?Current plan is for placement into higher level of care. ? John Shields is not under involuntary commitment. ? ? ?  ?Daleen Bo, MD ?10/10/21 1640 ? ?

## 2021-10-10 NOTE — Progress Notes (Signed)
CSW faxed SNF referral to the [place]. The [facility] will determine if patient will qualify for SNF placement. Patient is not able to use his medicare A/B due to the wavier ending. Patient would have to be admitted into the hospital for a 3 night stay in order to use their Medicare insurance ?Benefits.   ?

## 2021-10-10 NOTE — Progress Notes (Signed)
CSW received a call from [facility] from the [place] who told CSW that she received the referral but unfortunately patient is not service connected. CSW was told that they cannot pay or cover any SNF placement. [place] told CSW they recommend people to use a secondary insurance. CSW explained that patient has Medicare A/B and they ended the wavier so patient would have to be admitted into the hospital for a 3 night qualifying stay. CSW stated at this time patient does not meet criteria to be admitted.  ? ? ?  ?

## 2021-10-10 NOTE — ED Notes (Signed)
Partner Eyvonne Mechanic [phone] would like an update asap ?

## 2021-10-10 NOTE — Progress Notes (Signed)
CSW spoke with Verdene Lennert of [employer] who will review tomorrow for [facility] eligibility.  ?TOC director [employer] updated. ?

## 2021-10-11 ENCOUNTER — Encounter (HOSPITAL_COMMUNITY): Payer: Self-pay

## 2021-10-11 DIAGNOSIS — R531 Weakness: Secondary | ICD-10-CM | POA: Diagnosis not present

## 2021-10-11 NOTE — Progress Notes (Signed)
CSW contacted [employer] who stated they completed the medicaid screening and sent patients information to DSS. It can take 3-4 weeks for DSS to make a decision on whether or not patient will qualify for Medicaid. CSW contacted [facility] leadership who agreed to do a LOG for patient for 30 days. CSW contacted Debbie with admissions at [facility]. Jackelyn Poling stated she will speak with her regional director Claiborne Billings and get back to [place].  ?
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
## 2021-10-11 NOTE — Progress Notes (Signed)
Physical Therapy Treatment ?Patient Details ?Name: John Shields ?MRN: [id number] ?DOB: 05-13-1946 ?Today's Date: 10/11/2021 ? ? ?History of Present Illness 76 year old male with PMH of prior PE on Eliquis, metastatic prostate cancer, T2DM presenting today for weakness and dizziness. Reports that he was walking in his apartment complex when he suddenly felt dizzy and weak.  When he noticed the weakness he lowered himself to the ground slowly. ? ?  ?PT Comments  ? ? Pt progressing towards goals. Pt agreeable to transferring to chair this session. Requiring mod A for bed mobility and to stand. Pt with short shuffled steps to chair and requiring min A for steadying to get to chair. Current recommendations for SNF appropriate given level of assist required for transfers. Will continue to follow acutely.  ?   ?Recommendations for follow up therapy are one component of a multi-disciplinary discharge planning process, led by the attending physician.  Recommendations may be updated based on patient status, additional functional criteria and insurance authorization. ? ?Follow Up Recommendations ? Skilled nursing-short term rehab (<3 hours/day) ?  ?  ?Assistance Recommended at Discharge Intermittent Supervision/Assistance  ?Patient can return home with the following A lot of help with walking and/or transfers;Assistance with cooking/housework ?  ?Equipment Recommendations ? Other (comment) (ultimately an electric scooter or power chair could benefit pt, but he is not at a point where he could even transfer on/off yet)  ?  ?Recommendations for Other Services OT consult ? ? ?  ?Precautions / Restrictions Precautions ?Precautions: Fall ?Precaution Comments: Reports multiple episodes of lowering himself to the ground or falling due to weakness ?Restrictions ?Weight Bearing Restrictions: No  ?  ? ?Mobility ? Bed Mobility ?Overal bed mobility: Needs Assistance ?Bed Mobility: Supine to Sit ?  ?  ?Supine to sit: Mod assist, HOB  elevated ?  ?  ?General bed mobility comments: Mod A for trunk assist and scooting hips to EOB. ?  ? ?Transfers ?Overall transfer level: Needs assistance ?Equipment used: 1 person hand held assist ?Transfers: Sit to/from Stand, Bed to chair/wheelchair/BSC ?Sit to Stand: Mod assist, From elevated surface ?  ?Step pivot transfers: Min assist ?  ?  ?  ?General transfer comment: Pt only wanting to get to chair this session as his breakfast had just arrived. Mod A for lift assist and steadying to stand. Pt requiring min A during transfer to chair. Very short shuffled steps. ?  ? ?Ambulation/Gait ?  ?  ?  ?  ?  ?  ?  ?  ? ? ?Stairs ?  ?  ?  ?  ?  ? ? ?Wheelchair Mobility ?  ? ?Modified Rankin (Stroke Patients Only) ?  ? ? ?  ?Balance Overall balance assessment: History of Falls, Needs assistance ?Sitting-balance support: Bilateral upper extremity supported, Feet supported ?Sitting balance-Leahy Scale: Fair ?  ?  ?Standing balance support: Single extremity supported ?Standing balance-Leahy Scale: Poor ?Standing balance comment: Reliant on BUE and external support ?  ?  ?  ?  ?  ?  ?  ?  ?  ?  ?  ?  ? ?  ?Cognition Arousal/Alertness: Awake/alert ?Behavior During Therapy: Flat affect ?Overall Cognitive Status: No family/caregiver present to determine baseline cognitive functioning ?  ?  ?  ?  ?  ?  ?  ?  ?  ?  ?  ?  ?  ?  ?  ?  ?General Comments: Decreased awareness of deficits. Likely close to baseline. ?  ?  ? ?  ?  Exercises   ? ?  ?General Comments   ?  ?  ? ?Pertinent Vitals/Pain Pain Assessment ?Pain Assessment: Faces ?Faces Pain Scale: Hurts little more ?Pain Location: generalized ?Pain Descriptors / Indicators: Discomfort, Grimacing ?Pain Intervention(s): Limited activity within patient's tolerance, Monitored during session, Repositioned  ? ? ?Home Living   ?  ?  ?  ?  ?  ?  ?  ?  ?  ?   ?  ?Prior Function    ?  ?  ?   ? ?PT Goals (current goals can now be found in the care plan section) Acute Rehab PT Goals ?Patient  Stated Goal: return home with electric scooter ?PT Goal Formulation: With patient ?Time For Goal Achievement: 10/23/21 ?Potential to Achieve Goals: Good ?Progress towards PT goals: Progressing toward goals ? ?  ?Frequency ? ? ? Min 2X/week ? ? ? ?  ?PT Plan Current plan remains appropriate  ? ? ?Co-evaluation   ?  ?  ?  ?  ? ?  ?AM-PAC PT "6 Clicks" Mobility   ?Outcome Measure ? Help needed turning from your back to your side while in a flat bed without using bedrails?: A Lot ?Help needed moving from lying on your back to sitting on the side of a flat bed without using bedrails?: A Lot ?Help needed moving to and from a bed to a chair (including a wheelchair)?: A Little ?Help needed standing up from a chair using your arms (e.g., wheelchair or bedside chair)?: A Lot ?Help needed to walk in hospital room?: A Lot ?Help needed climbing 3-5 steps with a railing? : Total ?6 Click Score: 12 ? ?  ?End of Session Equipment Utilized During Treatment: Gait belt ?Activity Tolerance: Patient tolerated treatment well ?Patient left: in chair (in recliner in ED) ?Nurse Communication: Mobility status ?PT Visit Diagnosis: History of falling (Z91.81);Muscle weakness (generalized) (M62.81);Difficulty in walking, not elsewhere classified (R26.2) ?  ? ? ?Time: 9485-4627 ?PT Time Calculation (min) (ACUTE ONLY): 17 min ? ?Charges:  $Therapeutic Activity: 8-22 mins          ?          ? ?John Shields, PT, DPT  ?Acute Rehabilitation Services  ?Pager: [phone] ?Office: [phone] ? ? ? ?[place] ?10/11/2021, 11:44 AM ? ?

## 2021-10-11 NOTE — ED Notes (Signed)
Resting quietly. No complaints. No signs of distress.  ?

## 2021-10-11 NOTE — ED Notes (Signed)
Up in recliner chair with assistance from PT. No signs of distress. No complaints.  ?

## 2021-10-11 NOTE — Progress Notes (Signed)
CSW got permission to speak with patients friend, Sunday Spillers. Patient told CSW she is not allowed to speak with anyone else. Sunday Spillers stated that she is patients very good friend. Sunday Spillers stated patient was in [facility] a few months ago but patient left. Sunday Spillers stated she used to check on patient but the neighbors across the street started to look out for him. Sunday Spillers stated the neighbors across the street are questionable. Sunday Spillers stated its not her responsibility to care for patient. Sunday Spillers stated that she is paying for patients phone bill currently but told him next month he will have to pay his own bills. CSW explained that patient does not have any insurance that would cover a SNF stay. CSW stated if patient was sent home with home health would she be able to check in on him. Sunday Spillers stated yes she could.  ?

## 2021-10-11 NOTE — Progress Notes (Signed)
TOC leadership unable to approve LOG for therapy due to patient having medicare. Patients Medicare B can be billed but patient does not have the funds to pay 20% of the therapy.  ?

## 2021-10-11 NOTE — ED Notes (Signed)
Pt ambulated with shuffling gait from chair to bed with assistance by this RN. Pt given ginger ale. No acute distress noted.  ?

## 2021-10-12 DIAGNOSIS — R531 Weakness: Secondary | ICD-10-CM | POA: Diagnosis not present

## 2021-10-12 NOTE — ED Notes (Signed)
PT waiting for PTAR for transport home. Pt has house keys in his room ?

## 2021-10-12 NOTE — Progress Notes (Signed)
CSW discussed with [facility] Director Z.Rolena Infante about the barriers with patient receiving therapy. TOC director told CSW that if patient cannot pay for his therapy out of pocket that he would need to discharge home. CSW spoke to patient and he told CSW he could not cover the 20% of therapy. CSW told patient he would have to discharge home with home health. Patient told CSW he is fine with that. Patient was setup with [facility] home health. CSW asked patient if he had a walker or wheelchair at home. Patient stated he has a walker at home but he gave his wheelchair away. Patient stated he cannot use a walker or wheelchair at home. CSW asked why and he stated that the apartment property [place]. CSW asked patient if there was issues with him using the walker and wheelchair to get around inside of his apartment and he said no but he won't use it. CSW asked why he wouldn't use the walker and wheelchair and patient stated "I mean I could but I choose not to". CSW told patient she will try to provide him with another walker. Patient is not able to get one through his medicare due to patient being provided with one in March 2023 that he gave away. Case management [facility] to see if they can [facility] a [facility] wheelchair.  ? ?CSW also spoke to patients friend Sunday Spillers who stated she can deliver patients wheelchair to the home. Sunday Spillers told CSW that patient stated he didn't want a wheelchair. CSW stated she will provide him one anyway since he has no means to pay for rehab. TOC director located a wheelchair patient can have at discharge. ED provider witnessed patient ambulating without assistance to the nurses desk yesterday 10/12/21. ED tech also witnessed patient standing on his own to use bedside commode.  ?

## 2021-10-12 NOTE — ED Provider Notes (Signed)
Emergency Medicine Observation Re-evaluation Note ? ?John Shields is a 76 y.o. male, seen on rounds today.  Pt initially presented to the ED for complaints of Weakness ?Currently, the patient is resting in bed in NAD. ? ?Physical Exam  ?BP (!) 141/62 (BP Location: Right Arm)   Pulse 81   Temp 98 ?F (36.7 ?C) (Oral)   Resp 17   Ht 6' (1.829 m)   Wt 106 kg   SpO2 94%   BMI 31.69 kg/m?  ?Physical Exam ?General: NAD ?Cardiac: rrr ?Lungs: clear ?MSK:  4/5 strength bilateral lower ext. ? ?ED Course / MDM  ?EKG: ?I have reviewed the labs performed to date as well as medications administered while in observation.  Recent changes in the last 24 hours include none. ? ?Plan  ?Current plan is for pt will need to go home as he is not a candidate for SNF.  Home health was provided.  Pt does have assist devices at home.  Yesterday pt was noted to be standing and ambulating by the nurses desk without assistance of help of a device.  Awake and lucid. ? Pasqual Farias is not under involuntary commitment. ? ? ?  ?Blanchie Dessert, MD ?10/12/21 1108 ? ?

## 2021-10-12 NOTE — TOC Transition Note (Signed)
Transition of Care (TOC) - CM/SW Discharge Note ? ? ?Patient Details  ?Name: John Shields ?MRN: [id number] ?Date of Birth: [birth year] ? ?Transition of Care (TOC) CM/SW Contact:  ?Fuller Mandril, RN ?Phone Number: ?10/12/2021, 3:26 PM ? ? ?Clinical Narrative:    ?RNCM emailed completed "Urgent Items for Discharge" form to [email or web address] as requested by [facility], SW.  ? ?Final next level of care: [place] ?Barriers to Discharge: ED Unsafe disposition ? ? ?Patient Goals and CMS Choice ?  ?CMS Medicare.gov Compare Post Acute Care list provided to:: Patient ?Choice offered to / list presented to : Patient ? ?Discharge Placement ?  ?           ?  ?  ?  ?  ? ?Discharge Plan and Services ?  ?Discharge Planning Services: CM Consult ?           ?  ?  ?  ?  ?  ?HH Arranged: PT, OT ?[place] Agency: [place] ?Date HH Agency Contacted: 10/09/21 ?Time [place]: [phone] ?  ? ?Social Determinants of Health (SDOH) Interventions ?Food Insecurity Interventions:  (Meals on wheels list) ?Transportation Interventions: PTAR ([place]) ? ? ?Readmission Risk Interventions ?   ? View : No data to display.  ?  ?  ?  ? ? ? ? ? ?

## 2021-10-12 NOTE — ED Notes (Signed)
Pt waiting for PTAR for transport home.  ?

## 2021-10-12 NOTE — Progress Notes (Signed)
Pending disposition. Patient does not have proper insurance for SNF placement. Patient also cannot pay out of pocket. LOG denied by [facility] leadership due to patient having medicare. TOC leadership aware of barriers.   ?

## 2021-10-12 NOTE — ED Notes (Signed)
Pt sitting  in WC talking to staff. ?

## 2021-10-12 NOTE — Progress Notes (Signed)
Transition of Care [facility]) - Emergency Department Mini Assessment ? ? ?Patient Details  ?Name: John Shields ?MRN: [id number] ?Date of Birth: [birth year] ? ?Transition of Care (TOC) CM/SW Contact:    ?John Mandril, RN ?Phone Number: ?10/12/2021, 10:10 AM ? ? ?Clinical Narrative: ?John Shields John Shields, John Shields, [place], [place] [phone] ?RNCM spoke with pt at bedside regarding discharge planning for John Shields. Offered pt medicare.gov list of home health agencies to choose from.  Pt chose [facility] to render services. John Shields of [place] notified. Patient made aware that John Shields will be in contact in 24-48 hours.   ? ? ?ED Mini Assessment: ?What brought you to the Emergency Department? : (P) weakness ? ?Barriers to Discharge: (P) ED Unsafe disposition ? ?Barrier interventions: (P) [facility] services ? ?Means of departure: John Shields) [facility] ? ?Interventions which prevented an admission or readmission: (P) John Shields or Services ? ? ? ?Patient Contact and Communications ?  ?  ?Spoke with: John Shields) John Shields with John Shields ? ,     ?  ?  ? ?  ?CMS Medicare.gov Compare Post Acute Care list provided to:: (P) Patient ?Choice offered to / list presented to : (P) Patient ? ?Admission diagnosis:  WEAKNESS ?Patient Active Problem List  ? Diagnosis Date Noted  ? Rhabdomyolysis 06/10/2021  ? Dehydration 06/10/2021  ? Generalized weakness 06/10/2021  ? Acute prerenal azotemia 06/10/2021  ? Acute pulmonary embolism ([place]) 04/05/2019  ? Gross hematuria 04/05/2019  ? Diabetes mellitus type II, non insulin dependent ([place]) 04/05/2019  ? Leg hematoma, left, initial encounter 04/05/2019  ? Hematoma of left lower extremity   ? Dizziness 03/21/2019  ? History of CVA (cerebrovascular accident)   ? History of aneurysm   ? Dyslipidemia   ? Acute left-sided weakness 09/01/2017  ? Acute lower UTI 09/01/2017  ? Cerebral thrombosis with cerebral infarction 09/01/2017  ? Near syncope 07/25/2017  ? Prostate cancer, primary, with metastasis from prostate  to other site [facility])   ? Left bundle branch block (LBBB) on electrocardiogram   ? H/O bilateral orchiectomies   ? Dyspnea on exertion   ? BPH (benign prostatic hyperplasia) 12/27/2016  ? Renal mass 12/27/2016  ? Thrombocytopenia ([place]) 12/27/2016  ? Sepsis ([place]) 12/27/2016  ? Elevated LFTs   ? Lactic acidosis   ? AKI (acute kidney injury) ([place])   ? Urinary retention 06/07/2016  ? Constipation 06/07/2016  ? Acute renal failure (ARF) ([place]) 06/07/2016  ? Essential hypertension 06/07/2016  ? Hyperglycemia 06/07/2016  ? ?PCP:  [facility] ?Pharmacy:   ?[facility], [address] ?[phone] ?[place] [address] ?Phone: [phone] Fax: [phone] ? ?[facility] ?[address] ?[place] [address] ?Phone: [phone] Fax: [phone] ?  ?

## 2021-10-12 NOTE — Evaluation (Signed)
Occupational Therapy Evaluation Patient Details Name: John Shields MRN: [id number] DOB: 12/15/1945 Today's Date: 10/12/2021   History of Present Illness 76 year old male with PMH of prior PE on Eliquis, metastatic prostate cancer, T2DM presenting today for weakness and dizziness. Reports that he was walking in his apartment complex when he suddenly felt dizzy and weak.  When he noticed the weakness he lowered himself to the ground slowly.   Clinical Impression   Pt admitted for concerns listed above. PTA pt reported that he was fairly independent, however later he reports that he has had multiple falls and is not able to care for himself well. At this time, pt is requiring mod A for functional mobility and LB ADL's, as well as set up to min A for UB ADL's and bed mobility. Pt presents with increased weakness, balance deficits, cognitive concerns, and decreased activity tolerance. Recommending SNF at this time to maximize independence and safety before returning home. OT will follow acutely.      Recommendations for follow up therapy are one component of a multi-disciplinary discharge planning process, led by the attending physician.  Recommendations may be updated based on patient status, additional functional criteria and insurance authorization.   Follow Up Recommendations  Skilled nursing-short term rehab (<3 hours/day)    Assistance Recommended at Discharge Frequent or constant Supervision/Assistance  Patient can return home with the following A lot of help with walking and/or transfers;A lot of help with bathing/dressing/bathroom;Assistance with cooking/housework;Direct supervision/assist for medications management;Direct supervision/assist for financial management;Assist for transportation;Help with stairs or ramp for entrance    Functional Status Assessment  Patient has had a recent decline in their functional status and demonstrates the ability to make significant improvements in  function in a reasonable and predictable amount of time.  Equipment Recommendations  BSC/3in1;Tub/shower bench;Wheelchair (measurements OT);Wheelchair cushion (measurements OT);Other (comment) (RW)    Recommendations for Other Services       Precautions / Restrictions Precautions Precautions: Fall Precaution Comments: Reports multiple episodes of lowering himself to the ground or falling due to weakness Restrictions Weight Bearing Restrictions: No      Mobility Bed Mobility Overal bed mobility: Needs Assistance Bed Mobility: Supine to Sit, Sit to Supine     Supine to sit: Min assist, HOB elevated Sit to supine: Mod assist   General bed mobility comments: Min A to lift trunk to sitting, mod A to bring BLE back into    Transfers Overall transfer level: Needs assistance Equipment used: 1 person hand held assist Transfers: Sit to/from Stand, Bed to chair/wheelchair/BSC Sit to Stand: Mod assist     Step pivot transfers: Min assist, Mod assist     General transfer comment: Pt limited by weakness an shuffled gait, having difficulty progressing steps.      Balance Overall balance assessment: History of Falls, Needs assistance Sitting-balance support: Bilateral upper extremity supported, Feet supported Sitting balance-Leahy Scale: Fair     Standing balance support: Single extremity supported Standing balance-Leahy Scale: Poor Standing balance comment: heavily reliant on external support                           ADL either performed or assessed with clinical judgement   ADL Overall ADL's : Needs assistance/impaired Eating/Feeding: Set up;Sitting   Grooming: Set up;Sitting   Upper Body Bathing: Set up;Sitting   Lower Body Bathing: Moderate assistance;Sitting/lateral leans;Sit to/from stand   Upper Body Dressing : Set up;Sitting   Lower Body Dressing: Moderate assistance;Sitting/lateral  leans;Sit to/from stand   Toilet Transfer: Moderate  assistance;Stand-pivot   Toileting- Clothing Manipulation and Hygiene: Moderate assistance;Sitting/lateral lean;Sit to/from stand       Functional mobility during ADLs: Moderate assistance General ADL Comments: Pt limited by weakness, balance deficits, and a shuffled gait     Vision Baseline Vision/History: 1 Wears glasses Ability to See in Adequate Light: 0 Adequate Patient Visual Report: No change from baseline Vision Assessment?: No apparent visual deficits     Perception     Praxis Praxis Praxis-Other Comments: Pt with difficulty ambulating and sustaining movement, demonstrates 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    Pertinent Vitals/Pain Pain Assessment Pain Assessment: No/denies pain     Hand Dominance Left   Extremity/Trunk Assessment Upper Extremity Assessment Upper Extremity Assessment: Generalized weakness   Lower Extremity Assessment Lower Extremity Assessment: Defer to PT evaluation   Cervical / Trunk Assessment Cervical / Trunk Assessment: Kyphotic   Communication Communication Communication: No difficulties   Cognition Arousal/Alertness: Awake/alert Behavior During Therapy: Impulsive Overall Cognitive Status: No family/caregiver present to determine baseline cognitive functioning                                 General Comments: Decreased safety awareness, as well as defict awareness. Very quirky and inappropriate at times. Most likely near his baseline.     General Comments  VSS on Ra    Exercises     Shoulder Instructions      Home Living Family/patient expects to be discharged to:: Private residence Living Arrangements: Alone Available Help at Discharge: Friend(s);Available PRN/intermittently Type of Home: Apartment Home Access: Level entry     Home Layout: One level     Bathroom Shower/Tub: Chief Strategy Officer: Standard     Home Equipment: None          Prior Functioning/Environment Prior Level of  Function : Independent/Modified Independent             Mobility Comments: reports furniture walking at home; walks to/from apartment complex office ADLs Comments: Patient reports taking 45 minutes to get dressed if he is leaving the house, but reports he is independent        OT Problem List: Decreased strength;Decreased activity tolerance;Impaired balance (sitting and/or standing);Decreased safety awareness;Decreased cognition;Decreased knowledge of use of DME or AE;Impaired sensation;Pain      OT Treatment/Interventions: Self-care/ADL training;Therapeutic exercise;Energy conservation;DME and/or AE instruction;Therapeutic activities;Patient/family education;Balance training    OT Goals(Current goals can be found in the care plan section) Acute Rehab OT Goals Patient Stated Goal: To be able to get around his apartment and take care of himsef OT Goal Formulation: With patient Time For Goal Achievement: 10/26/21 Potential to Achieve Goals: Good ADL Goals Pt Will Perform Lower Body Bathing: with modified independence;sitting/lateral leans;sit to/from stand Pt Will Perform Lower Body Dressing: with modified independence;sitting/lateral leans;sit to/from stand Pt Will Transfer to Toilet: with modified independence;stand pivot transfer Pt Will Perform Toileting - Clothing Manipulation and hygiene: with modified independence;sitting/lateral leans;sit to/from stand Additional ADL Goal #1: Pt will report 3 fall prevention techniques that he can use at home.  OT Frequency: Min 2X/week    Co-evaluation              AM-PAC OT "6 Clicks" Daily Activity     Outcome Measure Help from another person eating meals?: A Little Help from another person taking care of personal grooming?: A Little Help from another person  toileting, which includes using toliet, bedpan, or urinal?: A Lot Help from another person bathing (including washing, rinsing, drying)?: A Lot Help from another person to put  on and taking off regular upper body clothing?: A Little Help from another person to put on and taking off regular lower body clothing?: A Lot 6 Click Score: 15   End of Session Nurse Communication: Mobility status  Activity Tolerance: Patient tolerated treatment well Patient left: in bed;with call bell/phone within reach  OT Visit Diagnosis: Unsteadiness on feet (R26.81);Other abnormalities of gait and mobility (R26.89);Muscle weakness (generalized) (M62.81)                Time: 1610-9604 OT Time Calculation (min): 24 min Charges:  OT General Charges $OT Visit: 1 Visit OT Evaluation $OT Eval Moderate Complexity: 1 Mod OT Treatments $Therapeutic Activity: 8-22 mins  Corrie Brannen H., OTR/L Acute Rehabilitation  [place]dex Garlitz Elane Maliyah Willets 10/12/2021, 11:15 AM

## 2021-10-12 NOTE — ED Notes (Signed)
Ptar cancelled, told to call back in morning around 8am to be put back on list ?

## 2021-10-12 NOTE — Progress Notes (Signed)
CSW met with John Shields at bedside. Per John Shields his housekey is defective and will not open his front door. CSW enquired as to why John Shields did not mention this earlier during the day, John Shields states he did not know he was being d/c'ed today. When CSW asked further questions about door John Shields states he wrote down issue and gave information to "someone at the desk". ?CSW called John Shields's friend Sunday Spillers who states she knows nothing about the situation and hs no copy of the key.  ? ?John Shields states he will be able to speak with his apartment complex in the morning for assistance opening door.  ? ?First shift CSW updated. ?

## 2021-10-12 NOTE — ED Notes (Signed)
PT sleeping

## 2021-10-12 NOTE — ED Notes (Signed)
Breakfast order placed ?

## 2021-10-12 NOTE — ED Notes (Signed)
Pt assisted into recliner by NT for meal. ?

## 2021-10-13 NOTE — ED Notes (Signed)
PTAR WAS CALLED

## 2021-10-13 NOTE — ED Notes (Signed)
Patient was given a Cup of Ginger Ale.

## 2021-11-08 ENCOUNTER — Other Ambulatory Visit: Payer: Self-pay

## 2021-11-08 ENCOUNTER — Encounter (HOSPITAL_COMMUNITY): Payer: Self-pay

## 2021-11-08 ENCOUNTER — Emergency Department (HOSPITAL_COMMUNITY): Payer: No Typology Code available for payment source

## 2021-11-08 ENCOUNTER — Emergency Department (HOSPITAL_COMMUNITY)
Admission: EM | Admit: 2021-11-08 | Discharge: 2021-11-09 | Disposition: A | Discharge status: Home / Self Care | Payer: No Typology Code available for payment source | Attending: Emergency Medicine | Admitting: Emergency Medicine

## 2021-11-08 DIAGNOSIS — Z79899 Other long term (current) drug therapy: Secondary | ICD-10-CM | POA: Insufficient documentation

## 2021-11-08 DIAGNOSIS — R531 Weakness: Secondary | ICD-10-CM | POA: Insufficient documentation

## 2021-11-08 DIAGNOSIS — R339 Retention of urine, unspecified: Secondary | ICD-10-CM | POA: Diagnosis not present

## 2021-11-08 DIAGNOSIS — R338 Other retention of urine: Secondary | ICD-10-CM

## 2021-11-08 DIAGNOSIS — R1030 Lower abdominal pain, unspecified: Secondary | ICD-10-CM | POA: Diagnosis not present

## 2021-11-08 DIAGNOSIS — Z7982 Long term (current) use of aspirin: Secondary | ICD-10-CM | POA: Insufficient documentation

## 2021-11-08 DIAGNOSIS — Z7901 Long term (current) use of anticoagulants: Secondary | ICD-10-CM | POA: Diagnosis not present

## 2021-11-08 LAB — URINALYSIS, ROUTINE W REFLEX MICROSCOPIC
Bilirubin Urine: NEGATIVE
Glucose, UA: NEGATIVE mg/dL
Ketones, ur: NEGATIVE mg/dL
Leukocytes,Ua: NEGATIVE
Nitrite: NEGATIVE
Protein, ur: 100 mg/dL — AB
Specific Gravity, Urine: 1.019 (ref 1.005–1.030)
pH: 5 (ref 5.0–8.0)

## 2021-11-08 LAB — CBC WITH DIFFERENTIAL/PLATELET
Abs Immature Granulocytes: 0.08 10*3/uL — ABNORMAL HIGH (ref 0.00–0.07)
Basophils Absolute: 0 10*3/uL (ref 0.0–0.1)
Basophils Relative: 0 %
Eosinophils Absolute: 0 10*3/uL (ref 0.0–0.5)
Eosinophils Relative: 0 %
HCT: 40.4 % (ref 39.0–52.0)
Hemoglobin: 12.6 g/dL — ABNORMAL LOW (ref 13.0–17.0)
Immature Granulocytes: 1 %
Lymphocytes Relative: 22 %
Lymphs Abs: 1.7 10*3/uL (ref 0.7–4.0)
MCH: 22.1 pg — ABNORMAL LOW (ref 26.0–34.0)
MCHC: 31.2 g/dL (ref 30.0–36.0)
MCV: 71 fL — ABNORMAL LOW (ref 80.0–100.0)
Monocytes Absolute: 0.8 10*3/uL (ref 0.1–1.0)
Monocytes Relative: 10 %
Neutro Abs: 5.2 10*3/uL (ref 1.7–7.7)
Neutrophils Relative %: 67 %
Platelets: 135 10*3/uL — ABNORMAL LOW (ref 150–400)
RBC: 5.69 MIL/uL (ref 4.22–5.81)
RDW: 14.6 % (ref 11.5–15.5)
WBC: 7.8 10*3/uL (ref 4.0–10.5)
nRBC: 0 % (ref 0.0–0.2)

## 2021-11-08 LAB — COMPREHENSIVE METABOLIC PANEL
ALT: 19 U/L (ref 0–44)
AST: 65 U/L — ABNORMAL HIGH (ref 15–41)
Albumin: 3.7 g/dL (ref 3.5–5.0)
Alkaline Phosphatase: 129 U/L — ABNORMAL HIGH (ref 38–126)
Anion gap: 12 (ref 5–15)
BUN: 20 mg/dL (ref 8–23)
CO2: 26 mmol/L (ref 22–32)
Calcium: 9.6 mg/dL (ref 8.9–10.3)
Chloride: 101 mmol/L (ref 98–111)
Creatinine, Ser: 1.07 mg/dL (ref 0.61–1.24)
GFR, Estimated: >60 mL/min (ref >60)
Glucose, Bld: 116 mg/dL — ABNORMAL HIGH (ref 70–99)
Potassium: 3.8 mmol/L (ref 3.5–5.1)
Sodium: 139 mmol/L (ref 135–145)
Total Bilirubin: 0.5 mg/dL (ref 0.3–1.2)
Total Protein: 7.9 g/dL (ref 6.5–8.1)

## 2021-11-08 LAB — TROPONIN I (HIGH SENSITIVITY): Troponin I (High Sensitivity): 13 ng/L (ref <18)

## 2021-11-08 LAB — CK: Total CK: 800 U/L — ABNORMAL HIGH (ref 49–397)

## 2021-11-08 MED ORDER — TAMSULOSIN HCL 0.4 MG PO CAPS: 0.4000 mg | ORAL_CAPSULE | Freq: Every day | ORAL | 0 refills | Status: DC

## 2021-11-08 MED ORDER — LACTATED RINGERS IV BOLUS
1000.0000 mL | Freq: Once | INTRAVENOUS | Status: AC
  Administered 2021-11-08: 1000 mL via INTRAVENOUS

## 2021-11-08 NOTE — ED Provider Notes (Signed)
[facility] DEPT Provider Note   CSN: [id number] Arrival date & time: 11/08/21  1820     History  Chief Complaint  Patient presents with   Weakness    John Shields is a 76 y.o. male.  HPI 76 year old male presents with generalized weakness.  Patient is a poor historian.  He states he has been feeling weak for about a day or so.  He states he fell off the couch and could not get up off the ground so EMS had to pick him up.  Denies focal weakness.  He states that he is also had difficulty urinating and it hurts when he urinates.  Denies chest pain. Did not hit his head.  Home Medications Prior to Admission medications   Medication Sig Start Date End Date Taking? Authorizing Provider  amLODipine (NORVASC) 2.5 MG tablet Take 2.5 mg by mouth daily. Patient not taking: Reported on 10/08/2021 06/28/21   [provider]  apixaban (ELIQUIS) 5 MG TABS tablet Take 1 tablet (5 mg total) by mouth 2 (two) times daily. Patient not taking: Reported on 10/08/2021 04/07/19   Nita Sells, MD  aspirin 325 MG EC tablet Take 650 mg by mouth daily.    [provider]  atorvastatin (LIPITOR) 40 MG tablet Take 40 mg by mouth daily. Patient not taking: Reported on 10/08/2021 07/13/21   [provider]  hydrALAZINE (APRESOLINE) 25 MG tablet Take by mouth daily. Patient not taking: Reported on 10/08/2021 06/27/21   [provider]  hydrochlorothiazide (HYDRODIURIL) 25 MG tablet Take 25 mg by mouth daily. Patient not taking: Reported on 10/08/2021 06/27/21   [provider]  metoprolol tartrate (LOPRESSOR) 25 MG tablet Take 25 mg by mouth daily. Patient not taking: Reported on 10/08/2021    [provider]  Multiple Vitamins-Minerals (CENTRUM SILVER 50+MEN) TABS Take 1 tablet by mouth daily.    [provider]  tamsulosin (FLOMAX) 0.4 MG CAPS capsule Take 1 capsule (0.4 mg total) by mouth daily. 11/08/21   Sherwood Gambler, MD      Allergies    Patient has no known allergies.    Review of Systems   Review of Systems  Constitutional:  Negative for fever.  Cardiovascular:  Negative for chest pain.  Gastrointestinal:  Negative for abdominal pain.  Genitourinary:  Positive for difficulty urinating and dysuria.  Neurological:  Positive for weakness.    Physical Exam Updated Vital Signs BP (!) 161/85   Pulse 89   Temp 98 F (36.7 C)   Resp (!) 21   Ht 6' (1.829 m)   Wt 106 kg   SpO2 96%   BMI 31.69 kg/m  Physical Exam Vitals and nursing note reviewed.  Constitutional:      Appearance: He is well-developed.  HENT:     Head: Normocephalic and atraumatic.  Cardiovascular:     Rate and Rhythm: Normal rate and regular rhythm.     Heart sounds: Normal heart sounds.  Pulmonary:     Effort: Pulmonary effort is normal.     Breath sounds: Normal breath sounds.  Abdominal:     General: There is distension (suprapubic).     Palpations: Abdomen is soft.     Tenderness: There is abdominal tenderness (suprapubic).  Skin:    General: Skin is warm and dry.  Neurological:     Mental Status: He is alert and oriented to person, place, and time.     Comments: 5/5 strength in BUE. Mild weakness to  BLE.     ED Results / Procedures / Treatments   Labs (all labs ordered are listed, but only abnormal results are displayed) Labs Reviewed  COMPREHENSIVE METABOLIC PANEL - Abnormal; Notable for the following components:      Result Value   Glucose, Bld 116 (*)    AST 65 (*)    Alkaline Phosphatase 129 (*)    All other components within normal limits  CBC WITH DIFFERENTIAL/PLATELET - Abnormal; Notable for the following components:   Hemoglobin 12.6 (*)    MCV 71.0 (*)    MCH 22.1 (*)    Platelets 135 (*)    Abs Immature Granulocytes 0.08 (*)    All other components within normal limits  URINALYSIS, ROUTINE W REFLEX MICROSCOPIC - Abnormal; Notable for the following components:   APPearance CLOUDY  (*)    Hgb urine dipstick SMALL (*)    Protein, ur 100 (*)    Bacteria, UA FEW (*)    All other components within normal limits  CK - Abnormal; Notable for the following components:   Total CK 800 (*)    All other components within normal limits  TROPONIN I (HIGH SENSITIVITY)    EKG EKG Interpretation  Date/Time:  Tuesday November 08 2021 19:26:49 EDT Ventricular Rate:  80 PR Interval:  195 QRS Duration: 158 QT Interval:  374 QTC Calculation: 432 R Axis:   -71 Text Interpretation: Sinus rhythm RBBB and LAFB Left ventricular hypertrophy similar to Mar 2023 Confirmed by Sherwood Gambler [phone]) on 11/08/2021 8:08:57 PM  Radiology DG Chest Portable 1 View  Result Date: 11/08/2021 CLINICAL DATA:  Generalized weakness EXAM: PORTABLE CHEST 1 VIEW COMPARISON:  06/09/2021 FINDINGS: Low lung volumes. Borderline to mild cardiomegaly. Both lungs are clear. The visualized skeletal structures are unremarkable. IMPRESSION: No active disease. Electronically Signed   By: Donavan Foil M.D.   On: 11/08/2021 20:42    Procedures Procedures    Medications Ordered in ED Medications  lactated ringers bolus 1,000 mL (0 mLs Intravenous Stopped 11/08/21 2222)    ED Course/ Medical Decision Making/ A&P                           Medical Decision Making Amount and/or Complexity of Data Reviewed External Data Reviewed: notes. Labs: ordered. Radiology: ordered and independent interpretation performed. ECG/medicine tests: ordered and independent interpretation performed.  Risk Prescription drug management.   Patient presents with recurrent generalized weakness.  Chart reviewed and he has been in and out of the hospital, both admitted as well as boarding in the emergency department for similar issues.  He has been given a wheelchair and walker.  Sounds like he was generally weak and rolled onto the floor but did not hit his head or injure himself.  CK is mildly elevated but after some fluids and with no  renal dysfunction I doubt that he has significant rhabdomyolysis.  Small amount of blood in the urine but there are RBCs so I do not think this is consistent with myoglobinuria.  He was also noted to have urinary retention and a Foley catheter was placed with good response.  Seems like he is mostly noncompliant with his meds, including Flomax.  Troponin was sent and is negative.  He tells me that he is actually been weak for over a week and so I do not think MI is likely with a negative troponin and there are no focal deficits.  This seems to be  a recurrent issue for him.  At this point, there is no indication for admission and I think is reasonable to discharge him back home as he has resources.  Given return precautions.        Final Clinical Impression(s) / ED Diagnoses Final diagnoses:  Generalized weakness  Acute urinary retention    Rx / DC Orders ED Discharge Orders          Ordered    tamsulosin (FLOMAX) 0.4 MG CAPS capsule  Daily        11/08/21 2209              Sherwood Gambler, MD 11/08/21 2241

## 2021-11-08 NOTE — ED Triage Notes (Signed)
BIBA from home c/o weakness and decreased urine output. Non-compliment with meds per ems.  Just discharged from [facility]

## 2021-11-09 ENCOUNTER — Encounter (HOSPITAL_COMMUNITY): Payer: Self-pay | Admitting: Emergency Medicine

## 2021-11-09 ENCOUNTER — Inpatient Hospital Stay (HOSPITAL_COMMUNITY)
Admission: EM | Admit: 2021-11-09 | Discharge: 2021-11-18 | DRG: 689 | Disposition: A | Discharge status: Skilled Nursing Facility | Payer: Medicare Other | Attending: Internal Medicine | Admitting: Internal Medicine

## 2021-11-09 ENCOUNTER — Emergency Department (HOSPITAL_COMMUNITY): Payer: Medicare Other

## 2021-11-09 ENCOUNTER — Other Ambulatory Visit: Payer: Self-pay

## 2021-11-09 DIAGNOSIS — Z79899 Other long term (current) drug therapy: Secondary | ICD-10-CM

## 2021-11-09 DIAGNOSIS — R531 Weakness: Secondary | ICD-10-CM

## 2021-11-09 DIAGNOSIS — I2510 Atherosclerotic heart disease of native coronary artery without angina pectoris: Secondary | ICD-10-CM | POA: Diagnosis present

## 2021-11-09 DIAGNOSIS — I447 Left bundle-branch block, unspecified: Secondary | ICD-10-CM | POA: Diagnosis present

## 2021-11-09 DIAGNOSIS — Z8249 Family history of ischemic heart disease and other diseases of the circulatory system: Secondary | ICD-10-CM

## 2021-11-09 DIAGNOSIS — Z6831 Body mass index (BMI) 31.0-31.9, adult: Secondary | ICD-10-CM

## 2021-11-09 DIAGNOSIS — Z8546 Personal history of malignant neoplasm of prostate: Secondary | ICD-10-CM

## 2021-11-09 DIAGNOSIS — E119 Type 2 diabetes mellitus without complications: Secondary | ICD-10-CM | POA: Diagnosis present

## 2021-11-09 DIAGNOSIS — R627 Adult failure to thrive: Secondary | ICD-10-CM | POA: Diagnosis present

## 2021-11-09 DIAGNOSIS — Z87440 Personal history of urinary (tract) infections: Secondary | ICD-10-CM

## 2021-11-09 DIAGNOSIS — D696 Thrombocytopenia, unspecified: Secondary | ICD-10-CM | POA: Diagnosis present

## 2021-11-09 DIAGNOSIS — W19XXXA Unspecified fall, initial encounter: Secondary | ICD-10-CM | POA: Diagnosis present

## 2021-11-09 DIAGNOSIS — R748 Abnormal levels of other serum enzymes: Secondary | ICD-10-CM

## 2021-11-09 DIAGNOSIS — B964 Proteus (mirabilis) (morganii) as the cause of diseases classified elsewhere: Secondary | ICD-10-CM | POA: Diagnosis present

## 2021-11-09 DIAGNOSIS — Z85528 Personal history of other malignant neoplasm of kidney: Secondary | ICD-10-CM

## 2021-11-09 DIAGNOSIS — E785 Hyperlipidemia, unspecified: Secondary | ICD-10-CM | POA: Diagnosis present

## 2021-11-09 DIAGNOSIS — R258 Other abnormal involuntary movements: Secondary | ICD-10-CM

## 2021-11-09 DIAGNOSIS — I10 Essential (primary) hypertension: Secondary | ICD-10-CM | POA: Diagnosis present

## 2021-11-09 DIAGNOSIS — E669 Obesity, unspecified: Secondary | ICD-10-CM | POA: Diagnosis present

## 2021-11-09 DIAGNOSIS — M6282 Rhabdomyolysis: Secondary | ICD-10-CM

## 2021-11-09 DIAGNOSIS — Z7901 Long term (current) use of anticoagulants: Secondary | ICD-10-CM

## 2021-11-09 DIAGNOSIS — N390 Urinary tract infection, site not specified: Principal | ICD-10-CM | POA: Diagnosis present

## 2021-11-09 DIAGNOSIS — I252 Old myocardial infarction: Secondary | ICD-10-CM

## 2021-11-09 DIAGNOSIS — I69398 Other sequelae of cerebral infarction: Secondary | ICD-10-CM

## 2021-11-09 DIAGNOSIS — Z7982 Long term (current) use of aspirin: Secondary | ICD-10-CM

## 2021-11-09 DIAGNOSIS — T796XXA Traumatic ischemia of muscle, initial encounter: Secondary | ICD-10-CM | POA: Diagnosis present

## 2021-11-09 DIAGNOSIS — R0602 Shortness of breath: Principal | ICD-10-CM

## 2021-11-09 DIAGNOSIS — I69354 Hemiplegia and hemiparesis following cerebral infarction affecting left non-dominant side: Secondary | ICD-10-CM

## 2021-11-09 DIAGNOSIS — F01C0 Vascular dementia, severe, without behavioral disturbance, psychotic disturbance, mood disturbance, and anxiety: Secondary | ICD-10-CM | POA: Diagnosis present

## 2021-11-09 DIAGNOSIS — Z86711 Personal history of pulmonary embolism: Secondary | ICD-10-CM

## 2021-11-09 DIAGNOSIS — G9341 Metabolic encephalopathy: Secondary | ICD-10-CM | POA: Diagnosis present

## 2021-11-09 DIAGNOSIS — Z9079 Acquired absence of other genital organ(s): Secondary | ICD-10-CM

## 2021-11-09 DIAGNOSIS — Z7984 Long term (current) use of oral hypoglycemic drugs: Secondary | ICD-10-CM

## 2021-11-09 LAB — BASIC METABOLIC PANEL
Anion gap: 13 (ref 5–15)
BUN: 19 mg/dL (ref 8–23)
CO2: 24 mmol/L (ref 22–32)
Calcium: 9.8 mg/dL (ref 8.9–10.3)
Chloride: 102 mmol/L (ref 98–111)
Creatinine, Ser: 1.05 mg/dL (ref 0.61–1.24)
GFR, Estimated: >60 mL/min (ref >60)
Glucose, Bld: 155 mg/dL — ABNORMAL HIGH (ref 70–99)
Potassium: 3.7 mmol/L (ref 3.5–5.1)
Sodium: 139 mmol/L (ref 135–145)

## 2021-11-09 LAB — CBC WITH DIFFERENTIAL/PLATELET
Abs Immature Granulocytes: 0.08 10*3/uL — ABNORMAL HIGH (ref 0.00–0.07)
Basophils Absolute: 0 10*3/uL (ref 0.0–0.1)
Basophils Relative: 0 %
Eosinophils Absolute: 0.1 10*3/uL (ref 0.0–0.5)
Eosinophils Relative: 1 %
HCT: 41.8 % (ref 39.0–52.0)
Hemoglobin: 13.1 g/dL (ref 13.0–17.0)
Immature Granulocytes: 1 %
Lymphocytes Relative: 20 %
Lymphs Abs: 1.4 10*3/uL (ref 0.7–4.0)
MCH: 22.1 pg — ABNORMAL LOW (ref 26.0–34.0)
MCHC: 31.3 g/dL (ref 30.0–36.0)
MCV: 70.4 fL — ABNORMAL LOW (ref 80.0–100.0)
Monocytes Absolute: 0.6 10*3/uL (ref 0.1–1.0)
Monocytes Relative: 8 %
Neutro Abs: 4.8 10*3/uL (ref 1.7–7.7)
Neutrophils Relative %: 70 %
Platelets: 132 10*3/uL — ABNORMAL LOW (ref 150–400)
RBC: 5.94 MIL/uL — ABNORMAL HIGH (ref 4.22–5.81)
RDW: 14.3 % (ref 11.5–15.5)
WBC: 7 10*3/uL (ref 4.0–10.5)
nRBC: 0 % (ref 0.0–0.2)

## 2021-11-09 LAB — TROPONIN I (HIGH SENSITIVITY)
Troponin I (High Sensitivity): 12 ng/L (ref <18)
Troponin I (High Sensitivity): 11 ng/L (ref <18)

## 2021-11-09 LAB — CK: Total CK: 682 U/L — ABNORMAL HIGH (ref 49–397)

## 2021-11-09 MED ORDER — CENTRUM SILVER 50+MEN PO TABS: 1.0000 | ORAL_TABLET | Freq: Every day | ORAL | Status: DC

## 2021-11-09 MED ORDER — HYDRALAZINE HCL 25 MG PO TABS
25.0000 mg | ORAL_TABLET | Freq: Every day | ORAL | Status: DC
  Administered 2021-11-09 – 2021-11-18 (×10): 25 mg via ORAL
  Filled 2021-11-09 (×10): qty 1

## 2021-11-09 MED ORDER — ADULT MULTIVITAMIN W/MINERALS CH
1.0000 | ORAL_TABLET | Freq: Every day | ORAL | Status: DC
  Administered 2021-11-09 – 2021-11-18 (×10): 1 via ORAL
  Filled 2021-11-09 (×10): qty 1

## 2021-11-09 MED ORDER — ALBUTEROL SULFATE HFA 108 (90 BASE) MCG/ACT IN AERS: 2.0000 | INHALATION_SPRAY | RESPIRATORY_TRACT | Status: DC | PRN

## 2021-11-09 MED ORDER — ATORVASTATIN CALCIUM 40 MG PO TABS
40.0000 mg | ORAL_TABLET | Freq: Every day | ORAL | Status: DC
  Administered 2021-11-09 – 2021-11-15 (×7): 40 mg via ORAL
  Filled 2021-11-09 (×7): qty 1

## 2021-11-09 MED ORDER — ASPIRIN 325 MG PO TBEC
650.0000 mg | DELAYED_RELEASE_TABLET | Freq: Every day | ORAL | Status: DC
  Administered 2021-11-09 – 2021-11-18 (×9): 650 mg via ORAL
  Filled 2021-11-09 (×11): qty 2

## 2021-11-09 NOTE — Hospital Course (Signed)
76 y.o. male w/ pmh of HTN,BPH,T2DM,hx if unprovoked PE in 2001, HLD he was here last evening 614 EARLY AM LABS CK 800 other neg work up- discharged to lobby- he said he was trying to get up to take a cab home when he became more short of breath and weak and brought back to ed room and ck at 600s. Needs placement per PT- but has no inpatient qualifying diagnosis- APS involved- seems custodial- discussed with Maryann-. ? Mri head for work up of gen weakness- does not meet criteria for 3 midnight inpatient admission for placement.Marland Kitchen

## 2021-11-09 NOTE — ED Notes (Signed)
Patient currently has open case with APS for legal guardianship. Christopher from [facility] would like an update. [phone].

## 2021-11-09 NOTE — ED Notes (Signed)
Patient resting watching tv at this time. Given apple juice.

## 2021-11-09 NOTE — ED Provider Notes (Signed)
[facility] DEPT Provider Note   CSN: [id number] Arrival date & time: 11/09/21  0754     History  Chief Complaint  Patient presents with   Shortness of Breath    John Shields is a 76 y.o. male.  He was here last evening for general weakness.  Had labs and was discharged to waiting room.  He said he was trying to get up to take a cab home when he became more short of breath and weak.  He feels his voice is not as strong as it usually is.  He denies any chest pain cough nausea vomiting.  He said he has been eating and drinking well.  He had to sit back into the wheelchair due to his weakness.  There was no fall or injury.  No fevers or chills.  The history is provided by the patient.  Weakness Severity:  Moderate Onset quality:  Gradual Duration:  2 days Timing:  Intermittent Progression:  Unchanged Chronicity:  New Relieved by:  Nothing Worsened by:  Activity Ineffective treatments:  None tried Associated symptoms: difficulty walking and shortness of breath   Associated symptoms: no abdominal pain, no chest pain, no cough, no diarrhea, no fever, no loss of consciousness, no vision change and no vomiting   Risk factors: neurologic disease        Home Medications Prior to Admission medications   Medication Sig Start Date End Date Taking? Authorizing Provider  amLODipine (NORVASC) 2.5 MG tablet Take 2.5 mg by mouth daily. Patient not taking: Reported on 10/08/2021 06/28/21   [provider]  apixaban (ELIQUIS) 5 MG TABS tablet Take 1 tablet (5 mg total) by mouth 2 (two) times daily. Patient not taking: Reported on 10/08/2021 04/07/19   Nita Sells, MD  aspirin 325 MG EC tablet Take 650 mg by mouth daily.    [provider]  atorvastatin (LIPITOR) 40 MG tablet Take 40 mg by mouth daily. Patient not taking: Reported on 10/08/2021 07/13/21   [provider]  hydrALAZINE (APRESOLINE) 25 MG tablet Take by mouth  daily. Patient not taking: Reported on 10/08/2021 06/27/21   [provider]  hydrochlorothiazide (HYDRODIURIL) 25 MG tablet Take 25 mg by mouth daily. Patient not taking: Reported on 10/08/2021 06/27/21   [provider]  metoprolol tartrate (LOPRESSOR) 25 MG tablet Take 25 mg by mouth daily. Patient not taking: Reported on 10/08/2021    [provider]  Multiple Vitamins-Minerals (CENTRUM SILVER 50+MEN) TABS Take 1 tablet by mouth daily.    [provider]  tamsulosin (FLOMAX) 0.4 MG CAPS capsule Take 1 capsule (0.4 mg total) by mouth daily. 11/08/21   Sherwood Gambler, MD      Allergies    Patient has no known allergies.    Review of Systems   Review of Systems  Constitutional:  Negative for fever.  Respiratory:  Positive for shortness of breath. Negative for cough.   Cardiovascular:  Negative for chest pain.  Gastrointestinal:  Negative for abdominal pain, diarrhea and vomiting.  Neurological:  Positive for weakness. Negative for loss of consciousness.    Physical Exam Updated Vital Signs BP (!) 130/93   Pulse (!) 116   Temp 98 F (36.7 C) (Oral)   Resp 20   SpO2 97%  Physical Exam Vitals and nursing note reviewed.  Constitutional:      General: He is not in acute distress.    Appearance: He is well-developed.  HENT:     Head: Normocephalic  and atraumatic.  Eyes:     Conjunctiva/sclera: Conjunctivae normal.  Cardiovascular:     Rate and Rhythm: Normal rate and regular rhythm.     Heart sounds: No murmur heard. Pulmonary:     Effort: Pulmonary effort is normal. No respiratory distress.     Breath sounds: Normal breath sounds.  Abdominal:     Palpations: Abdomen is soft.     Tenderness: There is no abdominal tenderness.  Musculoskeletal:        General: No swelling. Normal range of motion.     Cervical back: Neck supple.     Right lower leg: No tenderness.     Left lower leg: No tenderness.  Skin:    General: Skin is warm and dry.      Capillary Refill: Capillary refill takes less than 2 seconds.  Neurological:     General: No focal deficit present.     Mental Status: He is alert.     ED Results / Procedures / Treatments   Labs (all labs ordered are listed, but only abnormal results are displayed) Labs Reviewed  BASIC METABOLIC PANEL - Abnormal; Notable for the following components:      Result Value   Glucose, Bld 155 (*)    All other components within normal limits  CBC WITH DIFFERENTIAL/PLATELET - Abnormal; Notable for the following components:   RBC 5.94 (*)    MCV 70.4 (*)    MCH 22.1 (*)    Platelets 132 (*)    Abs Immature Granulocytes 0.08 (*)    All other components within normal limits  CK - Abnormal; Notable for the following components:   Total CK 682 (*)    All other components within normal limits  TROPONIN I (HIGH SENSITIVITY)  TROPONIN I (HIGH SENSITIVITY)    EKG EKG Interpretation  Date/Time:  Wednesday November 09 2021 08:04:58 EDT Ventricular Rate:  110 PR Interval:  163 QRS Duration: 142 QT Interval:  362 QTC Calculation: 490 R Axis:   -78 Text Interpretation: Sinus tachycardia RBBB and LAFB Left ventricular hypertrophy ST elevation inferior, more pronounced than prior yesterday Confirmed by Aletta Edouard [phone]) on 11/09/2021 8:12:43 AM  Radiology DG Chest Portable 1 View  Result Date: 11/08/2021 CLINICAL DATA:  Generalized weakness EXAM: PORTABLE CHEST 1 VIEW COMPARISON:  06/09/2021 FINDINGS: Low lung volumes. Borderline to mild cardiomegaly. Both lungs are clear. The visualized skeletal structures are unremarkable. IMPRESSION: No active disease. Electronically Signed   By: Donavan Foil M.D.   On: 11/08/2021 20:42    Procedures Procedures    Medications Ordered in ED Medications  albuterol (VENTOLIN HFA) 108 (90 Base) MCG/ACT inhaler 2 puff (has no administration in time range)    ED Course/ Medical Decision Making/ A&P Clinical Course as of 11/09/21 1629  Wed Nov 09, 2021  0902 Chest x-ray interpreted by me as no acute findings.  Awaiting radiology reading. [MB]  38 Was informed by nurse that APS is involved with patient.  Apparently his house is a mess and there is some concern that he is unable to care for self.  We will place social work consult. [MB]  4492 Patient was seen by social work.  She is unable to place him from the ED.  Her recommendation is discharged to home via cab.  It sounds like they are pursuing guardianship for him. [MB]    Clinical Course User Index [MB] Hayden Rasmussen, MD  Medical Decision Making Amount and/or Complexity of Data Reviewed Labs: ordered. Radiology: ordered.  Risk OTC drugs. Prescription drug management.  This patient complains of shortness of breath weakness failure to thrive; this involves an extensive number of treatment Options and is a complaint that carries with it a high risk of complications and morbidity. The differential includes shortness of breath, pneumonia, pneumothorax, ACS, dehydration, failure to thrive  I ordered, reviewed and interpreted labs, which included CBC with normal white count normal hemoglobin, chemistries normal other than elevated glucose, troponins flat, CK better than prior I ordered medication Home medications and reviewed PMP when indicated. I ordered imaging studies which included chest x-ray and I independently    visualized and interpreted imaging which showed no acute findings Previous records obtained and reviewed including recent ED visit I consulted transitions of care and physical therapy and discussed lab and imaging findings and discussed disposition.  Cardiac monitoring reviewed, normal sinus rhythm Social determinants considered, patient has transportation issues, poor physical activity, isolation, food insecurity Critical Interventions: None  After the interventions stated above, I reevaluated the patient and found patient to be  hemodynamically stable Admission and further testing considered, patient would benefit from further services and possible placement.  Social work continue to work on these issues.  Currently awaiting transport back home.          Final Clinical Impression(s) / ED Diagnoses Final diagnoses:  SOB (shortness of breath)  Failure to thrive in adult    Rx / DC Orders ED Discharge Orders     None         Hayden Rasmussen, MD 11/09/21 [phone]

## 2021-11-09 NOTE — ED Notes (Signed)
NT and I gave patient a bath. He could hardly move from side to side. He is total care.

## 2021-11-09 NOTE — Discharge Instructions (Addendum)
Please return to the emergency department if any worsening or concerning symptoms.

## 2021-11-09 NOTE — ED Notes (Signed)
Patient transported to CT 

## 2021-11-09 NOTE — Progress Notes (Signed)
.  Transition of Care [facility]) - Emergency Department Mini Assessment   Patient Details  Name: John Shields MRN: [id number] Date of Birth: 09-26-1945  [facility]) CM/SW Contact:    Illene Regulus, LCSW Phone Number: 11/09/2021, 1:42 PM   Clinical Narrative:  CSW was consulted in regard to SNF placement. Pt visited ED on 6/13 and d/c, pt then checked back in on 6/14 due to shortness of breath. CSW received a VM from pt's APS worker [name], who stated they are working with a friend of the pt to take guardianship. APS worker is requesting SNF placement. Pt would need a 3-night qualifying inpatient stay, to qualify for SNF placement. Per [employer], DSS reported pt is over the income limit to receive Medicaid. pt would need to pay out of pocket for placement.   CSW spoke with pt's APS worker [name] [phone]) to inform him about pt's barriers to placement. Pt's APS worker stated pt's does not have the money to pay out of pocket for placement. Pt's APS worker stated pt's friend is unable to assist as well. Pt's APS worker will need to work with pt on his finances to spend down so pt can qualify for LTC Medicaid to receive LTC placement. Pt received DME on last visit to [facility] 10/12/21 , pt received wheelchair and walker. CSW informed pt's APS worker pt will d/c back to his apartment today. Pt can receive taxi voucher upon d/c.    ED Mini Assessment: What brought you to the Emergency Department? : Shortness of Breath  Barriers to Discharge: Inadequate or no insurance             Patient Contact and Communications        ,                 Admission diagnosis:  heavy breathing Patient Active Problem List   Diagnosis Date Noted   Rhabdomyolysis 06/10/2021   Dehydration 06/10/2021   Generalized weakness 06/10/2021   Acute prerenal azotemia 06/10/2021   Acute pulmonary embolism ([place]) 04/05/2019   Gross hematuria 04/05/2019    Diabetes mellitus type II, non insulin dependent ([place]) 04/05/2019   Leg hematoma, left, initial encounter 04/05/2019   Hematoma of left lower extremity    Dizziness 03/21/2019   History of CVA (cerebrovascular accident)    History of aneurysm    Dyslipidemia    Acute left-sided weakness 09/01/2017   Acute lower UTI 09/01/2017   Cerebral thrombosis with cerebral infarction 09/01/2017   Near syncope 07/25/2017   Prostate cancer, primary, 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 [facility])    Left bundle branch block (LBBB) on electrocardiogram    H/O bilateral orchiectomies    Dyspnea on exertion    BPH (benign prostatic hyperplasia) 12/27/2016   Renal mass 12/27/2016   Thrombocytopenia ([place]) 12/27/2016   Sepsis ([place]) 12/27/2016   Elevated LFTs    Lactic acidosis    AKI (acute kidney injury) ([place])    Urinary retention 06/07/2016   Constipation 06/07/2016   Acute renal failure (ARF) ([place]) 06/07/2016   Essential hypertension 06/07/2016   Hyperglycemia 06/07/2016   PCP:  Clinic, [place]:   [place], [place] - [address] [place] [address] Phone: [phone] Fax: [phone]  [facility] [address] [place] [address] Phone: [phone] Fax: [phone]

## 2021-11-09 NOTE — Evaluation (Signed)
Physical Therapy Evaluation Patient Details Name: John Shields MRN: [id number] DOB: [birth year] Today's Date: 11/09/2021  History of Present Illness  76 year old male with PMH significant for PE, metastatic prostate cancer, T2DM presenting to ED with weakness.  Pt with other recent ED visits for weakness and dizziness.  Clinical Impression  Pt seen in ED with above diagnosis.  Pt currently with functional limitations due to the deficits listed below (see PT Problem List). Pt will benefit from skilled PT to increase their independence and safety with mobility to allow discharge to the venue listed below.  Pt assisted with standing at edge of bed and requiring mod assist for standing.   Pt reports numerous falls.  Pt with recent ED visit and SNF recommended however pt unable to d/c to this venue.  Pt has RW and w/c at home however reports not using them and states they allow him to fall regardless.  Pt reports being weak and falling in parking lot resulting on being brought back into ED however makes no clear plan for therapy or equipment upon d/c, only concerned with no one feeding him here.        Recommendations for follow up therapy are one component of a multi-disciplinary discharge planning process, led by the attending physician.  Recommendations may be updated based on patient status, additional functional criteria and insurance authorization.  Follow Up Recommendations Skilled nursing-short term rehab (<3 hours/day)    Assistance Recommended at Discharge Intermittent Supervision/Assistance  Patient can return home with the following  A lot of help with walking and/or transfers;Assistance with cooking/housework    Equipment Recommendations None recommended by PT  Recommendations for Other Services       Functional Status Assessment Patient has had a recent decline in their functional status and demonstrates the ability to make significant improvements in function in a reasonable and  predictable amount of time.     Precautions / Restrictions Precautions Precautions: Fall Precaution Comments: Reports multiple episodes of lowering himself to the ground or falling due to weakness      Mobility  Bed Mobility Overal bed mobility: Needs Assistance Bed Mobility: Supine to Sit, Sit to Supine     Supine to sit: Mod assist Sit to supine: Max assist, +2 for physical assistance   General bed mobility comments: pt able to bring LEs over stretcher however struggling with trunk, assist for upper and lower body return to stretcher as pt reports weakness    Transfers Overall transfer level: Needs assistance Equipment used: Rolling walker (2 wheels) Transfers: Sit to/from Stand Sit to Stand: Mod assist, +2 safety/equipment           General transfer comment: pt with buckling LEs, cues for hand positioning and use of UE to assist through RW, assist to rise and stability; displayed difficulty taking steps however when asked to side step up [place], pt did not have as much difficulty with this    Ambulation/Gait               General Gait Details: pt felt unable  Stairs            Wheelchair Mobility    Modified Rankin (Stroke Patients Only)       Balance Overall balance assessment: History of Falls, Needs assistance         Standing balance support: Bilateral upper extremity supported, Reliant on assistive device for balance Standing balance-Leahy Scale: Poor Standing balance comment: heavily reliant on external support  Pertinent Vitals/Pain Pain Assessment Pain Assessment: No/denies pain    Home Living Family/patient expects to be discharged to:: Private residence Living Arrangements: Alone Available Help at Discharge: Friend(s);Available PRN/intermittently Type of Home: Apartment Home Access: Level entry       Home Layout: One level Home Equipment: Conservation officer, nature (2 wheels);Wheelchair -  manual Additional Comments: information from recent ED visit in May    Prior Function Prior Level of Function : Independent/Modified Independent             Mobility Comments: reports furniture walking at home; walks to/from apartment complex office ADLs Comments: Patient reports taking 45 minutes to get dressed if he is leaving the house, but reports he is independent     Hand Dominance        Extremity/Trunk Assessment   Upper Extremity Assessment Upper Extremity Assessment: Generalized weakness    Lower Extremity Assessment Lower Extremity Assessment: Generalized weakness       Communication   Communication: No difficulties  Cognition Arousal/Alertness: Awake/alert   Overall Cognitive Status: No family/caregiver present to determine baseline cognitive functioning                                 General Comments: decreased safety awareness, pt inappropriate at times -- appears this may be his baseline        General Comments      Exercises     Assessment/Plan    PT Assessment Patient needs continued PT services  PT Problem List Decreased strength;Decreased balance;Decreased mobility;Decreased knowledge of use of DME;Decreased safety awareness;Decreased activity tolerance       PT Treatment Interventions DME instruction;Gait training;Functional mobility training;Therapeutic activities;Therapeutic exercise;Balance training;Patient/family education    PT Goals (Current goals can be found in the Care Plan section)  Acute Rehab PT Goals PT Goal Formulation: With patient Time For Goal Achievement: 11/23/21 Potential to Achieve Goals: Fair    Frequency Min 2X/week     Co-evaluation               AM-PAC PT "6 Clicks" Mobility  Outcome Measure Help needed turning from your back to your side while in a flat bed without using bedrails?: A Lot Help needed moving from lying on your back to sitting on the side of a flat bed without using  bedrails?: A Lot Help needed moving to and from a bed to a chair (including a wheelchair)?: A Lot Help needed standing up from a chair using your arms (e.g., wheelchair or bedside chair)?: A Lot Help needed to walk in hospital room?: A Lot Help needed climbing 3-5 steps with a railing? : Total 6 Click Score: 11    End of Session Equipment Utilized During Treatment: Gait belt Activity Tolerance: Patient limited by fatigue Patient left:  (back on ED stretcher) Nurse Communication: Mobility status (present for transfer) PT Visit Diagnosis: History of falling (Z91.81);Muscle weakness (generalized) (M62.81);Difficulty in walking, not elsewhere classified (R26.2)    Time: 1345-1410 PT Time Calculation (min) (ACUTE ONLY): 25 min   Charges:   PT Evaluation $PT Eval Low Complexity: 1 Low PT Treatments $Therapeutic Activity: 8-22 mins       Jannette Spanner PT, DPT Acute Rehabilitation Services Pager: [phone] Office: [phone]   Myrtis Hopping [place] 11/09/2021, 2:33 PM

## 2021-11-09 NOTE — ED Notes (Signed)
Dinner tray given

## 2021-11-09 NOTE — ED Provider Notes (Signed)
Patient was seen earlier today by Dr. Melina Copa.  He initially was going to be discharged.  However he currently is unable to stand unassisted.  He requires a lot of support for ADLs.  He had a consult by physical therapy who recommended skilled nursing home placement.  However Medicare requires a 3-day hospitalization and patient is unable to finance skilled nursing facility without Medicare.  He does have some mild rhabdo although it seems to have improved since yesterday.  I did consult with the hospitalist to see if patient met criteria for admission and at this point he does not.  TOC continuing to follow to attempt to find a solution for placement.   Malvin Johns, MD 11/09/21 2215

## 2021-11-09 NOTE — Progress Notes (Signed)
CSW told provider that the Pt would have to stay the three nights in order to possible qualify for SNF.

## 2021-11-09 NOTE — ED Triage Notes (Signed)
Pt found in lobby. States he was waiting on a cab when he felt SHOB. Pt reports he has lost his voice as well. Pt was just d/c at 3am.

## 2021-11-10 ENCOUNTER — Emergency Department (HOSPITAL_COMMUNITY): Payer: Medicare Other

## 2021-11-10 MED ORDER — LORAZEPAM 2 MG/ML IJ SOLN: 0.5000 mg | Freq: Once | INTRAMUSCULAR | Status: DC | PRN

## 2021-11-10 NOTE — ED Provider Notes (Addendum)
Emergency Medicine Observation Re-evaluation Note  John Shields is a 76 y.o. male, seen on rounds today.  Pt initially presented to the ED for complaints of Shortness of Breath Currently, the patient is sleepgin.  Physical Exam  BP 131/78 (BP Location: Left Arm)   Pulse 77   Temp 98.3 F (36.8 C) (Oral)   Resp 14   Ht 1.829 m (6')   Wt 105.7 kg   SpO2 94%   BMI 31.60 kg/m  Physical Exam General: sleeping Cardiac: reg rate Lungs: breathing easily Psych: deferred  ED Course / MDM  EKG:EKG Interpretation  Date/Time:  Wednesday November 09 2021 08:04:58 EDT Ventricular Rate:  110 PR Interval:  163 QRS Duration: 142 QT Interval:  362 QTC Calculation: 490 R Axis:   -78 Text Interpretation: Sinus tachycardia RBBB and LAFB Left ventricular hypertrophy ST elevation inferior, more pronounced than prior yesterday Confirmed by Aletta Edouard [phone]) on 11/09/2021 8:12:43 AM  I have reviewed the labs performed to date as well as medications administered while in observation.  Recent changes in the last 24 hours include no changes.  Plan  Patient initially seen by Dr. Melina Copa and plan was for discharge.  Patient was too weak to ambulate.  Dr. Tamera Punt contacted hospitalist service regarding admission but patient did not meet inpatient criteria.  Patient is holding the ED for possible skilled nursing placement .  Tyrese Capriotti is not under involuntary commitment.  Staff requesting MRI to rule out occult stroke causing his weakness.  MRI ordered.     Dorie Rank, MD 11/10/21 0272    Dorie Rank, MD 11/10/21 [phone]

## 2021-11-10 NOTE — NC FL2 (Signed)
[place] LEVEL OF CARE SCREENING TOOL     IDENTIFICATION  Patient Name: John Shields Birthdate: 01-11-1946 Sex: male Admission Date (Current Location): 11/09/2021  [facility] and [place] Number:  Herbalist and Address:  [facility],  [address] [place], [place]      Provider Number: [id number]  Attending Physician Name and Address:  Default, Provider, MD  Relative Name and Phone Number:       Current Level of Care: Hospital Recommended Level of Care: [place] Prior Approval Number:    Date Approved/Denied:   PASRR Number: [id number]  Discharge Plan: SNF    Current Diagnoses: Patient Active Problem List   Diagnosis Date Noted   Rhabdomyolysis 06/10/2021   Dehydration 06/10/2021   Generalized weakness 06/10/2021   Acute prerenal azotemia 06/10/2021   Acute pulmonary embolism ([place]) 04/05/2019   Gross hematuria 04/05/2019   Diabetes mellitus type II, non insulin dependent ([place]) 04/05/2019   Leg hematoma, left, initial encounter 04/05/2019   Hematoma of left lower extremity    Dizziness 03/21/2019   History of CVA (cerebrovascular accident)    History of aneurysm    Dyslipidemia    Acute left-sided weakness 09/01/2017   Acute lower UTI 09/01/2017   Cerebral thrombosis with cerebral infarction 09/01/2017   Near syncope 07/25/2017   Prostate cancer, primary, 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 [facility])    Left bundle branch block (LBBB) on electrocardiogram    H/O bilateral orchiectomies    Dyspnea on exertion    BPH (benign prostatic hyperplasia) 12/27/2016   Renal mass 12/27/2016   Thrombocytopenia ([place]) 12/27/2016   Sepsis ([place]) 12/27/2016   Elevated LFTs    Lactic acidosis    AKI (acute kidney injury) ([place])    Urinary retention 06/07/2016   Constipation 06/07/2016   Acute renal failure (ARF) ([place]) 06/07/2016   Essential hypertension 06/07/2016   Hyperglycemia 06/07/2016    Orientation  RESPIRATION BLADDER Height & Weight     Self, Time, Situation, Place  Normal Continent Weight: 233 lb (105.7 kg) Height:  6' (182.9 cm)  BEHAVIORAL SYMPTOMS/MOOD NEUROLOGICAL BOWEL NUTRITION STATUS      Continent Diet (regular)  AMBULATORY STATUS COMMUNICATION OF NEEDS Skin   Extensive Assist Verbally Normal                       Personal Care Assistance Level of Assistance  Bathing, Feeding, Dressing Bathing Assistance: Limited assistance Feeding assistance: Independent Dressing Assistance: Limited assistance     Functional Limitations Info  Sight, Hearing, Speech Sight Info: Adequate Hearing Info: Adequate Speech Info: Adequate    SPECIAL CARE FACTORS FREQUENCY  PT (By licensed PT), OT (By licensed OT)     PT Frequency: 5x a week OT Frequency: 5x a week            Contractures Contractures Info: Not present    Additional Factors Info  Code Status, Allergies Code Status Info: full Allergies Info: no known allergies           Current Medications (11/10/2021):  This is the current hospital active medication list Current Facility-Administered Medications  Medication Dose Route Frequency Provider Last Rate Last Admin   albuterol (VENTOLIN HFA) 108 (90 Base) MCG/ACT inhaler 2 puff  2 puff Inhalation Q2H PRN Hayden Rasmussen, MD       aspirin EC tablet 650 mg  650 mg Oral Daily Hayden Rasmussen, MD   650 mg at  11/09/21 1448   atorvastatin (LIPITOR) tablet 40 mg  40 mg Oral Daily Hayden Rasmussen, MD   40 mg at 11/10/21 1102   hydrALAZINE (APRESOLINE) tablet 25 mg  25 mg Oral Daily Hayden Rasmussen, MD   25 mg at 11/10/21 1102   LORazepam (ATIVAN) injection 0.5 mg  0.5 mg Intravenous Once PRN Dorie Rank, MD       multivitamin with minerals tablet 1 tablet  1 tablet Oral Daily Minda Ditto, RPH   1 tablet at 11/10/21 1102   Current Outpatient Medications  Medication Sig Dispense Refill   aspirin 325 MG EC tablet Take 650 mg by mouth daily.     Multiple  Vitamins-Minerals (CENTRUM SILVER 50+MEN) TABS Take 1 tablet by mouth daily.     amLODipine (NORVASC) 2.5 MG tablet Take 2.5 mg by mouth daily. (Patient not taking: Reported on 10/08/2021)     atorvastatin (LIPITOR) 40 MG tablet Take 40 mg by mouth daily. (Patient not taking: Reported on 10/08/2021)     hydrALAZINE (APRESOLINE) 25 MG tablet Take by mouth daily. (Patient not taking: Reported on 10/08/2021)     hydrochlorothiazide (HYDRODIURIL) 25 MG tablet Take 25 mg by mouth daily. (Patient not taking: Reported on 10/08/2021)     metoprolol tartrate (LOPRESSOR) 25 MG tablet Take 25 mg by mouth daily. (Patient not taking: Reported on 10/08/2021)     tamsulosin (FLOMAX) 0.4 MG CAPS capsule Take 1 capsule (0.4 mg total) by mouth daily. (Patient not taking: Reported on 11/09/2021) 30 capsule 0     Discharge Medications: Please see discharge summary for a list of discharge medications.  Relevant Imaging Results:  Relevant Lab Results:   Additional Information SSN :[id number]  [place], LCSW

## 2021-11-11 NOTE — Evaluation (Signed)
Occupational Therapy Evaluation Patient Details Name: John Shields MRN: [id number] DOB: 1946/05/14 Today's Date: 11/11/2021   History of Present Illness 76 year old male with PMH significant for PE, metastatic prostate cancer, T2DM presenting to ED with weakness.  Pt with other recent ED visits for weakness and dizziness. No acute abnormalities on imaging or other testing.   Clinical Impression   [name] is a 76 year old man who presented to the hospital with reports of weakness and falls.  He reports he lives alone and has some help from a neighbor for grocery shopping - though from a prior TOC note patient had a "falling out" with that neighbor.  From chart reviews he has been in the ED multiple times for falls - January, March, May and now June. He went to [facility] in January. Prior OT evaluations in January and May noted concerns for patient having impaired cognition.   He presents today with cognitive deficits - difficulty with orientation questions, needing cues for sequencing, and difficulty with problem solving. He is alert to self and able to state the year. He is unable to come up with June though he knows father's day is in a couple of days. He struggled to state he was in the hospital stating it was an institution initially and then needed cue for Hospital name. He does not answer most questions with a direct answer. He masks his lack of answers with humor and some distraction. He demonstrates generalized weakness, decreased activity tolerance, impaired balance and abnormal gait. He needs mod assist to transfer to edge of bed and mod assist to power up with walker,and mod assist to ambulate with walker - exhibiting a festinating gait and needing cues for each step and to lengthen step length as well as managing walker safely. He needs mod - total assist for UB and LB ADLs. He exhibited difficulty getting on an off the toilet safely. At times he exhibits dysphonia and is hard  to hear. Patient will benefit from skilled OT services while in hospital to improve deficits and learn compensatory strategies as needed in order to improve safety and functional abilities.  Patient does not exhibit the physical abilities in order to return home and therapist recommends short term rehab or 24/7 assistance. Quite frankly therapist is concerned that he may not have the mental capacity to care for himself and would recommend a cognitive evaluation.     Recommendations for follow up therapy are one component of a multi-disciplinary discharge planning process, led by the attending physician.  Recommendations may be updated based on patient status, additional functional criteria and insurance authorization.   Follow Up Recommendations  Skilled nursing-short term rehab (<3 hours/day)    Assistance Recommended at Discharge Frequent or constant Supervision/Assistance  Patient can return home with the following Assistance with cooking/housework;Direct supervision/assist for medications management;Direct supervision/assist for financial management;Assist for transportation;Help with stairs or ramp for entrance;Two people to help with walking and/or transfers;Two people to help with bathing/dressing/bathroom    Functional Status Assessment  Patient has had a recent decline in their functional status and/or demonstrates limited ability to make significant improvements in function in a reasonable and predictable amount of time  Equipment Recommendations  Other (comment) (TBD)    Recommendations for Other Services       Precautions / Restrictions Precautions Precautions: Fall Precaution Comments: Reports multiple episodes of lowering himself to the ground or falling due to weakness Restrictions Weight Bearing Restrictions: No      Mobility  Bed Mobility Overal bed mobility: Needs Assistance Bed Mobility: Supine to Sit     Supine to sit: Mod assist, HOB elevated, +2 for  safety/equipment     General bed mobility comments: Mod assist for both trunk lift and scooting forward to edge of bed.    Transfers Overall transfer level: Needs assistance Equipment used: Rolling walker (2 wheels) Transfers: Sit to/from Stand Sit to Stand: Mod assist, +2 safety/equipment           General transfer comment: Mod assist to power up from bed and mod assist to power up from toilet. Needs mod assist (constant verbal cues to sequence and lengthen steps and for RW management). Exhibits a festinating gait and requires a multiture of steps and assistance to make turns.      Balance Overall balance assessment: Needs assistance Sitting-balance support: No upper extremity supported, Feet supported Sitting balance-Leahy Scale: Fair     Standing balance support: During functional activity Standing balance-Leahy Scale: Fair Standing balance comment: can stand without upper extremity support for ADl task but cannot tolerate challenge.                           ADL either performed or assessed with clinical judgement   ADL Overall ADL's : Needs assistance/impaired Eating/Feeding: Set up;Sitting   Grooming: Standing;Wash/dry hands;Set up;Min guard Grooming Details (indicate cue type and reason): min guard to stand at sink with fair balance to wash hands Upper Body Bathing: Moderate assistance;Sitting   Lower Body Bathing: Sit to/from stand;Maximal assistance;Sitting/lateral leans   Upper Body Dressing : Set up;Sitting;Cueing for sequencing   Lower Body Dressing: Total assistance;+2 for physical assistance;Sit to/from stand   Toilet Transfer: Moderate assistance;Stand-pivot;Grab bars;Regular Toilet;Rolling walker (2 wheels) Toilet Transfer Details (indicate cue type and reason): cues and tactiel assistance to transfer safely on to commode. mod assist to rise from tiolet with use of grab bar Toileting- Clothing Manipulation and Hygiene: Maximal assistance;Sit  to/from stand Toileting - Clothing Manipulation Details (indicate cue type and reason): Patient able to assist with hygiene - but also got BM on his hands - needing assistance for quality cleanliness.     Functional mobility during ADLs: Moderate assistance;+2 for safety/equipment       Vision   Vision Assessment?: No apparent visual deficits     Perception     Praxis      Pertinent Vitals/Pain Pain Assessment Pain Assessment: No/denies pain     Hand Dominance Left   Extremity/Trunk Assessment Upper Extremity Assessment Upper Extremity Assessment: RUE deficits/detail;LUE deficits/detail RUE Deficits / Details: WFL ROM, grossly 4/5 strength RUE Sensation: WNL RUE Coordination: WNL LUE Deficits / Details: WFL ROM, grossly 4/5 strength LUE Sensation: WNL LUE Coordination: WNL   Lower Extremity Assessment Lower Extremity Assessment:  (Unable to extend knees fully, exhibits grossly 4- to 4/5 strength in LEs. No reports of decreased sensation, states no in regards to having neuropathy, has no foot drop.)   Cervical / Trunk Assessment Cervical / Trunk Assessment: Kyphotic   Communication Communication Communication: No difficulties   Cognition Arousal/Alertness: Awake/alert Behavior During Therapy: WFL for tasks assessed/performed Overall Cognitive Status: No family/caregiver present to determine baseline cognitive functioning                                 General Comments: Patient is alert to self. Is able to state the year but unable to  state the month. He knows father's day is in a couple of days but unable to narrow it down to June. Takes increased time to state he is in the hospital - initially stating it is an institution - and needed cue to state [employer]. With questioning - he masks with humor not asnwering questions directly.     General Comments       Exercises     Shoulder Instructions      Home Living Family/patient expects to be  discharged to:: Private residence Living Arrangements: Alone Available Help at Discharge: Friend(s);Available PRN/intermittently Type of Home: Apartment Home Access: Level entry     Home Layout: One level     Bathroom Shower/Tub: Teacher, early years/pre: Standard     Home Equipment: None          Prior Functioning/Environment Prior Level of Function : Independent/Modified Independent             Mobility Comments: reports furniture walking at home; walks to/from apartment complex office ADLs Comments: Patient reports taking 45 minutes to get dressed if he is leaving the house, but reports he is independent        OT Problem List: Decreased strength;Decreased activity tolerance;Impaired balance (sitting and/or standing);Decreased safety awareness;Decreased cognition;Decreased knowledge of use of DME or AE;Impaired sensation;Pain;Decreased knowledge of precautions      OT Treatment/Interventions: Self-care/ADL training;Therapeutic exercise;Energy conservation;DME and/or AE instruction;Therapeutic activities;Patient/family education;Balance training;Neuromuscular education;Cognitive remediation/compensation    OT Goals(Current goals can be found in the care plan section) Acute Rehab OT Goals OT Goal Formulation: Patient unable to participate in goal setting Time For Goal Achievement: 11/25/21 Potential to Achieve Goals: Fair  OT Frequency: Min 2X/week    Co-evaluation              AM-PAC OT "6 Clicks" Daily Activity     Outcome Measure Help from another person eating meals?: A Little Help from another person taking care of personal grooming?: A Little Help from another person toileting, which includes using toliet, bedpan, or urinal?: A Lot Help from another person bathing (including washing, rinsing, drying)?: A Lot Help from another person to put on and taking off regular upper body clothing?: A Lot Help from another person to put on and taking off  regular lower body clothing?: Total 6 Click Score: 13   End of Session Equipment Utilized During Treatment: Gait belt;Rolling walker (2 wheels) Nurse Communication: Mobility status  Activity Tolerance: Patient tolerated treatment well Patient left: in chair;with call bell/phone within reach  OT Visit Diagnosis: Unsteadiness on feet (R26.81);Other abnormalities of gait and mobility (R26.89);Muscle weakness (generalized) (M62.81)                Time: 8003-4917 OT Time Calculation (min): 37 min Charges:  OT General Charges $OT Visit: 1 Visit OT Evaluation $OT Eval Moderate Complexity: 1 Mod OT Treatments $Self Care/Home Management : 8-22 mins  Bethannie Iglehart, OTR/L [place]  Office [phone] Pager: [place] 11/11/2021, 1:20 PM

## 2021-11-11 NOTE — Progress Notes (Signed)
Physical Therapy Treatment Patient Details Name: John Shields MRN: [id number] DOB: April 11, 1946 Today's Date: 11/11/2021   History of Present Illness 76 year old male with PMH significant for PE, metastatic prostate cancer, T2DM presenting to ED with weakness.  Pt with other recent ED visits for weakness and dizziness. No acute abnormalities on imaging or other testing.    PT Comments    Pt needs NEUROLOGY CONSULT for Parkinson's like symptoms. Currently not diagnosed. Pt OOB in recliner.  General Comments: AxO x 2 following all directions but easily ditracted.  Required repeat finctional cueing to complete task.  Inconsistant. Soft spoken.  Assisted with amb to bathroom and in hallway was difficult.  General transfer comment: 75% VC's on proper hand placement and required several attempts to partially rise from recliner.  Pt present with difficulty initiation of movement.  Rocking several time to complete.  Present with poor forward flex posture.  B hip and knee flex as well as cervical forward flexion.  Pt also present with overall stiffness.  Rigidity. Assisted to bathroom to void and wash hands at sink took 12 min to complete.  Bradykinesia.  Poor self corrective reaction respone.  HIGH FALL RISK. General Gait Details: Very much "Parkinson's Type" gait with delayed initaion (freezing), slow movement (Bradykinesia), short shuffled steps, stiffness, poor forward flex posture and stop and go pattern (festination).  Pt required MAX VC's to "step big" but immediately reverted back to shuffle.  Pt tolerated amb a total of 42 feet but took him 18 min to complete full trip from recliner to bathroom, to hallway then back to room in recliner.  HIGH FALL RISK   Will consult LPT  Recommendations for follow up therapy are one component of a multi-disciplinary discharge planning process, led by the attending physician.  Recommendations may be updated based on patient status, additional functional criteria and  insurance authorization.  Follow Up Recommendations  Skilled nursing-short term rehab (<3 hours/day)     Assistance Recommended at Discharge Intermittent Supervision/Assistance  Patient can return home with the following A lot of help with walking and/or transfers;Assistance with cooking/housework   Equipment Recommendations  None recommended by PT    Recommendations for Other Services       Precautions / Restrictions Precautions Precautions: Fall Precaution Comments: Reports multiple episodes of lowering himself to the ground or falling due to weakness Restrictions Weight Bearing Restrictions: No     Mobility  Bed Mobility               General bed mobility comments: OOB in recliner    Transfers Overall transfer level: Needs assistance Equipment used: Rolling walker (2 wheels), None Transfers: Sit to/from Stand Sit to Stand: Mod assist, +2 safety/equipment           General transfer comment: 75% VC's on proper hand placement and required several attempts to partially rise from recliner.  Pt present with difficulty initiation of movement.  Rocking several time to complete.  Present with poor forward flex posture.  B hip and knee flex as well as cervical forward flexion.  Pt also present with overall stiffness.  Assisted to bathroom to void and wash hands at sink took 12 min to complete.  Bradykinesia.  Poor self corrective reaction respone.  HIGH FALL RISK.    Ambulation/Gait Ambulation/Gait assistance: Min guard, Min assist Gait Distance (Feet): 42 Feet Assistive device: Rolling walker (2 wheels) Gait Pattern/deviations: Festinating, Narrow base of support, Trunk flexed, Shuffle Gait velocity: decreased     General Gait  Details: Very much "Parkinson's Type" gait with delayed initaion (freezing), slow movement (Bradykinesia), short shuffled steps, stiffness, poor forward flex posture and stop and go pattern (festination).  Pt required MAX VC's to "step big" but  immediately reverted back to shuffle.  Pt tolerated amb a total of 42 feet but took him 18 min to complete full trip from recliner to bathroom, to hallway then back to room in recliner.  HIGH FALL RISK   Stairs             Wheelchair Mobility    Modified Rankin (Stroke Patients Only)       Balance                                            Cognition Arousal/Alertness: Awake/alert Behavior During Therapy: WFL for tasks assessed/performed Overall Cognitive Status: No family/caregiver present to determine baseline cognitive functioning                                 General Comments: AxO x 2 following all directions but easily ditracted.  Required repeat finctional cueing to complete task.  Inconsistant.        Exercises      General Comments        Pertinent Vitals/Pain Pain Assessment Pain Assessment: Faces Pain Location: generalized Pain Descriptors / Indicators: Tightness Pain Intervention(s): Monitored during session    Home Living Family/patient expects to be discharged to:: Private residence Living Arrangements: Alone Available Help at Discharge: Friend(s);Available PRN/intermittently Type of Home: Apartment Home Access: Level entry       Home Layout: One level Home Equipment: None      Prior Function            PT Goals (current goals can now be found in the care plan section) Progress towards PT goals: Progressing toward goals    Frequency    Min 2X/week      PT Plan Current plan remains appropriate    Co-evaluation              AM-PAC PT "6 Clicks" Mobility   Outcome Measure  Help needed turning from your back to your side while in a flat bed without using bedrails?: A Lot Help needed moving from lying on your back to sitting on the side of a flat bed without using bedrails?: A Lot Help needed moving to and from a bed to a chair (including a wheelchair)?: A Lot Help needed standing up  from a chair using your arms (e.g., wheelchair or bedside chair)?: A Lot Help needed to walk in hospital room?: A Lot   6 Click Score: 10    End of Session Equipment Utilized During Treatment: Gait belt Activity Tolerance: Treatment limited secondary to medical complications (Comment) Patient left: in chair;with call bell/phone within reach;with chair alarm set;with nursing/sitter in room Nurse Communication: Mobility status PT Visit Diagnosis: History of falling (Z91.81);Muscle weakness (generalized) (M62.81);Difficulty in walking, not elsewhere classified (R26.2)     Time: 1350-1416 PT Time Calculation (min) (ACUTE ONLY): 26 min  Charges:  $Gait Training: 8-22 mins $Therapeutic Activity: 8-22 mins                     Rica Koyanagi  PTA Acute  Rehabilitation Services Pager      [phone]  Office      [phone]

## 2021-11-11 NOTE — ED Notes (Signed)
Patient resting comfortably.   No s/s of distress.

## 2021-11-11 NOTE — ED Notes (Signed)
Patient alert, calm, comfortable.  Medication compliant.

## 2021-11-11 NOTE — Progress Notes (Signed)
[facility], [facility] left [facility] for the primary social worker as the [employer] worker is out on vacation until 26th. TOC will update information as it comes.

## 2021-11-11 NOTE — ED Provider Notes (Signed)
  Physical Exam  BP (!) 146/71 (BP Location: Left Arm)   Pulse 82   Temp 97.9 F (36.6 C) (Oral)   Resp 18   Ht 6' (1.829 m)   Wt 105.7 kg   SpO2 95%   BMI 31.60 kg/m   Physical Exam  Procedures  Procedures  ED Course / MDM   Clinical Course as of 11/11/21 1844  Wed Nov 09, 2021  0902 Chest x-ray interpreted by me as no acute findings.  Awaiting radiology reading. [MB]  66 Was informed by nurse that APS is involved with patient.  Apparently his house is a mess and there is some concern that he is unable to care for self.  We will place social work consult. [MB]  9924 Patient was seen by social work.  She is unable to place him from the ED.  Her recommendation is discharged to home via cab.  It sounds like they are pursuing guardianship for him. [MB]    Clinical Course User Index [MB] Hayden Rasmussen, MD   Medical Decision Making Patient was seen by physical therapy today and there is a concern for possible new onset of parkinsonism. They recommend neurology consult. I talked to neurologist, Dr. Curly Shores. She states that parkinsonism can be worked up outpatient. 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  She feels that this matter can be worked up as an outpatient with outpatient neurology.  At this point, patient is still pending skilled nursing placement.  Amount and/or Complexity of Data Reviewed Labs: ordered. Radiology: ordered.  Risk OTC drugs. Prescription drug management.          Drenda Freeze, MD 11/11/21 [phone]

## 2021-11-11 NOTE — Progress Notes (Signed)
Pt is unable to be placed, unable to pay out of pocket , and still will need a 3 night inpatient stay for SNF placement.   Arlie Solomons.Luverta Korte, MSW, [place]  Transitions of Care Clinical Social Worker I Direct Dial: [phone]  Fax: [phone] [email or web address]

## 2021-11-11 NOTE — Progress Notes (Signed)
CSW, spoke to eligibility at the [place] Pt is not service connected.

## 2021-11-11 NOTE — Progress Notes (Addendum)
CSW attempted to contact pt's APS worker [name] Robertson([phone]) , no answer , unable to leave VM.   Adden 1:30pm CSW spoke with pt's ex-wife Trilby Drummer, in regard to pt's finances, as CSW was told pt has should qualify for Medicaid. Ms. Bobby Rumpf stated she does not know anything about pt's finances. She reported she is currently on her way to DSS to discuss Guardianship.   CSW received a call back from pt's APS worker [name], who stated pt only makes 1253 a month according to his bank. CSW inquired if APS worker can speak with someone at [place] about his Medicaid. APS worker stated he will see what he can do.  Arlie Solomons.Miriam Liles, MSW, [place]  Transitions of Care Clinical Social Worker I Direct Dial: [phone]  Fax: [phone] [email or web address]

## 2021-11-12 MED ORDER — ACETAMINOPHEN 325 MG PO TABS
650.0000 mg | ORAL_TABLET | ORAL | Status: DC | PRN
  Administered 2021-11-12 – 2021-11-14 (×3): 650 mg via ORAL
  Filled 2021-11-12 (×3): qty 2

## 2021-11-12 NOTE — ED Notes (Signed)
Pt was given dinner tray earlier tonight and enjoyed his meal. After eating dinner the Nurse and I assisted Pt to make sure that his bottom was dry and condom cath. Is still in attached. [employer] Eilan Mcinerny CMA/Sitter

## 2021-11-12 NOTE — ED Notes (Signed)
Pt has been cooperative throughout the day and as of this moment the pt is sleeping and resting.

## 2021-11-13 NOTE — ED Notes (Signed)
Sleeping no distressed or disturbed sleep patterns noted respirations are easy.

## 2021-11-14 MED ORDER — DICLOFENAC SODIUM 1 % EX GEL
4.0000 g | Freq: Four times a day (QID) | CUTANEOUS | Status: DC
Start: 2021-11-14 — End: 2021-11-18
  Administered 2021-11-14 – 2021-11-18 (×8): 4 g via TOPICAL
  Filled 2021-11-14 (×2): qty 100

## 2021-11-14 MED ORDER — METOPROLOL TARTRATE 25 MG PO TABS
25.0000 mg | ORAL_TABLET | Freq: Every day | ORAL | Status: DC
  Administered 2021-11-14 – 2021-11-18 (×5): 25 mg via ORAL
  Filled 2021-11-14 (×5): qty 1

## 2021-11-14 MED ORDER — LIDOCAINE 5 % EX PTCH
1.0000 | MEDICATED_PATCH | CUTANEOUS | Status: DC
  Administered 2021-11-14 – 2021-11-18 (×5): 1 via TRANSDERMAL
  Filled 2021-11-14 (×5): qty 1

## 2021-11-14 MED ORDER — ACETAMINOPHEN 500 MG PO TABS: 1000.0000 mg | ORAL_TABLET | Freq: Four times a day (QID) | ORAL | Status: DC | PRN

## 2021-11-14 MED ORDER — HYDROCHLOROTHIAZIDE 25 MG PO TABS
25.0000 mg | ORAL_TABLET | Freq: Every day | ORAL | Status: DC
  Administered 2021-11-14 – 2021-11-18 (×5): 25 mg via ORAL
  Filled 2021-11-14: qty 2
  Filled 2021-11-14 (×3): qty 1
  Filled 2021-11-14: qty 2

## 2021-11-14 MED ORDER — AMLODIPINE BESYLATE 5 MG PO TABS
2.5000 mg | ORAL_TABLET | Freq: Every day | ORAL | Status: DC
  Administered 2021-11-14 – 2021-11-18 (×5): 2.5 mg via ORAL
  Filled 2021-11-14 (×5): qty 1

## 2021-11-14 MED ORDER — MORPHINE SULFATE 15 MG PO TABS: 7.5000 mg | ORAL_TABLET | Freq: Two times a day (BID) | ORAL | Status: DC | PRN

## 2021-11-14 NOTE — Progress Notes (Signed)
Physical Therapy Treatment Patient Details Name: John Shields MRN: [id number] DOB: [birth year] Today's Date: 11/14/2021   History of Present Illness 76 year old male with PMH significant for PE, metastatic prostate cancer, T2DM presenting to ED with weakness.  Pt with other recent ED visits for weakness and dizziness. Pt unable to get up after a fall.    PT Comments    Pt is declining in mobility/function while sitting in our ED awaiting placement.  Per MD PN, request for a Neurology Consult "can be done as Out Patient".  Meantime, pt is not progressing.    General Comments: AxO x 3 following all commands.  Watching TV he asked "is it still raining outside?"  Pt c/o of weakness as well as frustration.  "I can't open my food".  Pt with lunch tray in front of him and food spilled on gown/bed.  Pt rigid throughout.  Dexterity is delayed.  Unable to grasp his utensils.  Unable to readjust in bed.   Assisted OOB to [facility] was VERY difficult.  General bed mobility comments: pt required increased asisst this session.  Pt was unable to initiate.  Movements are slow and delayed.  B hand grip is strong.  B UE strength is WNL. B LE strength is fair+. Required + 2 Max Assist to sit EOB with imability to complete scooting.  Posture is poor and flexed.  Rigid. Pt was unable to sit safely without any back support.  Total posterior LOB with no self reactive response to correct.  Motor Dysfunctions of bradykenesia and freezing are present. General transfer comment: pt required increased asisst this session.  Present with increased rigidity and poor initiation.  Required + 2 Total Assist to transfer from elevated bed to 1/4 imcomplete turn to [facility] with near fall as pt missed seat.  B hips and knees remained flexed.  Pt slowly lowering towards the floor and unable to self correct.  Asissted off BSC also required + 2 Total Assist x 3 attempts.  Pt was unable to stand erect, barley lifting buttocks off BSC.General Gait  Details: Increased difficulty standing erect and taking any functional steps.  Freezing.  Rigid.  Used B Designer, television/film set for increased support.  Barely able to advance 2 feet with 75% VC's "big steps" and "step up" which pt did but quickly reverted back to a frozen stance.  Last session pt amb 42 feet with a regular walker.  Will consult LPT that his Motor Dysfunctions continue to enable any progress.     Recommendations for follow up therapy are one component of a multi-disciplinary discharge planning process, led by the attending physician.  Recommendations may be updated based on patient status, additional functional criteria and insurance authorization.  Follow Up Recommendations  Skilled nursing-short term rehab (<3 hours/day)     Assistance Recommended at Discharge Frequent or constant Supervision/Assistance  Patient can return home with the following Two people to help with walking and/or transfers;Two people to help with bathing/dressing/bathroom   Equipment Recommendations  None recommended by PT    Recommendations for Other Services       Precautions / Restrictions Precautions Precautions: Fall Precaution Comments: multiple reports of falls and inability to get self up Restrictions Weight Bearing Restrictions: No     Mobility  Bed Mobility Overal bed mobility: Needs Assistance Bed Mobility: Supine to Sit     Supine to sit: HOB elevated, +2 for safety/equipment, Max assist     General bed mobility comments: pt required increased asisst this session.  Pt was unable to initiate.  Movements are slow and delayed.  B hand grip is strong.  Required + 2 Max Assist to sit EOB with imability to complete scooting.  Posture is poor and flexed.  Pt was unable to sit safely without any back support.  Total posterior LOB with no self reactive response to correct.  Motor Dysfunctions of bradykenesia and freezing are present.    Transfers Overall transfer level: Needs  assistance Equipment used: Rolling walker (2 wheels), None   Sit to Stand: Total assist, +2 physical assistance, +2 safety/equipment           General transfer comment: pt required increased asisst this session.  Present with increased rigidity and poor initiation.  Required + 2 Total Assist to transfer from elevated bed to 1/4 imcomplete turn to [facility] with near fall as pt missed seat.  B hips and knees remained flexed.  Pt slowly lowering towards the floor and unable to self correct.  Asissted off BSC also required + 2 Total Assist x 3 attempts.  Pt was unable to stand erect, barley lifting buttocks off BSC.    Ambulation/Gait Ambulation/Gait assistance: Max assist, Total assist, +2 physical assistance, +2 safety/equipment Gait Distance (Feet): 2 Feet Assistive device: Bilateral platform walker (EVA walker) Gait Pattern/deviations: Festinating, Narrow base of support, Trunk flexed, Shuffle Gait velocity: decreased     General Gait Details: Increased difficulty standing erect and taking any functional steps.  Freezing.  Rigid.  Used B Designer, television/film set for increased support.  Barely able to advance 2 feet with 75% VC's "big steps" and "step up" which pt did but quickly reverted back to a frozen stance.  Last session pt amb 42 feet with a regular walker.   Stairs             Wheelchair Mobility    Modified Rankin (Stroke Patients Only)       Balance                                            Cognition Arousal/Alertness: Awake/alert Behavior During Therapy: WFL for tasks assessed/performed Overall Cognitive Status: Within Functional Limits for tasks assessed                                 General Comments: AxO x 3 following all commands.  Watching TV he asked "is it still raining outside?"  Pt c/o of weakness as well as frustration.  "I can't open my food".  Pt with lunch tray in front of him and food spilled on gown/bed.  Pt rigid  throughout.  Pt wanted to go somewhere where he could look out a window.        Exercises      General Comments        Pertinent Vitals/Pain Pain Assessment Pain Assessment: No/denies pain    Home Living                          Prior Function            PT Goals (current goals can now be found in the care plan section) Progress towards PT goals: Progressing toward goals    Frequency    Min 2X/week      PT Plan Current plan  remains appropriate    Co-evaluation              AM-PAC PT "6 Clicks" Mobility   Outcome Measure  Help needed turning from your back to your side while in a flat bed without using bedrails?: Total Help needed moving from lying on your back to sitting on the side of a flat bed without using bedrails?: Total Help needed moving to and from a bed to a chair (including a wheelchair)?: Total Help needed standing up from a chair using your arms (e.g., wheelchair or bedside chair)?: Total Help needed to walk in hospital room?: Total Help needed climbing 3-5 steps with a railing? : Total 6 Click Score: 6    End of Session Equipment Utilized During Treatment: Gait belt Activity Tolerance: Treatment limited secondary to medical complications (Comment) Patient left: in chair;with call bell/phone within reach;with chair alarm set;with nursing/sitter in room Nurse Communication: Mobility status PT Visit Diagnosis: History of falling (Z91.81);Muscle weakness (generalized) (M62.81);Difficulty in walking, not elsewhere classified (R26.2)     Time: 6861-6837 PT Time Calculation (min) (ACUTE ONLY): 28 min  Charges:  $Gait Training: 8-22 mins $Therapeutic Activity: 8-22 mins                     Rica Koyanagi  PTA Acute  Rehabilitation Services Office M-F          [phone] Weekend pager [phone]

## 2021-11-14 NOTE — ED Provider Notes (Signed)
Emergency Medicine Observation Re-evaluation Note  John Shields is a 76 y.o. male, seen on rounds today.  Pt initially presented to the ED for complaints of Shortness of Breath Currently, the patient is sleeping.  Physical Exam  BP (!) 184/110 (BP Location: Left Arm)   Pulse 84   Temp 97.9 F (36.6 C) (Oral)   Resp 20   Ht 6' (1.829 m)   Wt 105.7 kg   SpO2 93%   BMI 31.60 kg/m  Physical Exam General: NAD Cardiac: RRR Lungs: Equal chest rise without tachypnea Psych: sleeping  ED Course / MDM  EKG:EKG Interpretation  Date/Time:  Wednesday November 09 2021 08:04:58 EDT Ventricular Rate:  110 PR Interval:  163 QRS Duration: 142 QT Interval:  362 QTC Calculation: 490 R Axis:   -78 Text Interpretation: Sinus tachycardia RBBB and LAFB Left ventricular hypertrophy ST elevation inferior, more pronounced than prior yesterday Confirmed by John Shields [phone]) on 11/09/2021 8:12:43 AM  I have reviewed the labs performed to date as well as medications administered while in observation.  Recent changes in the last 24 hours include patient was noted to be hypertensive over the past 24 hours.  I reviewed the patient's medications and he is not were medications for blood pressure and only has been getting 1 in the emergency department.  We will add the other 3.  He also has been complaining of low back pain.  We will add diclofenac gel and Tylenol..  Plan  Current plan is for placement.  John Shields is not under involuntary commitment.     John Etienne, DO 11/14/21 [phone]

## 2021-11-14 NOTE — ED Notes (Signed)
Gave pt breakfast tray. Pt unable to open packaged items on tray. Pt unable to identify the button to raise his bed. Pt said he was unable to move himself to the head to the bed. Pt did not provide direct answers to questions. For example, I asked pt what is today's date. Pt responded, "Do you know what day it is?" I asked pt what he would like to drink, and pt responded "Surprise me."

## 2021-11-15 DIAGNOSIS — Z8546 Personal history of malignant neoplasm of prostate: Secondary | ICD-10-CM | POA: Diagnosis not present

## 2021-11-15 DIAGNOSIS — I10 Essential (primary) hypertension: Secondary | ICD-10-CM | POA: Diagnosis present

## 2021-11-15 DIAGNOSIS — N390 Urinary tract infection, site not specified: Secondary | ICD-10-CM | POA: Diagnosis present

## 2021-11-15 DIAGNOSIS — Z85528 Personal history of other malignant neoplasm of kidney: Secondary | ICD-10-CM | POA: Diagnosis not present

## 2021-11-15 DIAGNOSIS — B964 Proteus (mirabilis) (morganii) as the cause of diseases classified elsewhere: Secondary | ICD-10-CM | POA: Diagnosis present

## 2021-11-15 DIAGNOSIS — Z9079 Acquired absence of other genital organ(s): Secondary | ICD-10-CM | POA: Diagnosis not present

## 2021-11-15 DIAGNOSIS — Z7984 Long term (current) use of oral hypoglycemic drugs: Secondary | ICD-10-CM | POA: Diagnosis not present

## 2021-11-15 DIAGNOSIS — I69354 Hemiplegia and hemiparesis following cerebral infarction affecting left non-dominant side: Secondary | ICD-10-CM | POA: Diagnosis not present

## 2021-11-15 DIAGNOSIS — R0602 Shortness of breath: Secondary | ICD-10-CM | POA: Diagnosis present

## 2021-11-15 DIAGNOSIS — R627 Adult failure to thrive: Secondary | ICD-10-CM | POA: Diagnosis present

## 2021-11-15 DIAGNOSIS — W19XXXA Unspecified fall, initial encounter: Secondary | ICD-10-CM | POA: Diagnosis present

## 2021-11-15 DIAGNOSIS — D696 Thrombocytopenia, unspecified: Secondary | ICD-10-CM | POA: Diagnosis present

## 2021-11-15 DIAGNOSIS — Z7982 Long term (current) use of aspirin: Secondary | ICD-10-CM | POA: Diagnosis not present

## 2021-11-15 DIAGNOSIS — G9341 Metabolic encephalopathy: Secondary | ICD-10-CM

## 2021-11-15 DIAGNOSIS — Z79899 Other long term (current) drug therapy: Secondary | ICD-10-CM | POA: Diagnosis not present

## 2021-11-15 DIAGNOSIS — Z86711 Personal history of pulmonary embolism: Secondary | ICD-10-CM | POA: Diagnosis not present

## 2021-11-15 DIAGNOSIS — I447 Left bundle-branch block, unspecified: Secondary | ICD-10-CM | POA: Diagnosis present

## 2021-11-15 DIAGNOSIS — E785 Hyperlipidemia, unspecified: Secondary | ICD-10-CM | POA: Diagnosis present

## 2021-11-15 DIAGNOSIS — E669 Obesity, unspecified: Secondary | ICD-10-CM | POA: Diagnosis present

## 2021-11-15 DIAGNOSIS — Z7901 Long term (current) use of anticoagulants: Secondary | ICD-10-CM | POA: Diagnosis not present

## 2021-11-15 DIAGNOSIS — F01C0 Vascular dementia, severe, without behavioral disturbance, psychotic disturbance, mood disturbance, and anxiety: Secondary | ICD-10-CM | POA: Diagnosis present

## 2021-11-15 DIAGNOSIS — I252 Old myocardial infarction: Secondary | ICD-10-CM | POA: Diagnosis not present

## 2021-11-15 DIAGNOSIS — I2510 Atherosclerotic heart disease of native coronary artery without angina pectoris: Secondary | ICD-10-CM | POA: Diagnosis present

## 2021-11-15 DIAGNOSIS — E119 Type 2 diabetes mellitus without complications: Secondary | ICD-10-CM | POA: Diagnosis present

## 2021-11-15 DIAGNOSIS — I69398 Other sequelae of cerebral infarction: Secondary | ICD-10-CM | POA: Diagnosis not present

## 2021-11-15 DIAGNOSIS — Z8249 Family history of ischemic heart disease and other diseases of the circulatory system: Secondary | ICD-10-CM | POA: Diagnosis not present

## 2021-11-15 LAB — BASIC METABOLIC PANEL
Anion gap: 10 (ref 5–15)
BUN: 23 mg/dL (ref 8–23)
CO2: 26 mmol/L (ref 22–32)
Calcium: 9.4 mg/dL (ref 8.9–10.3)
Chloride: 99 mmol/L (ref 98–111)
Creatinine, Ser: 0.92 mg/dL (ref 0.61–1.24)
GFR, Estimated: >60 mL/min (ref >60)
Glucose, Bld: 124 mg/dL — ABNORMAL HIGH (ref 70–99)
Potassium: 4.8 mmol/L (ref 3.5–5.1)
Sodium: 135 mmol/L (ref 135–145)

## 2021-11-15 LAB — GLUCOSE, CAPILLARY: Glucose-Capillary: 114 mg/dL — ABNORMAL HIGH (ref 70–99)

## 2021-11-15 LAB — URINALYSIS, ROUTINE W REFLEX MICROSCOPIC
Bacteria, UA: NONE SEEN
Bilirubin Urine: NEGATIVE
Glucose, UA: NEGATIVE mg/dL
Ketones, ur: NEGATIVE mg/dL
Nitrite: NEGATIVE
Protein, ur: 100 mg/dL — AB
Specific Gravity, Urine: 1.018 (ref 1.005–1.030)
pH: 8 (ref 5.0–8.0)

## 2021-11-15 LAB — CBC WITH DIFFERENTIAL/PLATELET
Abs Immature Granulocytes: 0.31 10*3/uL — ABNORMAL HIGH (ref 0.00–0.07)
Basophils Absolute: 0 10*3/uL (ref 0.0–0.1)
Basophils Relative: 1 %
Eosinophils Absolute: 0.1 10*3/uL (ref 0.0–0.5)
Eosinophils Relative: 1 %
HCT: 42.5 % (ref 39.0–52.0)
Hemoglobin: 13.3 g/dL (ref 13.0–17.0)
Immature Granulocytes: 4 %
Lymphocytes Relative: 24 %
Lymphs Abs: 1.8 10*3/uL (ref 0.7–4.0)
MCH: 21.6 pg — ABNORMAL LOW (ref 26.0–34.0)
MCHC: 31.3 g/dL (ref 30.0–36.0)
MCV: 69.1 fL — ABNORMAL LOW (ref 80.0–100.0)
Monocytes Absolute: 0.6 10*3/uL (ref 0.1–1.0)
Monocytes Relative: 8 %
Neutro Abs: 4.5 10*3/uL (ref 1.7–7.7)
Neutrophils Relative %: 62 %
Platelets: 119 10*3/uL — ABNORMAL LOW (ref 150–400)
RBC: 6.15 MIL/uL — ABNORMAL HIGH (ref 4.22–5.81)
RDW: 14.6 % (ref 11.5–15.5)
WBC: 7.3 10*3/uL (ref 4.0–10.5)
nRBC: 0 % (ref 0.0–0.2)

## 2021-11-15 LAB — CK: Total CK: 1907 U/L — ABNORMAL HIGH (ref 49–397)

## 2021-11-15 MED ORDER — ENOXAPARIN SODIUM 40 MG/0.4ML IJ SOSY
40.0000 mg | PREFILLED_SYRINGE | INTRAMUSCULAR | Status: DC
  Administered 2021-11-15 – 2021-11-17 (×3): 40 mg via SUBCUTANEOUS
  Filled 2021-11-15 (×4): qty 0.4

## 2021-11-15 MED ORDER — ACETAMINOPHEN 325 MG PO TABS: 650.0000 mg | ORAL_TABLET | Freq: Four times a day (QID) | ORAL | Status: DC | PRN

## 2021-11-15 MED ORDER — ONDANSETRON HCL 4 MG PO TABS: 4.0000 mg | ORAL_TABLET | Freq: Four times a day (QID) | ORAL | Status: DC | PRN

## 2021-11-15 MED ORDER — ACETAMINOPHEN 650 MG RE SUPP: 650.0000 mg | Freq: Four times a day (QID) | RECTAL | Status: DC | PRN

## 2021-11-15 MED ORDER — SODIUM CHLORIDE 0.9 % IV BOLUS
1000.0000 mL | Freq: Once | INTRAVENOUS | Status: AC
  Administered 2021-11-15: 1000 mL via INTRAVENOUS

## 2021-11-15 MED ORDER — SODIUM CHLORIDE 0.9 % IV SOLN
1.0000 g | Freq: Once | INTRAVENOUS | Status: AC
  Administered 2021-11-15: 1 g via INTRAVENOUS
  Filled 2021-11-15: qty 10

## 2021-11-15 MED ORDER — LACTATED RINGERS IV SOLN: INTRAVENOUS | Status: DC

## 2021-11-15 MED ORDER — ALBUTEROL SULFATE (2.5 MG/3ML) 0.083% IN NEBU
2.5000 mg | INHALATION_SOLUTION | RESPIRATORY_TRACT | Status: DC | PRN
Start: 2021-11-15 — End: 2021-11-18

## 2021-11-15 MED ORDER — ONDANSETRON HCL 4 MG/2ML IJ SOLN: 4.0000 mg | Freq: Four times a day (QID) | INTRAMUSCULAR | Status: DC | PRN

## 2021-11-15 MED ORDER — CEFTRIAXONE SODIUM 2 G IJ SOLR
2.0000 g | INTRAMUSCULAR | Status: DC
  Administered 2021-11-16 – 2021-11-17 (×2): 2 g via INTRAVENOUS
  Filled 2021-11-15: qty 20

## 2021-11-15 NOTE — H&P (Signed)
History and Physical    Patient: John Shields QMG:[id number] DOB: 02-06-1946 DOA: 11/09/2021 DOS: the patient was seen and examined on 11/15/2021 PCP: [facility]  Patient coming from: Home  Chief Complaint:  Chief Complaint  Patient presents with   Shortness of Breath   HPI: John Shields is a 76 y.o. male with medical history significant of type 2 diabetes, history of sepsis, hypertension, CAD, history of MI, LBBB, history of pulmonary embolism, history of AKI, type 2 diabetes, prostate cancer, right renal cancer, history of CVA with left-sided weakness, hyperlipidemia, vascular dementia, confusion, rhabdomyolysis who has been in the emergency department for the past 6 days receiving physical therapy due to deconditioning and was waiting for the transitional care team for placement.  However, the staff has noted that over the past 2 days he has had more confusion and his generalized weakness has become a lot more pronounced.  He is confused at baseline and unable to answer further questions.  He answers simple questions.  He denied headache, abdominal, back or chest pain.  No dyspnea.  ED course: Initial vital signs were temperature 97.9 F, pulse 84, respirations 16, BP 184/110 mmHg O2 sat 93% on room air.  The patient received 1000 mL of normal saline bolus and 1 g of ceftriaxone IVPB.  Lab work: His urinalysis cloudy with hemoglobinuria, proteinuria 100 mg/dL, large leukocyte esterase with no bacteria, but positive mucus, hyaline casts and triple phosphate crystals.  CBC with a white count 7.3, hemoglobin 13.3 g/dL platelets 119.  BMP shows a glucose of 124 mg/dL, but is otherwise unremarkable.  Total CK 1907 units/L.  Imaging: Two-view chest radiograph with no active cardiopulmonary disease except on 11/09/2021.  MRI of brain from 11/10/2021 with no acute intracranial abnormality.  Review of Systems: As mentioned in the history of present illness. All other systems reviewed and  are negative. Past Medical History:  Diagnosis Date   Diabetes mellitus without complication ([place])    Foley catheter in place    Headache    History of sepsis 12/27/2016   sepsis from prostate abscess post prostate bx   Hypertension    LBBB (left bundle branch block)    Myocardial infarction [facility])    Pre-diabetes    Prostate cancer, primary, 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 [facility]) urologist-  dr Tresa Moore   dx 07/ 2018 bx-- Gleason 8, PSA 400, vol 345m-- abd. lymphadenopathy   Renal cancer, right ([place]    Rhabdomyolysis 06/10/2021   [place]    Thrombocytopenia ([place]    Urine retention    intermittant since 2016   Past Surgical History:  Procedure Laterality Date   NO PAST SURGERIES     ORCHIECTOMY Bilateral 01/31/2017   Procedure: ORCHIECTOMY;  Surgeon: MAlexis Frock MD;  [facility]  Service: Urology;  Laterality: Bilateral;   TONSILLECTOMY     Social History:  reports that he has never smoked. He has never used smokeless tobacco. He reports that he does not drink alcohol and does not use drugs.  No Known Allergies  Family History  Problem Relation Age of Onset   Heart attack Father    Heart attack Brother     Prior to Admission medications   Medication Sig Start Date End Date Taking? Authorizing Provider  aspirin 325 MG EC tablet Take 650 mg by mouth daily.   Yes [provider]  Multiple Vitamins-Minerals (CENTRUM SILVER 50+MEN) TABS Take 1 tablet by mouth daily.   Yes  [provider]  amLODipine (NORVASC) 2.5 MG tablet Take 2.5 mg by mouth daily. Patient not taking: Reported on 10/08/2021 06/28/21   [provider]  atorvastatin (LIPITOR) 40 MG tablet Take 40 mg by mouth daily. Patient not taking: Reported on 10/08/2021 07/13/21   [provider]  hydrALAZINE (APRESOLINE) 25 MG tablet Take by mouth daily. Patient not taking: Reported on 10/08/2021 06/27/21   [provider]   hydrochlorothiazide (HYDRODIURIL) 25 MG tablet Take 25 mg by mouth daily. Patient not taking: Reported on 10/08/2021 06/27/21   [provider]  metoprolol tartrate (LOPRESSOR) 25 MG tablet Take 25 mg by mouth daily. Patient not taking: Reported on 10/08/2021    [provider]  tamsulosin (FLOMAX) 0.4 MG CAPS capsule Take 1 capsule (0.4 mg total) by mouth daily. Patient not taking: Reported on 11/09/2021 11/08/21   Sherwood Gambler, MD    Physical Exam: Vitals:   11/14/21 0658 11/14/21 1700 11/15/21 0636 11/15/21 1149  BP: (!) 184/110 (!) 160/96 (!) 161/94 139/87  Pulse: 84 87 91 91  Resp:  '16 18 12  '$ Temp: 97.9 F (36.6 C)  97.9 F (36.6 C) 97.9 F (36.6 C)  TempSrc: Oral  Oral Oral  SpO2: 93% 97% 92% 95%  Weight:      Height:       Physical Exam Vitals reviewed.  Constitutional:      Appearance: He is well-developed.  HENT:     Head: Normocephalic.     Mouth/Throat:     Mouth: Mucous membranes are moist.  Eyes:     General: No scleral icterus.    Pupils: Pupils are equal, round, and reactive to light.  Neck:     Vascular: No JVD.  Cardiovascular:     Rate and Rhythm: Normal rate and regular rhythm.  Pulmonary:     Effort: Pulmonary effort is normal.     Breath sounds: Normal breath sounds. No decreased breath sounds, wheezing, rhonchi or rales.  Abdominal:     General: Bowel sounds are normal. There is no distension.     Palpations: Abdomen is soft.     Tenderness: There is no abdominal tenderness. There is no right CVA tenderness, left CVA tenderness or guarding.  Musculoskeletal:     Cervical back: Neck supple.     Right lower leg: No edema.     Left lower leg: No edema.  Skin:    General: Skin is warm and dry.  Neurological:     Mental Status: He is alert. He is disoriented.  Psychiatric:        Mood and Affect: Mood normal.        Behavior: Behavior normal.   Data Reviewed:  There are no new results to review at this time.  Assessment  and Plan: Principal Problem:   Acute lower UTI In the setting of:   Worsening generalized weakness   Acute metabolic encephalopathy Admit to telemetry/inpatient. Continue safety monitoring. Supportive care. Continue ceftriaxone 1 g IVPB daily. Follow-up urine culture and sensitivity. Resume physical therapy as an inpatient in 24 to 48 hours. TOC team will work on placement once cleared for discharge.  Active Problems:   Rhabdomyolysis Continue LR 100 mL/h. Monitor for signs of volume overload.    Essential hypertension Continue amlodipine 2.5 mg p.o. daily. Continue hydralazine 25 mg p.o. daily. Continue metoprolol 25 mg p.o. daily. Continue hydrochlorothiazide 25 mg p.o. daily. Monitor BP, HR, renal function electrolytes.    Diabetes mellitus type II, non  insulin dependent (HCC) Carbohydrate modified diet. CBG monitoring before meals and bedtime.    Thrombocytopenia (HCC) Monitor platelet count.      Advance Care Planning:   Code Status: Full Code   Consults: TOC team and PT.  Family Communication:   Severity of Illness: The appropriate patient status for this patient is INPATIENT. Inpatient status is judged to be reasonable and necessary in order to provide the required intensity of service to ensure the patient's safety. The patient's presenting symptoms, physical exam findings, and initial radiographic and laboratory data in the context of their chronic comorbidities is felt to place them at high risk for further clinical deterioration. Furthermore, it is not anticipated that the patient will be medically stable for discharge from the hospital within 2 midnights of admission.   * I certify that at the point of admission it is my clinical judgment that the patient will require inpatient hospital care spanning beyond 2 midnights from the point of admission due to high intensity of service, high risk for further deterioration and high frequency of surveillance  required.*  Author: Reubin Milan, MD 11/15/2021 6:24 PM  For on call review [email or web address].   This document was prepared using Dragon voice recognition software and may contain some unintended transcription errors.

## 2021-11-15 NOTE — ED Notes (Signed)
Patient refused to eat his breakfast at this time very sleepy

## 2021-11-15 NOTE — ED Notes (Signed)
IVF running. Patient tolerating

## 2021-11-15 NOTE — Progress Notes (Signed)
Pt still unable to be placed at this time. Pt still does not meet  3 night inpatient stay, still unable to pay , and does not have Medicaid in place. [facility] financial is looking into pt's medicaid again.   Arlie Solomons.Tacey Dimaggio, MSW, [place]  Transitions of Care Clinical Social Worker I Direct Dial: [phone]  Fax: [phone] [email or web address]

## 2021-11-15 NOTE — ED Notes (Signed)
Lab contacted to add on urine culture.

## 2021-11-15 NOTE — ED Provider Notes (Addendum)
Emergency Medicine Observation Re-evaluation Note  John Shields is a 76 y.o. male, seen on rounds today.  Pt initially presented to the ED for complaints of physical deconditioning and trouble managing at home. Currently TOC team is involved and seeking placement. It also appears PT has been inconsistent during ED stay, so it appears little progress there as well. Pt without new c/o this AM.   Physical Exam  BP (!) 161/94 (BP Location: Left Arm)   Pulse 91   Temp 97.9 F (36.6 C) (Oral)   Resp 18   Ht 1.829 m (6')   Wt 105.7 kg   SpO2 92%   BMI 31.60 kg/m  Physical Exam General: content, nad Cardiac: regular  rate Lungs: breathing comfortably   ED Course / MDM    I have reviewed the labs performed to date as well as medications administered while in observation.  Recent changes in the last 24 hours include ED obs, TOC reassessment, PT.   Plan   Will ask PT to work with patient while in ED so he does not become even more deconditioned - also ? Whether PT can give patient and ED staff activities for patient to complete at others times of day to increase strength and endurance.   TOC team re-consulted as no new updates for days - need to more aggressively pursue placement.    Lajean Saver, MD 11/15/21 [phone]   PT/RN indicates seems as though pt generally weaker, more deconditioned. Repeat ck and UA are pending.   UA c/w uti, urine culture sent. Rocephin iv.  Ck is also increased from prior - NS bolus, UA does dip +blood but no rbcs ? Mild rhabdo.   Hospitalists consulted for admission.      Lajean Saver, MD 11/15/21 [phone]
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
## 2021-11-15 NOTE — Progress Notes (Addendum)
Occupational Therapy Treatment Patient Details Name: John Shields MRN: [id number] DOB: [birth year] Today's Date: 11/15/2021   History of present illness 76 year old male with PMH significant for PE, metastatic prostate cancer, T2DM presenting to ED with weakness.  Pt with other recent ED visits for weakness and dizziness. Pt unable to get up after a fall.   OT comments  Patient appears to have change in communication since last OT session with patient choosing to whisper when verbalizing minimal words. Patient was noted to attempt to write answers to questions in the air. Patient was max A x2 for standing with posterior leaning initially and then leaning to R with increased time. Patient would continue to benefit from skilled OT services at this time while admitted and after d/c to address noted deficits in order to improve overall safety and independence in ADLs.     Recommendations for follow up therapy are one component of a multi-disciplinary discharge planning process, led by the attending physician.  Recommendations may be updated based on patient status, additional functional criteria and insurance authorization.    Follow Up Recommendations  Skilled nursing-short term rehab (<3 hours/day)    Assistance Recommended at Discharge Frequent or constant Supervision/Assistance  Patient can return home with the following  Assistance with cooking/housework;Direct supervision/assist for medications management;Direct supervision/assist for financial management;Assist for transportation;Help with stairs or ramp for entrance;Two people to help with walking and/or transfers;Two people to help with bathing/dressing/bathroom   Equipment Recommendations  Other (comment) (defer to next venue)    Recommendations for Other Services      Precautions / Restrictions Precautions Precautions: Fall Precaution Comments: multiple reports of falls and inability to get self up Restrictions Weight Bearing  Restrictions: No       Mobility Bed Mobility Overal bed mobility: Needs Assistance Bed Mobility: Supine to Sit     Supine to sit: HOB elevated, +2 for safety/equipment, Max assist Sit to supine: Max assist, +2 for physical assistance   General bed mobility comments: pt required increased asisst this session.Marland Kitchen  Required + 2 Max Assist to sit EOB with imability to complete scooting.  patient required increased time to process all cues and attempt tasks.    Transfers                         Balance Overall balance assessment: Needs assistance Sitting-balance support: No upper extremity supported, Feet supported Sitting balance-Leahy Scale: Fair Sitting balance - Comments: mild left and posterior lean with fatigue   Standing balance support: During functional activity, Reliant on assistive device for balance Standing balance-Leahy Scale: Poor Standing balance comment: patient needed physical assist for standing balance with posterior leaning and lateral lean to R with fatigue                           ADL either performed or assessed with clinical judgement   ADL Overall ADL's : Needs assistance/impaired                       Lower Body Dressing Details (indicate cue type and reason): patient was max A to don bilateral socks in bed with patient not attempting to bring ankles to knees even with education. made effort to pick up LLE off bed when sock was donned.   Toilet Transfer Details (indicate cue type and reason): patient attempted standing trials at edge ofb ed with noted forward flexion in trunk and  knees with multimodal cues for proper posture. patient was noted to have no safety awareness with patient at ttimes removing hand to point at things v.s. using voice. patient was educated on importance of using voice but patient continued to attempt to communciate via other methods.                Extremity/Trunk Assessment Upper Extremity  Assessment Upper Extremity Assessment: Overall WFL for tasks assessed            Vision       Perception     Praxis      Cognition Arousal/Alertness: Awake/alert Behavior During Therapy: Flat affect                                   General Comments: unable to fully assess patients cog with patient wispering all speech and even at times attempting to write answers in the air to therapist. patient continued to look up to ceiling at end of session with BUE raised overhead. patient indicated that he was seeing something and when further questioned he reported 'jesus".        Exercises      Shoulder Instructions       General Comments      Pertinent Vitals/ Pain       Pain Assessment Pain Assessment: No/denies pain  Home Living                                          Prior Functioning/Environment              Frequency  Min 2X/week        Progress Toward Goals  OT Goals(current goals can now be found in the care plan section)  Progress towards OT goals: Progressing toward goals     Plan Discharge plan remains appropriate    Co-evaluation    PT/OT/SLP Co-Evaluation/Treatment: Yes Reason for Co-Treatment: For patient/therapist safety;Complexity of the patient's impairments (multi-system involvement) PT goals addressed during session: Mobility/safety with mobility OT goals addressed during session: ADL's and self-care      AM-PAC OT "6 Clicks" Daily Activity     Outcome Measure   Help from another person eating meals?: A Little Help from another person taking care of personal grooming?: A Little Help from another person toileting, which includes using toliet, bedpan, or urinal?: Total Help from another person bathing (including washing, rinsing, drying)?: A Lot Help from another person to put on and taking off regular upper body clothing?: A Lot Help from another person to put on and taking off regular lower  body clothing?: Total 6 Click Score: 12    End of Session Equipment Utilized During Treatment: Gait belt;Rolling walker (2 wheels)  OT Visit Diagnosis: Unsteadiness on feet (R26.81);Other abnormalities of gait and mobility (R26.89);Muscle weakness (generalized) (M62.81)   Activity Tolerance Patient limited by fatigue;Patient limited by pain   Patient Left in bed;with call bell/phone within reach;with bed alarm set   Nurse Communication Mobility status        Time: 1542-1610 OT Time Calculation (min): 28 min  Charges: OT General Charges $OT Visit: 1 Visit OT Treatments $Therapeutic Activity: 8-22 mins  Jackelyn Poling OTR/L, MS Acute Rehabilitation Department Office# [phone] Pager# [phone]   Marcellina Millin 11/15/2021, 4:39 PM

## 2021-11-15 NOTE — Progress Notes (Signed)
Physical Therapy Treatment Patient Details Name: Janziel Hockett MRN: [id number] DOB: Dec 28, 1945 Today's Date: 11/15/2021   History of Present Illness 76 year old male with PMH significant for PE, metastatic prostate cancer, T2DM presenting to ED with weakness.  Pt with other recent ED visits for weakness and dizziness. Pt unable to get up after a fall.    PT Comments    Pt assisted with performing sit to stands x4 today for instruction and strengthening.  Pt continues to require +2 assist to mobilize. Pt slow to process, requiring increased time and also provided with multimodal cues.  Pt whispering today and had minimal verbalizations which is a change from initial evaluation.  Pt continues to require increased assist and presents as high fall risk so continue to recommend SNF upon d/c.     Recommendations for follow up therapy are one component of a multi-disciplinary discharge planning process, led by the attending physician.  Recommendations may be updated based on patient status, additional functional criteria and insurance authorization.  Follow Up Recommendations  Skilled nursing-short term rehab (<3 hours/day)     Assistance Recommended at Discharge Frequent or constant Supervision/Assistance  Patient can return home with the following Two people to help with walking and/or transfers;Two people to help with bathing/dressing/bathroom   Equipment Recommendations  None recommended by PT    Recommendations for Other Services       Precautions / Restrictions Precautions Precautions: Fall Precaution Comments: pt has had multiple falls Restrictions Weight Bearing Restrictions: No     Mobility  Bed Mobility Overal bed mobility: Needs Assistance Bed Mobility: Supine to Sit     Supine to sit: HOB elevated, +2 for safety/equipment, Max assist Sit to supine: Max assist, +2 for physical assistance   General bed mobility comments: pt required increased asisst this session.Marland Kitchen   Required + 2 Max Assist to sit EOB with imability to complete scooting even with multimodal cues provided. patient required increased time to process all cues and attempt tasks.    Transfers Overall transfer level: Needs assistance Equipment used: Rolling walker (2 wheels) Transfers: Sit to/from Stand Sit to Stand: Max assist, +2 physical assistance           General transfer comment: pt attempting to intiate today however requiring assist to rise and steady, presents with posterior lean of LEs against bed to self assist, cues for weight shifting and technique; performed x4 for instruction and strengthening    Ambulation/Gait                   Stairs             Wheelchair Mobility    Modified Rankin (Stroke Patients Only)       Balance Overall balance assessment: Needs assistance Sitting-balance support: No upper extremity supported, Feet supported Sitting balance-Leahy Scale: Fair Sitting balance - Comments: mild left and posterior lean with fatigue   Standing balance support: During functional activity, Reliant on assistive device for balance, Bilateral upper extremity supported Standing balance-Leahy Scale: Poor Standing balance comment: patient needed physical assist for standing balance with posterior leaning and lateral lean to R with fatigue                            Cognition Arousal/Alertness: Awake/alert Behavior During Therapy: Flat affect  General Comments: unable to fully assess patients cog with patient whispering all speech and even at times attempting to write answers in the air to therapist. patient continued to look up to ceiling at end of session with BUE raised overhead. patient indicated that he was seeing something and when further questioned he reported 'Jesus".        Exercises      General Comments        Pertinent Vitals/Pain Pain Assessment Pain Assessment:  No/denies pain    Home Living                          Prior Function            PT Goals (current goals can now be found in the care plan section) Progress towards PT goals: Progressing toward goals    Frequency    Min 2X/week      PT Plan Current plan remains appropriate    Co-evaluation PT/OT/SLP Co-Evaluation/Treatment: Yes Reason for Co-Treatment: For patient/therapist safety;To address functional/ADL transfers PT goals addressed during session: Mobility/safety with mobility OT goals addressed during session: ADL's and self-care      AM-PAC PT "6 Clicks" Mobility   Outcome Measure  Help needed turning from your back to your side while in a flat bed without using bedrails?: Total Help needed moving from lying on your back to sitting on the side of a flat bed without using bedrails?: Total Help needed moving to and from a bed to a chair (including a wheelchair)?: Total Help needed standing up from a chair using your arms (e.g., wheelchair or bedside chair)?: Total Help needed to walk in hospital room?: Total Help needed climbing 3-5 steps with a railing? : Total 6 Click Score: 6    End of Session Equipment Utilized During Treatment: Gait belt Activity Tolerance: Patient tolerated treatment well Patient left: in bed;with nursing/sitter in room   PT Visit Diagnosis: History of falling (Z91.81);Muscle weakness (generalized) (M62.81);Difficulty in walking, not elsewhere classified (R26.2)     Time: 1540-1610 PT Time Calculation (min) (ACUTE ONLY): 30 min  Charges:  $Therapeutic Activity: 8-22 mins                    Jannette Spanner PT, DPT Acute Rehabilitation Services Pager: [phone] Office: [place] 11/15/2021, 4:50 PM

## 2021-11-15 NOTE — ED Provider Notes (Signed)
76 year old male who has been in the emergency department for about a week.  Concern for deconditioning.  Transition of care was working for placement.  However over his stay in the ER patient had a significant decline.  Has become altered, refusing to p.o., weak and unable to walk with assistance.  Repeat lab evaluation shows uptrending CK and a urinalysis with worsening leukocytes, concerning for UTI.  In light of this and inability to p.o./ambulate or mentate appropriately.  Plan for medical admission for IV hydration, treatment of UTI and PT.  Patients evaluation and results requires admission for further treatment and care.  Spoke with hospitalist Dr. Olevia Bowens, reviewed patient's ED course and they accept admission.  Patient agrees with admission plan, offers no new complaints and is stable/unchanged at time of admit.   Lorelle Gibbs, DO 11/15/21 1728

## 2021-11-15 NOTE — ED Notes (Signed)
Patient resting comfortably

## 2021-11-16 DIAGNOSIS — G9341 Metabolic encephalopathy: Secondary | ICD-10-CM | POA: Diagnosis not present

## 2021-11-16 LAB — CBC
HCT: 38.3 % — ABNORMAL LOW (ref 39.0–52.0)
Hemoglobin: 12 g/dL — ABNORMAL LOW (ref 13.0–17.0)
MCH: 21.7 pg — ABNORMAL LOW (ref 26.0–34.0)
MCHC: 31.3 g/dL (ref 30.0–36.0)
MCV: 69.4 fL — ABNORMAL LOW (ref 80.0–100.0)
Platelets: 105 10*3/uL — ABNORMAL LOW (ref 150–400)
RBC: 5.52 MIL/uL (ref 4.22–5.81)
RDW: 14.4 % (ref 11.5–15.5)
WBC: 7.2 10*3/uL (ref 4.0–10.5)
nRBC: 0 % (ref 0.0–0.2)

## 2021-11-16 LAB — GLUCOSE, CAPILLARY
Glucose-Capillary: 119 mg/dL — ABNORMAL HIGH (ref 70–99)
Glucose-Capillary: 127 mg/dL — ABNORMAL HIGH (ref 70–99)
Glucose-Capillary: 138 mg/dL — ABNORMAL HIGH (ref 70–99)
Glucose-Capillary: 102 mg/dL — ABNORMAL HIGH (ref 70–99)

## 2021-11-16 LAB — COMPREHENSIVE METABOLIC PANEL
ALT: 25 U/L (ref 0–44)
AST: 109 U/L — ABNORMAL HIGH (ref 15–41)
Albumin: 3.3 g/dL — ABNORMAL LOW (ref 3.5–5.0)
Alkaline Phosphatase: 181 U/L — ABNORMAL HIGH (ref 38–126)
Anion gap: 9 (ref 5–15)
BUN: 21 mg/dL (ref 8–23)
CO2: 26 mmol/L (ref 22–32)
Calcium: 9.1 mg/dL (ref 8.9–10.3)
Chloride: 99 mmol/L (ref 98–111)
Creatinine, Ser: 0.91 mg/dL (ref 0.61–1.24)
GFR, Estimated: >60 mL/min (ref >60)
Glucose, Bld: 122 mg/dL — ABNORMAL HIGH (ref 70–99)
Potassium: 4.4 mmol/L (ref 3.5–5.1)
Sodium: 134 mmol/L — ABNORMAL LOW (ref 135–145)
Total Bilirubin: 0.6 mg/dL (ref 0.3–1.2)
Total Protein: 7.2 g/dL (ref 6.5–8.1)

## 2021-11-16 LAB — CK: Total CK: 1030 U/L — ABNORMAL HIGH (ref 49–397)

## 2021-11-16 NOTE — Progress Notes (Signed)
Physical Therapy Treatment Patient Details Name: John Shields MRN: [id number] DOB: 08/30/1945 Today's Date: 11/16/2021   History of Present Illness 76 year old male with PMH significant for PE, metastatic prostate cancer, T2DM presenting to ED with weakness.  Pt with other recent ED visits for weakness and dizziness. Pt unable to get up after a fall.    PT Comments    Pt now admitted and seen in room 1604.  NT giving a bed bath.  General Comments: alert, good eye contact but selective words, mostly non verbal, delayed response to questions/commands, soft voice.  only would repeat words <25% of time.  Does smile and laugh (childlike). Assisted OOB required + 2 assist. General bed mobility comments: pt rigid throughout and present with Bradykenesia.  Required + 2 Max Assist "Hellicopter" swival on pad.  Static sitting balance is poor present with forward flex head, rounded shoulders and back.  Posterior LOB x 3 times as pt was unable to self achieve/correct to midline posture.  Limited functional use of B UE's to support self.  Pretty much remains in fetal posiiton. General transfer comment: first assisted to [facility] as pt passing flutes.  required + 2 Max/Total Assist to partially stand (B hips/knees flexed) difficult to complete 1/4 pivot.  Sat on BSC with no results.  Asissted off BSC + 2 assist to partial stance using B platform EVA walker. General Gait Details: Used B platform EVA walker and + 2 assit pt with great difficulty with initiation.  MAX VC's to "step Big" and tactile cueing to advance walker.  VERY short, shuffled steps as well as fesination (stop and go).  Pood forward flex posture.  Rigid. VERY limited distance of 7 feet.  Recliner following for safety. HIGH FALL RISK. Positioned in recliner with multiple pillows.   Pt will need ST Rehab at SNF prior to returning home alone.   Recommendations for follow up therapy are one component of a multi-disciplinary discharge planning process, led  by the attending physician.  Recommendations may be updated based on patient status, additional functional criteria and insurance authorization.  Follow Up Recommendations  Skilled nursing-short term rehab (<3 hours/day)     Assistance Recommended at Discharge Frequent or constant Supervision/Assistance  Patient can return home with the following Two people to help with walking and/or transfers;Two people to help with bathing/dressing/bathroom   Equipment Recommendations  None recommended by PT    Recommendations for Other Services       Precautions / Restrictions Precautions Precautions: Fall Precaution Comments: pt has had multiple falls Restrictions Weight Bearing Restrictions: No     Mobility  Bed Mobility Overal bed mobility: Needs Assistance Bed Mobility: Supine to Sit     Supine to sit: Max assist, +2 for physical assistance, +2 for safety/equipment     General bed mobility comments: pt rigid throughout and present with Bradykenesia.  Required + 2 Max Assist "Hellicopter" swival on pad.  Static sitting balance is poor present with forward flex head, rounded shoulders and back.  Posterior LOB x 3 times as pt was unable to self achieve/correct to midline posture.  Limited functional use of B UE's to support self.  Pretty much remains in fetal posiiton.    Transfers Overall transfer level: Needs assistance Equipment used: None Transfers: Sit to/from Stand, Bed to chair/wheelchair/BSC Sit to Stand: Max assist, +2 physical assistance Stand pivot transfers: Max assist, Total assist, +2 physical assistance, +2 safety/equipment         General transfer comment: first assisted to  BSC as pt passing flutes.  required + 2 Max/Total Assist to partially stand (B hips/knees flexed) difficult to complete 1/4 pivot.  Sat on BSC with no results.  Asissted off BSC + 2 assist to partial stance using B platform EVA walker.    Ambulation/Gait Ambulation/Gait assistance: Max assist,  +2 physical assistance, +2 safety/equipment Gait Distance (Feet): 7 Feet Assistive device: Bilateral platform walker (EVA walker) Gait Pattern/deviations: Festinating, Narrow base of support, Trunk flexed, Shuffle, Decreased step length - right, Decreased step length - left, Knee flexed in stance - right, Knee flexed in stance - left, Ataxic Gait velocity: decreased     General Gait Details: Used B platform EVA walker and + 2 assit pt with great difficulty with initiation.  MAX VC's to "step Big" and tactile cueing to advance walker.  VERY short, shuffled steps as well as fesination (stop and go).  Pood forward flex posture.  Rigid. VERY limited distance of 7 feet.  Recliner following for safety.   Stairs             Wheelchair Mobility    Modified Rankin (Stroke Patients Only)       Balance                                            Cognition Arousal/Alertness: Awake/alert Behavior During Therapy: Flat affect Overall Cognitive Status: No family/caregiver present to determine baseline cognitive functioning                                 General Comments: alert, good eye contact but selective words, mostly non verbal, delayed responce to questions/commands, soft voice.  only would repeat words <25% of time.  Does smile and laugh (childlike).        Exercises      General Comments        Pertinent Vitals/Pain Pain Assessment Pain Assessment: Faces Faces Pain Scale: Hurts a little bit Pain Location: generalized Pain Descriptors / Indicators: Tightness Pain Intervention(s): Monitored during session, Repositioned    Home Living                          Prior Function            PT Goals (current goals can now be found in the care plan section) Progress towards PT goals: Progressing toward goals    Frequency    Min 2X/week      PT Plan Current plan remains appropriate    Co-evaluation               AM-PAC PT "6 Clicks" Mobility   Outcome Measure  Help needed turning from your back to your side while in a flat bed without using bedrails?: A Lot Help needed moving from lying on your back to sitting on the side of a flat bed without using bedrails?: A Lot Help needed moving to and from a bed to a chair (including a wheelchair)?: A Lot Help needed standing up from a chair using your arms (e.g., wheelchair or bedside chair)?: A Lot Help needed to walk in hospital room?: A Lot Help needed climbing 3-5 steps with a railing? : Total 6 Click Score: 11    End of Session Equipment Utilized During Treatment: Gait belt Activity Tolerance:  Patient tolerated treatment well Patient left: in chair;with call bell/phone within reach;with chair alarm set Nurse Communication: Mobility status PT Visit Diagnosis: History of falling (Z91.81);Muscle weakness (generalized) (M62.81);Difficulty in walking, not elsewhere classified (R26.2)     Time: 2952-8413 PT Time Calculation (min) (ACUTE ONLY): 33 min  Charges:  $Gait Training: 8-22 mins $Therapeutic Activity: 8-22 mins                    Rica Koyanagi  PTA Acute  Rehabilitation Services Office M-F          [phone] Weekend pager [phone]

## 2021-11-16 NOTE — Progress Notes (Signed)
PROGRESS NOTE    John Shields  HKV:[id number] DOB: 1945-06-24 DOA: 11/09/2021 PCP: [facility]    Brief Narrative:  76 year old with type 2 diabetes, essential hypertension, coronary artery disease, chronic left bundle branch block, prostate cancer, history of stroke with left-sided weakness and vascular dementia presented to the ER with fall at home and pursued placement from emergency room, however after 6 days is staying in the ER found to be more deconditioned and less interactive so admitted to the hospital.  Afebrile.  Blood pressure stable.  On room air.  Urinalysis with cloudy urine.  CK19 07.  Chest x-ray normal.  MRI brain from 6/15 with no acute intracranial abnormality.   Assessment & Plan:   Acute metabolic infective encephalopathy in a patient with known vascular dementia Severe deconditioning and debility, generalized weakness  Nonfocal exam.  MRI 6/15 without any acute findings.  Electrolytes are fairly normal.  Fall precautions.  Delirium precautions.  PT OT and SNF placement.  Acute UTI suspected on admission: Previous history of E. coli UTIs.  Currently remains on Rocephin.  Will check postvoid residual urine.  Renal functions are stable.  Essential hypertension: On amlodipine, hydralazine, metoprolol and hydrochlorothiazide.  Blood pressures acceptable.  Traumatic rhabdomyolysis: Already improving on IV fluids.  Urine output is adequate.  Continue maintenance IV fluids today.  Monitor today on IV antibiotics and IV fluids.  Anticipate discharge readiness by tomorrow.   DVT prophylaxis: enoxaparin (LOVENOX) injection 40 mg Start: 11/15/21 2200   Code Status: Full code Family Communication: None Disposition Plan: Status is: Inpatient Remains inpatient appropriate because: Unsafe discharge     Consultants:  None  Procedures:  None  Antimicrobials:  Rocephin 6/20---   Subjective: Patient was seen and examined.  No overnight events.   Afebrile.  Poor historian.  Patient tells me that he came to emergency room because he could not take care of himself at home.  Denies any nausea vomiting or abdominal pain.  Denies any difficulty urinating.  Objective: Vitals:   11/15/21 2130 11/16/21 0154 11/16/21 0544 11/16/21 1022  BP: 128/68 (!) 118/103 (!) 145/83 126/76  Pulse: 80 92 95 95  Resp: '18 16 16 15  '$ Temp: 98 F (36.7 C) 99.1 F (37.3 C) 98.4 F (36.9 C) 98.4 F (36.9 C)  TempSrc: Oral Oral Oral Oral  SpO2: 96% 95% 94% 92%  Weight:      Height:        Intake/Output Summary (Last 24 hours) at 11/16/2021 1126 Last data filed at 11/16/2021 0600 Gross per 24 hour  Intake 1764.16 ml  Output 1550 ml  Net 214.16 ml   Filed Weights   11/09/21 1645  Weight: 105.7 kg    Examination:  General exam: Appears calm and comfortable at rest. Alert and awake.  Oriented x2-3.  Mostly quiet and comfortable. Chronically sick looking.  Frail and debilitated.  Flat affect. Respiratory system: Clear to auscultation. Respiratory effort normal. Cardiovascular system: S1 & S2 heard, RRR. No pedal edema. Gastrointestinal system: Abdomen is nondistended, soft and nontender. No organomegaly or masses felt. Normal bowel sounds heard. Extremities: Generalized weakness.  Left more than right.    Data Reviewed: I have personally reviewed following labs and imaging studies  CBC: Recent Labs  Lab 11/15/21 1145 11/16/21 0705  WBC 7.3 7.2  NEUTROABS 4.5  --   HGB 13.3 12.0*  HCT 42.5 38.3*  MCV 69.1* 69.4*  PLT 119* 564*   Basic Metabolic Panel: Recent Labs  Lab 11/15/21 1145  11/16/21 0705  NA 135 134*  K 4.8 4.4  CL 99 99  CO2 26 26  GLUCOSE 124* 122*  BUN 23 21  CREATININE 0.92 0.91  CALCIUM 9.4 9.1   GFR: Estimated Creatinine Clearance: 88.1 mL/min (by C-G formula based on SCr of 0.91 mg/dL). Liver Function Tests: Recent Labs  Lab 11/16/21 0705  AST 109*  ALT 25  ALKPHOS 181*  BILITOT 0.6  PROT 7.2  ALBUMIN  3.3*   No results for input(s): "LIPASE", "AMYLASE" in the last 168 hours. No results for input(s): "AMMONIA" in the last 168 hours. Coagulation Profile: No results for input(s): "INR", "PROTIME" in the last 168 hours. Cardiac Enzymes: Recent Labs  Lab 11/15/21 1145 11/16/21 0705  CKTOTAL 1,907* 1,030*   BNP (last 3 results) No results for input(s): "PROBNP" in the last 8760 hours. HbA1C: No results for input(s): "HGBA1C" in the last 72 hours. CBG: Recent Labs  Lab 11/15/21 2308 11/16/21 0745  GLUCAP 114* 119*   Lipid Profile: No results for input(s): "CHOL", "HDL", "LDLCALC", "TRIG", "CHOLHDL", "LDLDIRECT" in the last 72 hours. Thyroid Function Tests: No results for input(s): "TSH", "T4TOTAL", "FREET4", "T3FREE", "THYROIDAB" in the last 72 hours. Anemia Panel: No results for input(s): "VITAMINB12", "FOLATE", "FERRITIN", "TIBC", "IRON", "RETICCTPCT" in the last 72 hours. Sepsis Labs: No results for input(s): "PROCALCITON", "LATICACIDVEN" in the last 168 hours.  No results found for this or any previous visit (from the past 240 hour(s)).       Radiology Studies: No results found.      Scheduled Meds:  amLODipine  2.5 mg Oral Daily   aspirin EC  650 mg Oral Daily   diclofenac Sodium  4 g Topical QID   enoxaparin (LOVENOX) injection  40 mg Subcutaneous Q24H   hydrALAZINE  25 mg Oral Daily   hydrochlorothiazide  25 mg Oral Daily   lidocaine  1 patch Transdermal Q24H   metoprolol tartrate  25 mg Oral Daily   multivitamin with minerals  1 tablet Oral Daily   Continuous Infusions:  cefTRIAXone (ROCEPHIN)  IV 2 g (11/16/21 1027)   lactated ringers 100 mL/hr at 11/16/21 0325     LOS: 1 day    Time spent: 35 minutes    Barb Merino, MD [facility] Pager [phone]

## 2021-11-16 NOTE — TOC Initial Note (Signed)
Transition of Care [facility]) - Initial/Assessment Note    Patient Details  Name: John Shields MRN: [id number] Date of Birth: [birth year]  [facility]) CM/SW Contact:    John Courts, RN Phone Number: 11/16/2021, 3:20 PM  Clinical Narrative:                 Cm received call back from patient's son who was contacted on Friday of last week while patient was in emergency department.  Son reports he lives in [place] and is not in contact with his dad, reports last saw patient 5 years ago.  Son reports that he is unfamiliar with his dad's finances and would not be a good source of information in helping to navigate long term placement.  Son reports he has spoken with Mr John Shields with APS and is under the impression that APS will be stepping in on his dad's behalf.  Son also reports that patient has 8 living siblings, none of whom are in the area and son has no contact for any of these individuals, states he has told this to APS as well.  Per son best source of information is APS and patient's ex John Shields who has agreed to help with patient's situation.  This CM re-confirmed that son does NOT wish to be directly involved in care planning for the patient and is deferring all decisions to APS and John Shields.  TOC will continue to assist with post acute placement.   Expected Discharge Plan: Skilled Nursing Facility Barriers to Discharge: Continued Medical Work up   Patient Goals and CMS Choice Patient states their goals for this hospitalization and ongoing recovery are:: to go to a facility per APS/ ex wife/ son      Expected Discharge Plan and Services Expected Discharge Plan: [place]   Discharge Planning Services: CM Consult   Living arrangements for the past 2 months: Apartment                                      Prior Living Arrangements/Services Living arrangements for the past 2 months: Apartment Lives with:: Self Patient language and need for  interpreter reviewed:: Yes Do you feel safe going back to the place where you live?: No   unsafe to live alone  Need for Family Participation in Patient Care: Yes (Comment) Care giver support system in place?: No (comment)   Criminal Activity/Legal Involvement Pertinent to Current Situation/Hospitalization: No - Comment as needed  Activities of Daily Living Home Assistive Devices/Equipment: Eyeglasses ADL Screening (condition at time of admission) Patient's cognitive ability adequate to safely complete daily activities?: No Is the patient deaf or have difficulty hearing?: No Does the patient have difficulty seeing, even when wearing glasses/contacts?: No Does the patient have difficulty concentrating, remembering, or making decisions?: Yes Patient able to express need for assistance with ADLs?: Yes Does the patient have difficulty dressing or bathing?: Yes Independently performs ADLs?: No Communication: Independent Dressing (OT): Needs assistance Grooming: Needs assistance Feeding: Needs assistance Bathing: Needs assistance Toileting: Needs assistance In/Out Bed: Needs assistance Does the patient have difficulty walking or climbing stairs?: Yes Weakness of Legs: Both Weakness of Arms/Hands: None  Permission Sought/Granted                  Emotional Assessment Appearance:: Appears stated age     Orientation: : Fluctuating Orientation (Suspected and/or reported Sundowners)   Psych  Involvement: No (comment)  Admission diagnosis:  SOB (shortness of breath) [R06.02] Elevated CK [R74.8] Acute UTI [N39.0] Failure to thrive in adult [R62.7] Non-traumatic rhabdomyolysis [Z79.15] Acute metabolic encephalopathy [A56.97] Patient Active Problem List   Diagnosis Date Noted   Acute metabolic encephalopathy 94/80/1655   Rhabdomyolysis 06/10/2021   Dehydration 06/10/2021   Generalized weakness 06/10/2021   Acute prerenal azotemia 06/10/2021   Acute pulmonary embolism ([place])  04/05/2019   Gross hematuria 04/05/2019   Diabetes mellitus type II, non insulin dependent ([place]) 04/05/2019   Leg hematoma, left, initial encounter 04/05/2019   Hematoma of left lower extremity    Dizziness 03/21/2019   History of CVA (cerebrovascular accident)    History of aneurysm    Dyslipidemia    Acute left-sided weakness 09/01/2017   Acute lower UTI 09/01/2017   Cerebral thrombosis with cerebral infarction 09/01/2017   Near syncope 07/25/2017   Prostate cancer, primary, 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 [facility])    Left bundle branch block (LBBB) on electrocardiogram    H/O bilateral orchiectomies    Dyspnea on exertion    BPH (benign prostatic hyperplasia) 12/27/2016   Renal mass 12/27/2016   Thrombocytopenia ([place]) 12/27/2016   Sepsis ([place]) 12/27/2016   Elevated LFTs    Lactic acidosis    AKI (acute kidney injury) ([place])    Urinary retention 06/07/2016   Constipation 06/07/2016   Acute renal failure (ARF) ([place]) 06/07/2016   Essential hypertension 06/07/2016   Hyperglycemia 06/07/2016   PCP:  [facility] [place]:   [place], [place] [address] [place] [address] Phone: [phone] Fax: [phone]  [facility] [address] [place] [address] Phone: [phone] Fax: [phone]     Social Determinants of Health (SDOH) Interventions    Readmission Risk Interventions     No data to display

## 2021-11-16 NOTE — Plan of Care (Signed)
  Problem: Clinical Measurements: Goal: Ability to maintain clinical measurements within normal limits will improve Outcome: Progressing   

## 2021-11-16 NOTE — NC FL2 (Signed)
[place] LEVEL OF CARE SCREENING TOOL     IDENTIFICATION  Patient Name: John Shields Birthdate: 06-22-1945 Sex: male Admission Date (Current Location): 11/09/2021  [facility] and [place] Number:  Herbalist and Address:  [facility],  [place], [place]      Provider Number: [id number]  Attending Physician Name and Address:  Barb Merino, MD  Relative Name and Phone Number:       Current Level of Care: Hospital Recommended Level of Care: [place] Prior Approval Number:    Date Approved/Denied:   PASRR Number: [id number]  Discharge Plan: SNF    Current Diagnoses: Patient Active Problem List   Diagnosis Date Noted   Acute metabolic encephalopathy 47/82/9562   Rhabdomyolysis 06/10/2021   Dehydration 06/10/2021   Generalized weakness 06/10/2021   Acute prerenal azotemia 06/10/2021   Acute pulmonary embolism ([place]) 04/05/2019   Gross hematuria 04/05/2019   Diabetes mellitus type II, non insulin dependent ([place]) 04/05/2019   Leg hematoma, left, initial encounter 04/05/2019   Hematoma of left lower extremity    Dizziness 03/21/2019   History of CVA (cerebrovascular accident)    History of aneurysm    Dyslipidemia    Acute left-sided weakness 09/01/2017   Acute lower UTI 09/01/2017   Cerebral thrombosis with cerebral infarction 09/01/2017   Near syncope 07/25/2017   Prostate cancer, primary, 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 [facility])    Left bundle branch block (LBBB) on electrocardiogram    H/O bilateral orchiectomies    Dyspnea on exertion    BPH (benign prostatic hyperplasia) 12/27/2016   Renal mass 12/27/2016   Thrombocytopenia ([place]) 12/27/2016   Sepsis ([place]) 12/27/2016   Elevated LFTs    Lactic acidosis    AKI (acute kidney injury) ([place])    Urinary retention 06/07/2016   Constipation 06/07/2016   Acute renal failure (ARF) ([place]) 06/07/2016   Essential hypertension 06/07/2016    Hyperglycemia 06/07/2016    Orientation RESPIRATION BLADDER Height & Weight     Self, Place  Normal Continent Weight: 105.7 kg Height:  6' (182.9 cm)  BEHAVIORAL SYMPTOMS/MOOD NEUROLOGICAL BOWEL NUTRITION STATUS      Continent Diet  AMBULATORY STATUS COMMUNICATION OF NEEDS Skin   Extensive Assist Verbally Normal                       Personal Care Assistance Level of Assistance  Bathing, Feeding, Dressing, Total care Bathing Assistance: Limited assistance Feeding assistance: Independent Dressing Assistance: Limited assistance Total Care Assistance: Maximum assistance   Functional Limitations Info  Sight, Hearing, Speech Sight Info: Adequate Hearing Info: Adequate Speech Info: Adequate    SPECIAL CARE FACTORS FREQUENCY  PT (By licensed PT), OT (By licensed OT)     PT Frequency: 5x weekly OT Frequency: 5x weekly            Contractures Contractures Info: Not present    Additional Factors Info  Code Status, Allergies Code Status Info: Full Allergies Info: NKDA           Current Medications (11/16/2021):  This is the current hospital active medication list Current Facility-Administered Medications  Medication Dose Route Frequency Provider Last Rate Last Admin   acetaminophen (TYLENOL) tablet 650 mg  650 mg Oral Q6H PRN Reubin Milan, MD       Or   acetaminophen (TYLENOL) suppository 650 mg  650 mg Rectal Q6H PRN Reubin Milan, MD  albuterol (PROVENTIL) (2.5 MG/3ML) 0.083% nebulizer solution 2.5 mg  2.5 mg Nebulization Q2H PRN Reubin Milan, MD       amLODipine (NORVASC) tablet 2.5 mg  2.5 mg Oral Daily Reubin Milan, MD   2.5 mg at 11/16/21 1027   aspirin EC tablet 650 mg  650 mg Oral Daily Reubin Milan, MD   650 mg at 11/16/21 1028   cefTRIAXone (ROCEPHIN) 2 g in sodium chloride 0.9 % 100 mL IVPB  2 g Intravenous Q24H Reubin Milan, MD 200 mL/hr at 11/16/21 1027 2 g at 11/16/21 1027   diclofenac Sodium (VOLTAREN) 1  % topical gel 4 g  4 g Topical QID Reubin Milan, MD   4 g at 11/15/21 1723   enoxaparin (LOVENOX) injection 40 mg  40 mg Subcutaneous Q24H Reubin Milan, MD   40 mg at 11/15/21 2253   hydrALAZINE (APRESOLINE) tablet 25 mg  25 mg Oral Daily Reubin Milan, MD   25 mg at 11/16/21 1028   hydrochlorothiazide (HYDRODIURIL) tablet 25 mg  25 mg Oral Daily Reubin Milan, MD   25 mg at 11/16/21 1028   lactated ringers infusion   Intravenous Continuous Reubin Milan, MD 100 mL/hr at 11/16/21 0325 New Bag at 11/16/21 0325   lidocaine (LIDODERM) 5 % 1 patch  1 patch Transdermal Q24H Reubin Milan, MD   1 patch at 11/16/21 0306   LORazepam (ATIVAN) injection 0.5 mg  0.5 mg Intravenous Once PRN Reubin Milan, MD       metoprolol tartrate (LOPRESSOR) tablet 25 mg  25 mg Oral Daily Reubin Milan, MD   25 mg at 11/16/21 1028   morphine (MSIR) tablet 7.5 mg  7.5 mg Oral Q12H PRN Reubin Milan, MD       multivitamin with minerals tablet 1 tablet  1 tablet Oral Daily Reubin Milan, MD   1 tablet at 11/16/21 1028   ondansetron [facility]) tablet 4 mg  4 mg Oral Q6H PRN Reubin Milan, MD       Or   ondansetron [facility]) injection 4 mg  4 mg Intravenous Q6H PRN Reubin Milan, MD         Discharge Medications: Please see discharge summary for a list of discharge medications.  Relevant Imaging Results:  Relevant Lab Results:   Additional Information SSN :[id number]  Joaquin Courts, RN

## 2021-11-17 DIAGNOSIS — G9341 Metabolic encephalopathy: Secondary | ICD-10-CM | POA: Diagnosis not present

## 2021-11-17 LAB — URINE CULTURE: Culture: >=100000 — AB

## 2021-11-17 LAB — GLUCOSE, CAPILLARY
Glucose-Capillary: 118 mg/dL — ABNORMAL HIGH (ref 70–99)
Glucose-Capillary: 156 mg/dL — ABNORMAL HIGH (ref 70–99)
Glucose-Capillary: 113 mg/dL — ABNORMAL HIGH (ref 70–99)
Glucose-Capillary: 113 mg/dL — ABNORMAL HIGH (ref 70–99)

## 2021-11-17 LAB — CK: Total CK: 657 U/L — ABNORMAL HIGH (ref 49–397)

## 2021-11-17 MED ORDER — AMOXICILLIN 250 MG PO CAPS
500.0000 mg | ORAL_CAPSULE | Freq: Two times a day (BID) | ORAL | Status: DC
  Administered 2021-11-17 – 2021-11-18 (×2): 500 mg via ORAL
  Filled 2021-11-17 (×2): qty 2

## 2021-11-17 NOTE — Plan of Care (Signed)
  Problem: Health Behavior/Discharge Planning: Goal: Ability to manage health-related needs will improve Outcome: Progressing   

## 2021-11-17 NOTE — Progress Notes (Signed)
PROGRESS NOTE    John Shields  WCB:[id number] DOB: 1945/11/19 DOA: 11/09/2021 PCP: [facility]    Brief Narrative:  76 year old with type 2 diabetes, essential hypertension, coronary artery disease, chronic left bundle branch block, prostate cancer, history of stroke with left-sided weakness and vascular dementia presented to the ER with fall at home and pursued placement from emergency room, however after 6 days is staying in the ER found to be more deconditioned and less interactive so admitted to the hospital.  Afebrile.  Blood pressure stable.  On room air.  Urinalysis with cloudy urine.  CK19 07.  Chest x-ray normal.  MRI brain from 6/15 with no acute intracranial abnormality.   Assessment & Plan:   Acute metabolic infective encephalopathy in a patient with known vascular dementia Severe deconditioning and debility, generalized weakness  Nonfocal exam.  MRI 6/15 without any acute findings.  Electrolytes are fairly normal.  Fall precautions.  Delirium precautions.  PT OT and SNF placement.  Mental status much improved today.  Acute UTI suspected on admission: Previous history of E. coli UTIs.  Currently remains on Rocephin.  No retention of urine. Renal functions are stable. Urine culture positive for Proteus sensitive to Rocephin.  We will continue IV Rocephin while in the hospital, on discharge will treat as complicated UTI with 10 days of cephalosporins.  Essential hypertension: On amlodipine, hydralazine, metoprolol and hydrochlorothiazide.  Blood pressures acceptable.  Traumatic rhabdomyolysis: Already improving on IV fluids.  Urine output is adequate.  Discontinue fluid.  Encourage oral intake.    Previous stroke, deconditioning and rigidity: Probably has chronic neurological issues.  Currently stable.  Will need long-term neurology follow-up.  We will send to neurology consult as outpatient on discharge.  He will first need rehab.   DVT prophylaxis: enoxaparin  (LOVENOX) injection 40 mg Start: 11/15/21 2200   Code Status: Full code Family Communication: None Disposition Plan: Status is: Inpatient Remains inpatient appropriate because: Waiting for SNF bed availability.     Consultants:  None  Procedures:  None  Antimicrobials:  Rocephin 6/20---   Subjective:  Seen and examined.  More awake.  Interactive.  Slow to respond, however able to speak in full sentences.  Mostly alert and oriented x2-3.  Denies any complaints today.  He agrees that he needs some rehab.  Objective: Vitals:   11/16/21 1022 11/16/21 1607 11/16/21 2115 11/17/21 0506  BP: 126/76 125/86 133/78 (!) 156/93  Pulse: 95 100 83 99  Resp: '15 20 14 16  '$ Temp: 98.4 F (36.9 C) 99.5 F (37.5 C) 98.1 F (36.7 C) 97.8 F (36.6 C)  TempSrc: Oral Oral Oral Oral  SpO2: 92% 97% 97% 97%  Weight:      Height:        Intake/Output Summary (Last 24 hours) at 11/17/2021 0942 Last data filed at 11/17/2021 0935 Gross per 24 hour  Intake 1833.33 ml  Output 2050 ml  Net -216.67 ml   Filed Weights   11/09/21 1645  Weight: 105.7 kg    Examination:  General: Frail and debilitated.  Not in any distress.  On room air. Cardiovascular: S1-S2 normal.  Regular rate rhythm. Respiratory: Bilateral clear.  No added sounds. Gastrointestinal: Soft.  Nontender.  Bowel sound present.  Pure wick with clear urine. Ext: No deformities.  No edema or cyanosis. Neuro: Alert oriented x2-3.  Slow to respond.  Bradykinesia.  No focal deficits. Musculoskeletal: No deformities.      Data Reviewed: I have personally reviewed following labs and  imaging studies  CBC: Recent Labs  Lab 11/15/21 1145 11/16/21 0705  WBC 7.3 7.2  NEUTROABS 4.5  --   HGB 13.3 12.0*  HCT 42.5 38.3*  MCV 69.1* 69.4*  PLT 119* 696*   Basic Metabolic Panel: Recent Labs  Lab 11/15/21 1145 11/16/21 0705  NA 135 134*  K 4.8 4.4  CL 99 99  CO2 26 26  GLUCOSE 124* 122*  BUN 23 21  CREATININE 0.92 0.91   CALCIUM 9.4 9.1   GFR: Estimated Creatinine Clearance: 88.1 mL/min (by C-G formula based on SCr of 0.91 mg/dL). Liver Function Tests: Recent Labs  Lab 11/16/21 0705  AST 109*  ALT 25  ALKPHOS 181*  BILITOT 0.6  PROT 7.2  ALBUMIN 3.3*   No results for input(s): "LIPASE", "AMYLASE" in the last 168 hours. No results for input(s): "AMMONIA" in the last 168 hours. Coagulation Profile: No results for input(s): "INR", "PROTIME" in the last 168 hours. Cardiac Enzymes: Recent Labs  Lab 11/15/21 1145 11/16/21 0705 11/17/21 0552  CKTOTAL 1,907* 1,030* 657*   BNP (last 3 results) No results for input(s): "PROBNP" in the last 8760 hours. HbA1C: No results for input(s): "HGBA1C" in the last 72 hours. CBG: Recent Labs  Lab 11/16/21 0745 11/16/21 1212 11/16/21 1702 11/16/21 2119 11/17/21 0759  GLUCAP 119* 127* 138* 102* 118*   Lipid Profile: No results for input(s): "CHOL", "HDL", "LDLCALC", "TRIG", "CHOLHDL", "LDLDIRECT" in the last 72 hours. Thyroid Function Tests: No results for input(s): "TSH", "T4TOTAL", "FREET4", "T3FREE", "THYROIDAB" in the last 72 hours. Anemia Panel: No results for input(s): "VITAMINB12", "FOLATE", "FERRITIN", "TIBC", "IRON", "RETICCTPCT" in the last 72 hours. Sepsis Labs: No results for input(s): "PROCALCITON", "LATICACIDVEN" in the last 168 hours.  Recent Results (from the past 240 hour(s))  Urine Culture     Status: Abnormal   Collection Time: 11/15/21  2:02 PM   Specimen: In/Out Cath Urine  Result Value Ref Range Status   Specimen Description   Final    IN/OUT CATH URINE Performed at [facility] [address], [place], [place] [address]    Special Requests   Final    NONE Performed at [facility], [place] [address], [place], [place] [address]    Culture >=100,000 COLONIES/mL PROTEUS MIRABILIS (A)  Final   Report Status 11/17/2021 FINAL  Final   Organism ID, Bacteria PROTEUS MIRABILIS (A)  Final       Susceptibility   Proteus mirabilis - MIC*    AMPICILLIN <=2 SENSITIVE Sensitive     CEFAZOLIN <=4 SENSITIVE Sensitive     CEFEPIME <=0.12 SENSITIVE Sensitive     CEFTRIAXONE <=0.25 SENSITIVE Sensitive     CIPROFLOXACIN <=0.25 SENSITIVE Sensitive     GENTAMICIN <=1 SENSITIVE Sensitive     IMIPENEM 2 SENSITIVE Sensitive     NITROFURANTOIN 128 RESISTANT Resistant     TRIMETH/SULFA <=20 SENSITIVE Sensitive     AMPICILLIN/SULBACTAM <=2 SENSITIVE Sensitive     PIP/TAZO <=4 SENSITIVE Sensitive     * >=100,000 COLONIES/mL PROTEUS MIRABILIS         Radiology Studies: No results found.      Scheduled Meds:  amLODipine  2.5 mg Oral Daily   aspirin EC  650 mg Oral Daily   diclofenac Sodium  4 g Topical QID   enoxaparin (LOVENOX) injection  40 mg Subcutaneous Q24H   hydrALAZINE  25 mg Oral Daily   hydrochlorothiazide  25 mg Oral Daily   lidocaine  1 patch Transdermal  Q24H   metoprolol tartrate  25 mg Oral Daily   multivitamin with minerals  1 tablet Oral Daily   Continuous Infusions:  cefTRIAXone (ROCEPHIN)  IV 2 g (11/16/21 1027)     LOS: 2 days    Time spent: 35 minutes    Barb Merino, MD [facility] Pager [phone]

## 2021-11-17 NOTE — Progress Notes (Signed)
Nutrition Brief Note  Patient identified on the Malnutrition Screening Tool (MST) Report  Spoke with patient will pt was eating lunch of pot roast, carrots, mashed potatoes and peaches. Pt eating well. Reports no issues with appetite. Denies weight loss.   Wt Readings from Last 15 Encounters:  11/09/21 105.7 kg  11/08/21 106 kg  10/08/21 106 kg  06/12/21 106 kg  04/04/19 104.3 kg  03/22/19 95.3 kg  09/01/17 98.8 kg  08/05/17 93 kg  07/25/17 93 kg  01/31/17 90.7 kg  12/27/16 95.9 kg  06/07/16 95 kg  05/30/16 83.9 kg  12/02/14 95.3 kg  11/30/14 95.3 kg    Body mass index is 31.6 kg/m. Patient meets criteria for obesity based on current BMI.   Current diet order is Heart healthy/ CHO modified, patient is consuming approximately 100% of meals at this time. Labs and medications reviewed.   No nutrition interventions warranted at this time. If nutrition issues arise, please consult RD.   Clayton Bibles, MS, RD, LDN Inpatient Clinical Dietitian Contact information available via Amion

## 2021-11-17 NOTE — Progress Notes (Signed)
Physical Therapy Treatment Patient Details Name: John Shields MRN: [id number] DOB: [birth year] Today's Date: 11/17/2021   History of Present Illness 76 year old male with PMH significant for PE, metastatic prostate cancer, T2DM presenting to ED with weakness.  Pt with other recent ED visits for weakness and dizziness. Pt unable to get up after a fall.    PT Comments    General Comments: increased alertness and more vocal.  Following all commands.  Pleasant.General bed mobility comments: OOB in recliner by NT [facility].  Using STEDY.  NT reported, pt was able to pull himself up with asisst. Assisted with amb.  Ambulation/Gait assistance: Max assist, +2 physical assistance, +2 safety/equipment, Total assist.  General transfer comment: Pt still required + 2 asisst to partially rise.  Very slow and rigid.  Posterior lean LOB.  Greater difficulty completing turns with inability to safely weight shift and advance either LE.  B hips and knees flexed.  Narrow BOS.  Poor self correction with any balance loss.  HIGH FALL RISK.  Assisted to [facility] twice per pt request for a BM but both times only flutes.   Prior to admits, pt was living home alone and amb without an AD.  SIGNIFICANT DECLINE.  Pt present with impairment of Neuromuscular Functions including gait ataxia, festination, rigidity and decreased functional use of his upper extremities.    Pt will need ST Rehab at SNF to address his decline in functional mobility.        Recommendations for follow up therapy are one component of a multi-disciplinary discharge planning process, led by the attending physician.  Recommendations may be updated based on patient status, additional functional criteria and insurance authorization.  Follow Up Recommendations  Skilled nursing-short term rehab (<3 hours/day) Can patient physically be transported by private vehicle: No   Assistance Recommended at Discharge Frequent or constant Supervision/Assistance  Patient can  return home with the following Two people to help with walking and/or transfers;Two people to help with bathing/dressing/bathroom   Equipment Recommendations  None recommended by PT    Recommendations for Other Services       Precautions / Restrictions Precautions Precautions: Fall Precaution Comments: pt has had multiple falls Restrictions Weight Bearing Restrictions: No     Mobility  Bed Mobility               General bed mobility comments: OOB in recliner by NT [facility].  Using STEDY.  NT reported, pt was able to pull himself up with asisst.    Transfers Overall transfer level: Needs assistance Equipment used: None, Rolling walker (2 wheels) Transfers: Sit to/from Stand, Bed to chair/wheelchair/BSC Sit to Stand: Max assist, +2 physical assistance Stand pivot transfers: Max assist, Total assist, +2 physical assistance, +2 safety/equipment         General transfer comment: Pt still required + 2 asisst to partially rise.  Very slow and rigid.  Posterior lean LOB.  Greater difficulty completing turns with inability to safely weight shift and advance either LE.  B hips and knees flexed.  Narrow BOS.  Poor self correction with any balance loss.  HIGH FALL RISK.    Ambulation/Gait Ambulation/Gait assistance: Max assist, +2 physical assistance, +2 safety/equipment, Total assist Gait Distance (Feet): 12 Feet Assistive device: Bilateral platform walker, Rolling walker (2 wheels) Gait Pattern/deviations: Festinating, Narrow base of support, Trunk flexed, Shuffle, Decreased step length - right, Decreased step length - left, Knee flexed in stance - right, Knee flexed in stance - left, Ataxic Gait velocity: decreased  General Gait Details: first attempted with walker however pt was unable to support himself despite + 2 side by side assist.  Then, used a B platform EVA walker pt still required MAX Assist with great difficulty initiating steppage, remains rigid and poor B hip/knee  flex.  VERY short, shuffled steps with stop and go gait (festination).  Delayed and slow motor control.  Slight improvement from yesterday, however VERY limiting. Pt struggled to amb 12 feet.   Stairs             Wheelchair Mobility    Modified Rankin (Stroke Patients Only)       Balance                                            Cognition Arousal/Alertness: Awake/alert   Overall Cognitive Status: Within Functional Limits for tasks assessed                                 General Comments: increased alertness and more vocal.  Following all commands.  Pleasat.        Exercises      General Comments        Pertinent Vitals/Pain Pain Assessment Pain Assessment: No/denies pain    Home Living                          Prior Function            PT Goals (current goals can now be found in the care plan section) Progress towards PT goals: Progressing toward goals    Frequency    Min 2X/week      PT Plan Current plan remains appropriate    Co-evaluation              AM-PAC PT "6 Clicks" Mobility   Outcome Measure  Help needed turning from your back to your side while in a flat bed without using bedrails?: A Lot Help needed moving from lying on your back to sitting on the side of a flat bed without using bedrails?: A Lot Help needed moving to and from a bed to a chair (including a wheelchair)?: A Lot Help needed standing up from a chair using your arms (e.g., wheelchair or bedside chair)?: A Lot Help needed to walk in hospital room?: A Lot Help needed climbing 3-5 steps with a railing? : Total 6 Click Score: 11    End of Session Equipment Utilized During Treatment: Gait belt Activity Tolerance: Other (comment) Patient left: in chair;with call bell/phone within reach;with chair alarm set Nurse Communication: Mobility status PT Visit Diagnosis: History of falling (Z91.81);Muscle weakness (generalized)  (M62.81);Difficulty in walking, not elsewhere classified (R26.2)     Time: 9390-3009 PT Time Calculation (min) (ACUTE ONLY): 28 min  Charges:  $Gait Training: 8-22 mins $Therapeutic Activity: 8-22 mins                     Rica Koyanagi  PTA Acute  Rehabilitation Services Office M-F          [phone] Weekend pager [phone]

## 2021-11-18 DIAGNOSIS — G9341 Metabolic encephalopathy: Secondary | ICD-10-CM | POA: Diagnosis not present

## 2021-11-18 LAB — GLUCOSE, CAPILLARY: Glucose-Capillary: 112 mg/dL — ABNORMAL HIGH (ref 70–99)

## 2021-11-18 MED ORDER — DICLOFENAC SODIUM 1 % EX GEL: 4.0000 g | Freq: Four times a day (QID) | CUTANEOUS | Status: DC

## 2021-11-18 MED ORDER — AMOXICILLIN 500 MG PO CAPS: 500.0000 mg | ORAL_CAPSULE | Freq: Two times a day (BID) | ORAL | 0 refills | Status: AC

## 2021-11-18 MED ORDER — LIDOCAINE 5 % EX PTCH: 1.0000 | MEDICATED_PATCH | CUTANEOUS | 0 refills | Status: DC

## 2022-01-04 ENCOUNTER — Encounter: Payer: Self-pay | Admitting: Diagnostic Neuroimaging

## 2022-01-04 ENCOUNTER — Ambulatory Visit (INDEPENDENT_AMBULATORY_CARE_PROVIDER_SITE_OTHER): Payer: Medicare Other | Admitting: Diagnostic Neuroimaging

## 2022-01-04 VITALS — BP 116/72 | HR 88

## 2022-01-04 DIAGNOSIS — R531 Weakness: Secondary | ICD-10-CM

## 2022-01-04 NOTE — Patient Instructions (Signed)
  PHYSICAL AND COGNITIVE DECLINE (post-stroke; left sided weakness; possible dementia) - continue supportive care; consider palliative care consult

## 2022-01-04 NOTE — Progress Notes (Unsigned)
[facility]  PATIENT: John Shields DOB: [birth year]  REFERRING CLINICIAN: Barb Merino, MD HISTORY FROM: patient  REASON FOR VISIT: new consult    HISTORICAL  CHIEF COMPLAINT:  Chief Complaint  Patient presents with   Bradykinesia    Rm 7 New Pt resides at [facility], [facility]- transporter    HISTORY OF PRESENT ILLNESS:   76 year old male here for evaluation of deconditioning, cognitive decline, stroke.  Patient was living on his own, with multiple ER evaluations due to physical decline and deconditioning.  Ultimately he was admitted to the hospital for placement in June 2023. In the course of admission was found to have metabolic encephalopathy, UTI, generalized weakness.  Since that time patient is stable.  He arrives with nursing home transporter.  Initially he does not speak but gestures.  Upon encouragement and prompting he is able to communicate.    REVIEW OF SYSTEMS: Full 14 system review of systems performed and negative with exception of: as per HPI.  ALLERGIES: No Known Allergies  HOME MEDICATIONS: Outpatient Medications Prior to Visit  Medication Sig Dispense Refill   amLODipine (NORVASC) 2.5 MG tablet Take 2.5 mg by mouth daily.     aspirin 325 MG EC tablet Take 650 mg by mouth daily.     diclofenac Sodium (VOLTAREN) 1 % GEL Apply 4 g topically 4 (four) times daily.     hydrALAZINE (APRESOLINE) 25 MG tablet Take by mouth daily.     hydrochlorothiazide (HYDRODIURIL) 25 MG tablet Take 25 mg by mouth daily.     metoprolol tartrate (LOPRESSOR) 25 MG tablet Take 25 mg by mouth daily.     Multiple Vitamins-Minerals (CENTRUM SILVER 50+MEN) TABS Take 1 tablet by mouth daily.     lidocaine (LIDODERM) 5 % Place 1 patch onto the skin daily. Remove & Discard patch within 12 hours , use as needed for back pain (Patient not taking: Reported on 01/04/2022) 30 patch 0   No facility-administered medications prior to visit.    PAST MEDICAL HISTORY: Past  Medical History:  Diagnosis Date   Cerebrovascular disease    Diabetes mellitus without complication ([place])    Foley catheter in place    Headache    History of sepsis 12/27/2016   sepsis from prostate abscess post prostate bx   Hyperlipidemia    Hypertension    LBBB (left bundle branch block)    Metabolic encephalopathy    Myocardial infarction [facility])    Pre-diabetes    Prostate cancer, primary, 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 [facility]) urologist-  dr Tresa Moore   dx 07/ 2018 bx-- Gleason 8, PSA 400, vol 356m-- abd. lymphadenopathy   [place]ulmonary embolism ([facility]    [place] [place]    Rhabdomyolysis 06/10/2021   St[place] ([facility]    Thrombocytopenia ([facility]    TIA (transient ischemic attack)    Urine retention    intermittant since 2016    PAST SURGICAL HISTORY: Past Surgical History:  Procedure Laterality Date   NO PAST SURGERIES     ORCHIECTOMY Bilateral 01/31/2017   Procedure: ORCHIECTOMY;  Surgeon: MAlexis Frock MD;  [facility]  Service: Urology;  Laterality: Bilateral;   TONSILLECTOMY      FAMILY HISTORY: Family History  Problem Relation Age of Onset   Heart attack Father    Heart attack Brother     SOCIAL HISTORY: Social History   Socioeconomic History   Marital status: Significant Other    Spouse name: Not on file  Number of children: Not on file   Years of education: Not on file   Highest education level: Not on file  Occupational History   Not on file  Tobacco Use   Smoking status: Never   Smokeless tobacco: Never  Vaping Use   Vaping Use: Never used  Substance and Sexual Activity   Alcohol use: No   Drug use: No   Sexual activity: Not Currently  Other Topics Concern   Not on file  Social History Narrative   01/04/22 lives at [facility]   Social Determinants of Health   Financial Resource Strain: Low Risk  (04/05/2019)   Overall Financial Resource Strain (CARDIA)    Difficulty of Paying  Living Expenses: Not hard at all  Food Insecurity: [place] Present (10/09/2021)   Hunger Vital Sign    Worried About [place] in the Last Year: Often true    Ran Out of Food in the Last Year: Often true  Transportation Needs: Unmet Transportation Needs (10/09/2021)   PRAPARE - Hydrologist (Medical): Yes    Lack of Transportation (Non-Medical): Yes  Physical Activity: Inactive (04/05/2019)   Exercise Vital Sign    Days of Exercise per Week: 0 days    Minutes of Exercise per Session: 0 min  Stress: No Stress Concern Present (04/05/2019)   [place]    Feeling of Stress : Only a little  Social Connections: Unknown (04/05/2019)   Social Connection and Isolation Panel [NHANES]    Frequency of Communication with Friends and Family: Patient refused    Frequency of Social Gatherings with Friends and Family: Patient refused    Attends Religious Services: Patient refused    Active Member of Clubs or Organizations: Patient refused    Attends Archivist Meetings: Patient refused    Marital Status: Patient refused  Intimate Partner Violence: Not At Risk (04/05/2019)   Humiliation, Afraid, Rape, and Kick questionnaire    Fear of Current or Ex-Partner: No    Emotionally Abused: No    Physically Abused: No    Sexually Abused: No     PHYSICAL EXAM  GENERAL EXAM/CONSTITUTIONAL: Vitals:  Vitals:   01/04/22 1100  BP: 116/72  Pulse: 88   There is no height or weight on file to calculate BMI. Wt Readings from Last 3 Encounters:  11/09/21 233 lb (105.7 kg)  11/08/21 233 lb 11 oz (106 kg)  10/08/21 233 lb 11 oz (106 kg)   Patient is in no distress; well developed, nourished and groomed; neck is supple  CARDIOVASCULAR: Examination of carotid arteries is normal; no carotid bruits Regular rate and rhythm, no murmurs Examination of peripheral vascular system by observation  and palpation is normal  EYES: Ophthalmoscopic exam of optic discs and posterior segments is normal; no papilledema or hemorrhages No results found.  MUSCULOSKELETAL: Gait, strength, tone, movements noted in Neurologic exam below  NEUROLOGIC: MENTAL STATUS:      No data to display         awake, alert, oriented to person, place and time recent and remote memory intact normal attention and concentration language fluent, comprehension intact, naming intact fund of knowledge appropriate  CRANIAL NERVE:  2nd - no papilledema on fundoscopic exam 2nd, 3rd, 4th, 6th - pupils equal and reactive to light, visual fields full to confrontation, extraocular muscles intact, no nystagmus 5th - facial sensation symmetric 7th - facial strength  symmetric 8th - hearing intact 9th - palate elevates symmetrically, uvula midline 11th - shoulder shrug symmetric 12th - tongue protrusion midline  MOTOR:  INCREASED TONE IN LUE AND LLE; BUE 3-4; BLE 2-3  SENSORY:  normal and symmetric to light touch  COORDINATION:  finger-nose-finger, fine finger movements SLOW  REFLEXES:  deep tendon reflexes 1+; SLIGHTLY INCREASE ON LEFT  GAIT/STATION:  IN WHEELCHAIR     DIAGNOSTIC DATA (LABS, IMAGING, TESTING) - I reviewed patient records, labs, notes, testing and imaging myself where available.  Lab Results  Component Value Date   WBC 7.2 11/16/2021   HGB 12.0 (L) 11/16/2021   HCT 38.3 (L) 11/16/2021   MCV 69.4 (L) 11/16/2021   PLT 105 (L) 11/16/2021      Component Value Date/Time   NA 134 (L) 11/16/2021 0705   K 4.4 11/16/2021 0705   CL 99 11/16/2021 0705   CO2 26 11/16/2021 0705   GLUCOSE 122 (H) 11/16/2021 0705   BUN 21 11/16/2021 0705   CREATININE 0.91 11/16/2021 0705   CALCIUM 9.1 11/16/2021 0705   PROT 7.2 11/16/2021 0705   ALBUMIN 3.3 (L) 11/16/2021 0705   AST 109 (H) 11/16/2021 0705   ALT 25 11/16/2021 0705   ALKPHOS 181 (H) 11/16/2021 0705   BILITOT 0.6 11/16/2021 0705    GFRNONAA >60 11/16/2021 0705   GFRAA >60 04/07/2019 0319   Lab Results  Component Value Date   CHOL 211 (H) 09/01/2017   HDL 63 09/01/2017   LDLCALC 139 (H) 09/01/2017   TRIG 45 09/01/2017   CHOLHDL 3.3 09/01/2017   Lab Results  Component Value Date   HGBA1C 6.5 (H) 03/22/2019   Lab Results  Component Value Date   [id number] 656 07/25/2017   Lab Results  Component Value Date   TSH 0.671 06/10/2021    11/10/21 MRI brain  1. No evidence of acute intracranial abnormality. 2. Chronic microvascular ischemic disease and remote lacunar infarcts. 3. Similar chronic microhemorrhages, described above and potentially related to chronic hypertension and/or amyloid angiopathy. 4.  Cerebral atrophy (ICD10-G31.9).    ASSESSMENT AND PLAN  76 y.o. 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   Dx:  1. Weakness     PLAN:  PHYSICAL AND COGNITIVE DECLINE (post-stroke; left sided weakness; possible dementia) -  continue supportive care; consider palliative care consult  Return for return to PCP, pending if symptoms worsen or fail to improve.    Penni Bombard, MD 12/27/2749, 70:01 AM Certified in Neurology, Neurophysiology and Neuroimaging  [facility] [address], [place], [place] [address] [phone]

## 2022-01-05 ENCOUNTER — Encounter: Payer: Self-pay | Admitting: Diagnostic Neuroimaging

## 2022-09-27 DEATH — deceased
